# Patient Record
Sex: Male | Born: 1960 | Hispanic: Yes | Marital: Married | Smoking: Current every day smoker
Health system: Southern US, Community
[De-identification: ages and names within clinical notes are randomized; demographics above are authoritative.]

## PROBLEM LIST (undated history)

## (undated) DIAGNOSIS — J309 Allergic rhinitis, unspecified: Secondary | ICD-10-CM

## (undated) DIAGNOSIS — J45909 Unspecified asthma, uncomplicated: Secondary | ICD-10-CM

## (undated) DIAGNOSIS — M1990 Unspecified osteoarthritis, unspecified site: Secondary | ICD-10-CM

## (undated) DIAGNOSIS — C140 Malignant neoplasm of pharynx, unspecified: Secondary | ICD-10-CM

## (undated) DIAGNOSIS — F319 Bipolar disorder, unspecified: Secondary | ICD-10-CM

## (undated) DIAGNOSIS — B019 Varicella without complication: Secondary | ICD-10-CM

## (undated) DIAGNOSIS — F329 Major depressive disorder, single episode, unspecified: Secondary | ICD-10-CM

## (undated) DIAGNOSIS — M545 Low back pain, unspecified: Secondary | ICD-10-CM

## (undated) DIAGNOSIS — M436 Torticollis: Secondary | ICD-10-CM

## (undated) DIAGNOSIS — R569 Unspecified convulsions: Secondary | ICD-10-CM

## (undated) DIAGNOSIS — G4730 Sleep apnea, unspecified: Secondary | ICD-10-CM

## (undated) DIAGNOSIS — J449 Chronic obstructive pulmonary disease, unspecified: Secondary | ICD-10-CM

## (undated) DIAGNOSIS — M109 Gout, unspecified: Secondary | ICD-10-CM

## (undated) DIAGNOSIS — F32A Depression, unspecified: Secondary | ICD-10-CM

## (undated) HISTORY — DX: Allergic rhinitis, unspecified: J30.9

## (undated) HISTORY — DX: Gout, unspecified: M10.9

## (undated) HISTORY — DX: Depression, unspecified: F32.A

## (undated) HISTORY — PX: ROTATOR CUFF REPAIR: SHX139

## (undated) HISTORY — DX: Unspecified osteoarthritis, unspecified site: M19.90

## (undated) HISTORY — DX: Major depressive disorder, single episode, unspecified: F32.9

## (undated) HISTORY — PX: THROAT SURGERY: SHX803

## (undated) HISTORY — DX: Varicella without complication: B01.9

## (undated) HISTORY — DX: Malignant neoplasm of pharynx, unspecified: C14.0

## (undated) HISTORY — DX: Unspecified asthma, uncomplicated: J45.909

---

## 2013-08-08 DIAGNOSIS — M25519 Pain in unspecified shoulder: Secondary | ICD-10-CM | POA: Diagnosis not present

## 2013-08-13 DIAGNOSIS — M25519 Pain in unspecified shoulder: Secondary | ICD-10-CM | POA: Diagnosis not present

## 2013-08-15 DIAGNOSIS — M25519 Pain in unspecified shoulder: Secondary | ICD-10-CM | POA: Diagnosis not present

## 2013-08-19 DIAGNOSIS — M25519 Pain in unspecified shoulder: Secondary | ICD-10-CM | POA: Diagnosis not present

## 2013-08-22 DIAGNOSIS — M25519 Pain in unspecified shoulder: Secondary | ICD-10-CM | POA: Diagnosis not present

## 2013-08-27 DIAGNOSIS — M25519 Pain in unspecified shoulder: Secondary | ICD-10-CM | POA: Diagnosis not present

## 2013-08-29 DIAGNOSIS — M25519 Pain in unspecified shoulder: Secondary | ICD-10-CM | POA: Diagnosis not present

## 2013-09-03 DIAGNOSIS — M25519 Pain in unspecified shoulder: Secondary | ICD-10-CM | POA: Diagnosis not present

## 2013-09-23 DIAGNOSIS — J069 Acute upper respiratory infection, unspecified: Secondary | ICD-10-CM | POA: Diagnosis not present

## 2013-09-23 DIAGNOSIS — Z6832 Body mass index (BMI) 32.0-32.9, adult: Secondary | ICD-10-CM | POA: Diagnosis not present

## 2013-09-23 DIAGNOSIS — R05 Cough: Secondary | ICD-10-CM | POA: Diagnosis not present

## 2013-09-23 DIAGNOSIS — R059 Cough, unspecified: Secondary | ICD-10-CM | POA: Diagnosis not present

## 2014-01-01 DIAGNOSIS — E785 Hyperlipidemia, unspecified: Secondary | ICD-10-CM | POA: Diagnosis not present

## 2014-01-01 DIAGNOSIS — R319 Hematuria, unspecified: Secondary | ICD-10-CM | POA: Diagnosis not present

## 2014-01-01 DIAGNOSIS — D72829 Elevated white blood cell count, unspecified: Secondary | ICD-10-CM | POA: Diagnosis not present

## 2014-01-01 DIAGNOSIS — R569 Unspecified convulsions: Secondary | ICD-10-CM | POA: Diagnosis not present

## 2014-01-01 DIAGNOSIS — R062 Wheezing: Secondary | ICD-10-CM | POA: Diagnosis not present

## 2014-01-01 DIAGNOSIS — R7301 Impaired fasting glucose: Secondary | ICD-10-CM | POA: Diagnosis not present

## 2014-01-01 DIAGNOSIS — Z125 Encounter for screening for malignant neoplasm of prostate: Secondary | ICD-10-CM | POA: Diagnosis not present

## 2014-01-01 DIAGNOSIS — R3911 Hesitancy of micturition: Secondary | ICD-10-CM | POA: Diagnosis not present

## 2014-01-01 DIAGNOSIS — Z6832 Body mass index (BMI) 32.0-32.9, adult: Secondary | ICD-10-CM | POA: Diagnosis not present

## 2014-01-01 DIAGNOSIS — Z8669 Personal history of other diseases of the nervous system and sense organs: Secondary | ICD-10-CM | POA: Diagnosis not present

## 2014-01-09 DIAGNOSIS — Z6832 Body mass index (BMI) 32.0-32.9, adult: Secondary | ICD-10-CM | POA: Diagnosis not present

## 2014-01-09 DIAGNOSIS — E785 Hyperlipidemia, unspecified: Secondary | ICD-10-CM | POA: Diagnosis not present

## 2014-01-09 DIAGNOSIS — R7301 Impaired fasting glucose: Secondary | ICD-10-CM | POA: Diagnosis not present

## 2014-01-22 DIAGNOSIS — R7309 Other abnormal glucose: Secondary | ICD-10-CM | POA: Diagnosis not present

## 2014-01-22 DIAGNOSIS — H6120 Impacted cerumen, unspecified ear: Secondary | ICD-10-CM | POA: Diagnosis not present

## 2014-02-17 DIAGNOSIS — D3705 Neoplasm of uncertain behavior of pharynx: Secondary | ICD-10-CM | POA: Diagnosis not present

## 2014-02-17 DIAGNOSIS — D3701 Neoplasm of uncertain behavior of lip: Secondary | ICD-10-CM | POA: Diagnosis not present

## 2014-02-21 DIAGNOSIS — K148 Other diseases of tongue: Secondary | ICD-10-CM | POA: Diagnosis not present

## 2014-02-21 DIAGNOSIS — J387 Other diseases of larynx: Secondary | ICD-10-CM | POA: Diagnosis not present

## 2014-03-10 DIAGNOSIS — H9319 Tinnitus, unspecified ear: Secondary | ICD-10-CM | POA: Diagnosis not present

## 2014-03-10 DIAGNOSIS — K148 Other diseases of tongue: Secondary | ICD-10-CM | POA: Diagnosis not present

## 2014-03-10 DIAGNOSIS — H905 Unspecified sensorineural hearing loss: Secondary | ICD-10-CM | POA: Diagnosis not present

## 2014-03-10 DIAGNOSIS — J387 Other diseases of larynx: Secondary | ICD-10-CM | POA: Diagnosis not present

## 2014-03-10 DIAGNOSIS — H903 Sensorineural hearing loss, bilateral: Secondary | ICD-10-CM | POA: Diagnosis not present

## 2014-03-10 DIAGNOSIS — H9190 Unspecified hearing loss, unspecified ear: Secondary | ICD-10-CM | POA: Diagnosis not present

## 2014-04-04 DIAGNOSIS — R059 Cough, unspecified: Secondary | ICD-10-CM | POA: Diagnosis not present

## 2014-04-04 DIAGNOSIS — R05 Cough: Secondary | ICD-10-CM | POA: Diagnosis not present

## 2014-04-04 DIAGNOSIS — R490 Dysphonia: Secondary | ICD-10-CM | POA: Diagnosis not present

## 2014-04-04 DIAGNOSIS — J449 Chronic obstructive pulmonary disease, unspecified: Secondary | ICD-10-CM | POA: Diagnosis not present

## 2014-04-04 DIAGNOSIS — J209 Acute bronchitis, unspecified: Secondary | ICD-10-CM | POA: Diagnosis not present

## 2014-04-11 DIAGNOSIS — J449 Chronic obstructive pulmonary disease, unspecified: Secondary | ICD-10-CM | POA: Diagnosis not present

## 2014-04-11 DIAGNOSIS — G2581 Restless legs syndrome: Secondary | ICD-10-CM | POA: Diagnosis not present

## 2014-04-11 DIAGNOSIS — F319 Bipolar disorder, unspecified: Secondary | ICD-10-CM | POA: Diagnosis not present

## 2014-04-11 DIAGNOSIS — G4733 Obstructive sleep apnea (adult) (pediatric): Secondary | ICD-10-CM | POA: Diagnosis not present

## 2014-04-11 DIAGNOSIS — E669 Obesity, unspecified: Secondary | ICD-10-CM | POA: Diagnosis not present

## 2014-04-18 DIAGNOSIS — R498 Other voice and resonance disorders: Secondary | ICD-10-CM | POA: Diagnosis not present

## 2014-04-18 DIAGNOSIS — J019 Acute sinusitis, unspecified: Secondary | ICD-10-CM | POA: Diagnosis not present

## 2014-04-18 DIAGNOSIS — R070 Pain in throat: Secondary | ICD-10-CM | POA: Diagnosis not present

## 2014-04-18 DIAGNOSIS — D101 Benign neoplasm of tongue: Secondary | ICD-10-CM | POA: Diagnosis not present

## 2014-04-30 DIAGNOSIS — M47817 Spondylosis without myelopathy or radiculopathy, lumbosacral region: Secondary | ICD-10-CM | POA: Diagnosis not present

## 2014-04-30 DIAGNOSIS — J439 Emphysema, unspecified: Secondary | ICD-10-CM | POA: Diagnosis not present

## 2014-04-30 DIAGNOSIS — M542 Cervicalgia: Secondary | ICD-10-CM | POA: Diagnosis not present

## 2014-05-01 DIAGNOSIS — J449 Chronic obstructive pulmonary disease, unspecified: Secondary | ICD-10-CM | POA: Diagnosis not present

## 2014-05-05 DIAGNOSIS — C029 Malignant neoplasm of tongue, unspecified: Secondary | ICD-10-CM | POA: Diagnosis not present

## 2014-05-05 DIAGNOSIS — K219 Gastro-esophageal reflux disease without esophagitis: Secondary | ICD-10-CM | POA: Diagnosis not present

## 2014-05-28 DIAGNOSIS — E669 Obesity, unspecified: Secondary | ICD-10-CM | POA: Diagnosis not present

## 2014-05-28 DIAGNOSIS — Z01811 Encounter for preprocedural respiratory examination: Secondary | ICD-10-CM | POA: Diagnosis not present

## 2014-05-28 DIAGNOSIS — J449 Chronic obstructive pulmonary disease, unspecified: Secondary | ICD-10-CM | POA: Diagnosis not present

## 2014-05-28 DIAGNOSIS — G4733 Obstructive sleep apnea (adult) (pediatric): Secondary | ICD-10-CM | POA: Diagnosis not present

## 2014-09-02 DIAGNOSIS — G4733 Obstructive sleep apnea (adult) (pediatric): Secondary | ICD-10-CM | POA: Diagnosis not present

## 2014-09-02 DIAGNOSIS — Z87891 Personal history of nicotine dependence: Secondary | ICD-10-CM | POA: Diagnosis not present

## 2014-09-02 DIAGNOSIS — Z01811 Encounter for preprocedural respiratory examination: Secondary | ICD-10-CM | POA: Diagnosis not present

## 2014-09-02 DIAGNOSIS — J449 Chronic obstructive pulmonary disease, unspecified: Secondary | ICD-10-CM | POA: Diagnosis not present

## 2014-12-24 DIAGNOSIS — M791 Myalgia: Secondary | ICD-10-CM | POA: Diagnosis not present

## 2015-10-07 ENCOUNTER — Encounter: Payer: Self-pay | Admitting: Family Medicine

## 2015-10-07 ENCOUNTER — Ambulatory Visit (INDEPENDENT_AMBULATORY_CARE_PROVIDER_SITE_OTHER): Payer: Medicare Other | Admitting: Family Medicine

## 2015-10-07 VITALS — BP 112/68 | HR 84 | Temp 97.9°F | Ht 68.5 in | Wt 210.0 lb

## 2015-10-07 DIAGNOSIS — F419 Anxiety disorder, unspecified: Secondary | ICD-10-CM

## 2015-10-07 DIAGNOSIS — R252 Cramp and spasm: Secondary | ICD-10-CM | POA: Diagnosis not present

## 2015-10-07 DIAGNOSIS — R1310 Dysphagia, unspecified: Secondary | ICD-10-CM | POA: Diagnosis not present

## 2015-10-07 DIAGNOSIS — K219 Gastro-esophageal reflux disease without esophagitis: Secondary | ICD-10-CM

## 2015-10-07 DIAGNOSIS — M542 Cervicalgia: Secondary | ICD-10-CM

## 2015-10-07 DIAGNOSIS — R490 Dysphonia: Secondary | ICD-10-CM

## 2015-10-07 LAB — CBC
HCT: 43.2 % (ref 39.0–52.0)
HEMOGLOBIN: 14.8 g/dL (ref 13.0–17.0)
MCHC: 34.3 g/dL (ref 30.0–36.0)
MCV: 88.8 fl (ref 78.0–100.0)
Platelets: 290 10*3/uL (ref 150.0–400.0)
RBC: 4.86 Mil/uL (ref 4.22–5.81)
RDW: 12.3 % (ref 11.5–15.5)
WBC: 8.4 10*3/uL (ref 4.0–10.5)

## 2015-10-07 LAB — COMPREHENSIVE METABOLIC PANEL
ALK PHOS: 77 U/L (ref 39–117)
ALT: 24 U/L (ref 0–53)
AST: 39 U/L — ABNORMAL HIGH (ref 0–37)
Albumin: 4.2 g/dL (ref 3.5–5.2)
BILIRUBIN TOTAL: 0.4 mg/dL (ref 0.2–1.2)
BUN: 10 mg/dL (ref 6–23)
CALCIUM: 8.8 mg/dL (ref 8.4–10.5)
CO2: 29 meq/L (ref 19–32)
Chloride: 103 mEq/L (ref 96–112)
Creatinine, Ser: 0.96 mg/dL (ref 0.40–1.50)
GFR: 85.79 mL/min (ref >60.00)
Glucose, Bld: 97 mg/dL (ref 70–99)
Potassium: 4.1 mEq/L (ref 3.5–5.1)
Sodium: 138 mEq/L (ref 135–145)
Total Protein: 6.7 g/dL (ref 6.0–8.3)

## 2015-10-07 LAB — TSH: TSH: 0.53 u[IU]/mL (ref 0.35–4.50)

## 2015-10-07 LAB — MAGNESIUM: Magnesium: 2.4 mg/dL (ref 1.5–2.5)

## 2015-10-07 MED ORDER — PANTOPRAZOLE SODIUM 40 MG PO TBEC: 40.0000 mg | DELAYED_RELEASE_TABLET | Freq: Two times a day (BID) | ORAL | Status: DC

## 2015-10-07 NOTE — Patient Instructions (Addendum)
Nice to meet you. We are going to request records from the [facility] to see if he can get copies of your x-ray of your neck. Excellent we are going to refer you to your nose and throat for evaluation of your hoarseness. We're going to change her reflux medicine to Protonix twice daily. Please avoid foods that cause her reflux. You can take Tylenol for her neck discomfort. Patient was initially advised to take ibuprofen, though he reported prior GI issues with this and he has reflux so he is advised not to take this. If you develop inability to swallow, chest pain, shortness of breath, numbness, weakness, worsening pain, or any new or changing symptoms please seek medical attention.

## 2015-10-07 NOTE — Progress Notes (Signed)
Pre visit review using our clinic review tool, if applicable. No additional management support is needed unless otherwise documented below in the visit note. 

## 2015-10-08 ENCOUNTER — Encounter: Payer: Self-pay | Admitting: Family Medicine

## 2015-10-08 DIAGNOSIS — M542 Cervicalgia: Secondary | ICD-10-CM

## 2015-10-08 DIAGNOSIS — F411 Generalized anxiety disorder: Secondary | ICD-10-CM | POA: Insufficient documentation

## 2015-10-08 DIAGNOSIS — R490 Dysphonia: Secondary | ICD-10-CM | POA: Insufficient documentation

## 2015-10-08 DIAGNOSIS — F419 Anxiety disorder, unspecified: Secondary | ICD-10-CM | POA: Insufficient documentation

## 2015-10-08 DIAGNOSIS — K219 Gastro-esophageal reflux disease without esophagitis: Secondary | ICD-10-CM | POA: Insufficient documentation

## 2015-10-08 DIAGNOSIS — R252 Cramp and spasm: Secondary | ICD-10-CM | POA: Insufficient documentation

## 2015-10-08 DIAGNOSIS — G8929 Other chronic pain: Secondary | ICD-10-CM | POA: Insufficient documentation

## 2015-10-08 HISTORY — DX: Generalized anxiety disorder: F41.1

## 2015-10-08 HISTORY — DX: Gastro-esophageal reflux disease without esophagitis: K21.9

## 2015-10-08 HISTORY — DX: Cervicalgia: M54.2

## 2015-10-08 HISTORY — DX: Other chronic pain: G89.29

## 2015-10-08 NOTE — Assessment & Plan Note (Signed)
Advise stay well hydrated. We'll check lab work to evaluate this further.

## 2015-10-08 NOTE — Assessment & Plan Note (Addendum)
Patient had persistent and worsening hoarseness of his voice over the last 6-7 months. He notes some intermittent sensation of food sticking as well. He does have what appears to be uncontrolled reflux that could be contributing to this. More concerning is his report of prior cancer in his throat and smoking history. There are no obvious abnormalities today on exam, other than that he is hoarse. I discussed obtaining imaging to evaluate further, though we decided to refer to ENT for further evaluation of this issue. He is given return precautions.

## 2015-10-08 NOTE — Assessment & Plan Note (Signed)
This appears to be uncontrolled on his current regimen. Sounds as though he is taking Prilosec for this. Benign abdominal exam. We will refer to GI given his sensation of food sticking and uncontrolled reflux. We will change him to Protonix 40 mg twice daily. He will continue to monitor. He is given return precautions.

## 2015-10-08 NOTE — Assessment & Plan Note (Signed)
Well-controlled. Managed by the [facility]. Patient will continue to follow with them. He is given return precautions.

## 2015-10-08 NOTE — Assessment & Plan Note (Addendum)
Patient with a number of months of neck pain. Benign exam today. Neurologically intact. We will request the records from the [facility] regarding this. His neck discomfort could be coming from musculoskeletal cause or given his hoarseness could be coming from potential throat issue. Tylenol for discomfort. He'll continue to monitor. He is given return precautions.

## 2015-10-08 NOTE — Progress Notes (Signed)
Patient ID: Patrick Gregory, male   DOB: 04/04/1961, 55 y.o.   MRN: [id number]  [name], MD Phone: [phone]  Patrick Gregory is a 55 y.o. male who presents today for new patient visit.  Patient notes for the last 6-7 months he's had issues with hoarseness. He has also been coughing frequently. He notes some lateral neck pain on the left side with this. Sore throat as well. Cough is nonproductive. Notes occasionally will have pain radiating down his right arm from his neck. No numbness or weakness or tingling. He notes some mild sticking sensation on swallowing. No regurgitation. He is always able to swallow fully. He does have a history of reflux and takes some type of PPI of which she is taking 40 mg twice daily. He does get reflux symptoms at night. He notes no chest pain or shortness of breath with any of this. He does report a history of throat cancer in 2007. He does have a history of smoking. He notes muscle cramps throughout his body at random times and locations. He reports having had an x-ray of his neck done at the [facility] about a month ago of which he has not gotten the records yet. He does note he has been advised by the [facility] that he would need neck surgery though he does not want to go through with this at this time.  Anxiety/bipolar disorder: Patient notes this is stable on his current medication regimen which she does not know the name of medications. [facility] He denies SI and HI.  Active Ambulatory Problems    Diagnosis Date Noted  . Hoarseness 10/08/2015  . GERD (gastroesophageal reflux disease) 10/08/2015  . Neck pain 10/08/2015  . Anxiety 10/08/2015  . Muscle cramps 10/08/2015   Resolved Ambulatory Problems    Diagnosis Date Noted  . No Resolved Ambulatory Problems   Past Medical History  Diagnosis Date  . Asthma   . Arthritis   . Throat cancer ([place])   . Chickenpox   . Depression   . Allergic rhinitis     Family History  Problem Relation Age of Onset  .  Alcoholism    . Arthritis    . Hyperlipidemia    . Heart disease    . Sudden death    . Kidney disease    . Mental illness      Social History   Social History  . Marital Status: Single    Spouse Name: N/A  . Number of Children: N/A  . Years of Education: N/A   Occupational History  . Not on file.   Social History Main Topics  . Smoking status: Former Research scientist (life sciences)  . Smokeless tobacco: Not on file  . Alcohol Use: No  . Drug Use: Yes  . Sexual Activity: Not on file   Other Topics Concern  . Not on file   Social History Narrative    ROS   General:  Negative for nexplained weight loss, fever Skin: Negative for new or changing mole, sore that won't heal HEENT: Negative for trouble hearing, trouble seeing, ringing in ears, mouth sores, hoarseness, change in voice, dysphagia. CV:  Negative for chest pain, dyspnea, edema, palpitations Resp: Positive for cough, negative for dyspnea, hemoptysis GI: Negative for nausea, vomiting, diarrhea, constipation, abdominal pain, melena, hematochezia. GU: Positive for sexual difficulty, Negative for dysuria, incontinence, urinary hesitance, hematuria, vaginal or penile discharge, polyuria, lumps in testicle or breasts MSK: Positive for muscle cramps, Negative for muscle aches, joint  pain or swelling Neuro: Negative for headaches, weakness, numbness, dizziness, passing out/fainting Psych: Positive for anxiety, stress, Negative for depression, memory problems  Objective  Physical Exam Filed Vitals:   10/07/15 1020  BP: 112/68  Pulse: 84  Temp: 97.9 F (36.6 C)    BP Readings from Last 3 Encounters:  10/07/15 112/68   Wt Readings from Last 3 Encounters:  10/07/15 210 lb (95.255 kg)    Physical Exam  Constitutional: He is well-developed, well-nourished, and in no distress.  HENT:  Head: Normocephalic and atraumatic.  Right Ear: External ear normal.  Left Ear: External ear normal.  Mouth/Throat: Oropharynx is clear and moist. No  oropharyngeal exudate.  Eyes: Conjunctivae are normal. Pupils are equal, round, and reactive to light.  Neck: Normal range of motion. Neck supple.  Mild left-sided muscular neck tenderness, no swelling or cervical adenopathy, no masses palpated  Cardiovascular: Normal rate, regular rhythm and normal heart sounds.   Pulmonary/Chest: Effort normal and breath sounds normal.  Abdominal: Soft. Bowel sounds are normal. He exhibits no distension. There is no tenderness. There is no rebound and no guarding.  Musculoskeletal:  No midline spine tenderness, no midline spine step-off, no muscular back tenderness  Lymphadenopathy:    He has no cervical adenopathy.  Neurological: He is alert.  CN 2-12 intact, 5/5 strength in bilateral biceps, triceps, grip, quads, hamstrings, plantar and dorsiflexion, sensation to light touch intact in bilateral UE and LE, normal gait, 2+ patellar reflexes  Skin: Skin is warm and dry. He is not diaphoretic.  Psychiatric: Mood and affect normal.     Assessment/Plan:   Hoarseness Patient had persistent and worsening hoarseness of his voice over the last 6-7 months. He notes some intermittent sensation of food sticking as well. He does have what appears to be uncontrolled reflux that could be contributing to this. More concerning is his report of prior cancer in his throat and smoking history. There are no obvious abnormalities today on exam, other than that he is hoarse. I discussed obtaining imaging to evaluate further, though we decided to refer to ENT for further evaluation of this issue. He is given return precautions.  GERD (gastroesophageal reflux disease) This appears to be uncontrolled on his current regimen. Sounds as though he is taking Prilosec for this. Benign abdominal exam. We will refer to GI given his sensation of food sticking and uncontrolled reflux. We will change him to Protonix 40 mg twice daily. He will continue to monitor. He is given return  precautions.  Neck pain Patient with a number of months of neck pain. Benign exam today. Neurologically intact. We will request the records from the [facility] regarding this. His neck discomfort could be coming from musculoskeletal cause or given his hoarseness could be coming from potential throat issue. Tylenol for discomfort. He'll continue to monitor. He is given return precautions.  Anxiety Well-controlled. Managed by the [facility]. Patient will continue to follow with them. He is given return precautions.  Muscle cramps Advise stay well hydrated. We'll check lab work to evaluate this further.    Orders Placed This Encounter  Procedures  . Comp Met (CMET)  . Magnesium  . TSH  . CBC  . Ambulatory referral to ENT    Referral Priority:  Routine    Referral Type:  Consultation    Referral Reason:  Specialty Services Required    Requested Specialty:  Otolaryngology    Number of Visits Requested:  1  . Ambulatory referral to Gastroenterology  Referral Priority:  Routine    Referral Type:  Consultation    Referral Reason:  Specialty Services Required    Number of Visits Requested:  1    Meds ordered this encounter  Medications  . DISCONTD: pantoprazole (PROTONIX) 40 MG tablet    Sig: Take 1 tablet (40 mg total) by mouth 2 (two) times daily before a meal.    Dispense:  30 tablet    Refill:  3  . pantoprazole (PROTONIX) 40 MG tablet    Sig: Take 1 tablet (40 mg total) by mouth 2 (two) times daily before a meal.    Dispense:  60 tablet    Refill:  [place], MD [place]

## 2015-10-14 ENCOUNTER — Encounter: Payer: Self-pay | Admitting: Surgical

## 2015-10-16 ENCOUNTER — Other Ambulatory Visit: Payer: TRICARE For Life (TFL)

## 2015-10-19 ENCOUNTER — Telehealth: Payer: Self-pay | Admitting: *Deleted

## 2015-10-19 ENCOUNTER — Other Ambulatory Visit (INDEPENDENT_AMBULATORY_CARE_PROVIDER_SITE_OTHER): Payer: Medicare Other

## 2015-10-19 DIAGNOSIS — R945 Abnormal results of liver function studies: Principal | ICD-10-CM

## 2015-10-19 DIAGNOSIS — R7989 Other specified abnormal findings of blood chemistry: Secondary | ICD-10-CM

## 2015-10-19 NOTE — Telephone Encounter (Signed)
Orders placed.

## 2015-10-19 NOTE — Telephone Encounter (Signed)
Labs and dx?  

## 2015-10-20 DIAGNOSIS — R490 Dysphonia: Secondary | ICD-10-CM | POA: Diagnosis not present

## 2015-10-20 DIAGNOSIS — D380 Neoplasm of uncertain behavior of larynx: Secondary | ICD-10-CM | POA: Diagnosis not present

## 2015-10-20 LAB — COMPREHENSIVE METABOLIC PANEL WITH GFR
ALT: 15 U/L (ref 0–53)
AST: 23 U/L (ref 0–37)
Albumin: 4.4 g/dL (ref 3.5–5.2)
Alkaline Phosphatase: 72 U/L (ref 39–117)
BUN: 9 mg/dL (ref 6–23)
CO2: 32 meq/L (ref 19–32)
Calcium: 9.3 mg/dL (ref 8.4–10.5)
Chloride: 102 meq/L (ref 96–112)
Creatinine, Ser: 1.07 mg/dL (ref 0.40–1.50)
GFR: 75.68 mL/min
Glucose, Bld: 71 mg/dL (ref 70–99)
Potassium: 4.4 meq/L (ref 3.5–5.1)
Sodium: 138 meq/L (ref 135–145)
Total Bilirubin: 0.4 mg/dL (ref 0.2–1.2)
Total Protein: 7.2 g/dL (ref 6.0–8.3)

## 2015-10-21 ENCOUNTER — Encounter: Payer: Self-pay | Admitting: *Deleted

## 2015-10-21 ENCOUNTER — Encounter: Payer: Self-pay | Admitting: Family Medicine

## 2015-10-22 NOTE — Discharge Instructions (Signed)

## 2015-10-23 ENCOUNTER — Encounter
Admission: RE | Disposition: A | Discharge status: Home / Self Care | Payer: Self-pay | Source: Ambulatory Visit | Attending: Unknown Physician Specialty

## 2015-10-23 ENCOUNTER — Ambulatory Visit
Admission: RE | Admit: 2015-10-23 | Discharge: 2015-10-23 | Disposition: A | Discharge status: Home / Self Care | Payer: Medicare Other | Source: Ambulatory Visit | Attending: Unknown Physician Specialty | Admitting: Unknown Physician Specialty

## 2015-10-23 ENCOUNTER — Ambulatory Visit: Payer: Medicare Other | Admitting: Anesthesiology

## 2015-10-23 DIAGNOSIS — R490 Dysphonia: Secondary | ICD-10-CM | POA: Diagnosis not present

## 2015-10-23 DIAGNOSIS — K219 Gastro-esophageal reflux disease without esophagitis: Secondary | ICD-10-CM | POA: Diagnosis not present

## 2015-10-23 DIAGNOSIS — Z811 Family history of alcohol abuse and dependence: Secondary | ICD-10-CM | POA: Diagnosis not present

## 2015-10-23 DIAGNOSIS — R569 Unspecified convulsions: Secondary | ICD-10-CM | POA: Diagnosis not present

## 2015-10-23 DIAGNOSIS — J382 Nodules of vocal cords: Secondary | ICD-10-CM | POA: Diagnosis not present

## 2015-10-23 DIAGNOSIS — J381 Polyp of vocal cord and larynx: Secondary | ICD-10-CM | POA: Diagnosis not present

## 2015-10-23 DIAGNOSIS — G4730 Sleep apnea, unspecified: Secondary | ICD-10-CM | POA: Diagnosis not present

## 2015-10-23 DIAGNOSIS — Z818 Family history of other mental and behavioral disorders: Secondary | ICD-10-CM | POA: Diagnosis not present

## 2015-10-23 DIAGNOSIS — Z7722 Contact with and (suspected) exposure to environmental tobacco smoke (acute) (chronic): Secondary | ICD-10-CM | POA: Diagnosis not present

## 2015-10-23 DIAGNOSIS — J383 Other diseases of vocal cords: Secondary | ICD-10-CM | POA: Diagnosis not present

## 2015-10-23 DIAGNOSIS — Z87891 Personal history of nicotine dependence: Secondary | ICD-10-CM | POA: Diagnosis not present

## 2015-10-23 DIAGNOSIS — Z8249 Family history of ischemic heart disease and other diseases of the circulatory system: Secondary | ICD-10-CM | POA: Diagnosis not present

## 2015-10-23 HISTORY — DX: Chronic obstructive pulmonary disease, unspecified: J44.9

## 2015-10-23 HISTORY — PX: DIRECT LARYNGOSCOPY: SHX5326

## 2015-10-23 HISTORY — DX: Bipolar disorder, unspecified: F31.9

## 2015-10-23 HISTORY — DX: Torticollis: M43.6

## 2015-10-23 HISTORY — DX: Unspecified convulsions: R56.9

## 2015-10-23 HISTORY — DX: Low back pain: M54.5

## 2015-10-23 HISTORY — DX: Sleep apnea, unspecified: G47.30

## 2015-10-23 HISTORY — DX: Low back pain, unspecified: M54.50

## 2015-10-23 SURGERY — LARYNGOSCOPY, DIRECT
Anesthesia: General | Site: Throat | Laterality: Right | Wound class: Clean Contaminated

## 2015-10-23 MED ORDER — ALBUTEROL SULFATE HFA 108 (90 BASE) MCG/ACT IN AERS
INHALATION_SPRAY | RESPIRATORY_TRACT | Status: DC | PRN
  Administered 2015-10-23: 4 via RESPIRATORY_TRACT

## 2015-10-23 MED ORDER — LIDOCAINE HCL (CARDIAC) 20 MG/ML IV SOLN
INTRAVENOUS | Status: DC | PRN
  Administered 2015-10-23: 50 mg via INTRAVENOUS

## 2015-10-23 MED ORDER — ACETAMINOPHEN 160 MG/5ML PO SOLN: 325.0000 mg | ORAL | Status: DC | PRN

## 2015-10-23 MED ORDER — ONDANSETRON HCL 4 MG/2ML IJ SOLN
INTRAMUSCULAR | Status: DC | PRN
  Administered 2015-10-23: 4 mg via INTRAVENOUS

## 2015-10-23 MED ORDER — FENTANYL CITRATE (PF) 100 MCG/2ML IJ SOLN: 25.0000 ug | INTRAMUSCULAR | Status: DC | PRN

## 2015-10-23 MED ORDER — LACTATED RINGERS IV SOLN: 500.0000 mL | INTRAVENOUS | Status: DC

## 2015-10-23 MED ORDER — SUCCINYLCHOLINE CHLORIDE 20 MG/ML IJ SOLN
INTRAMUSCULAR | Status: DC | PRN
  Administered 2015-10-23: 100 mg via INTRAVENOUS

## 2015-10-23 MED ORDER — LACTATED RINGERS IV SOLN
INTRAVENOUS | Status: DC
  Administered 2015-10-23 (×2): via INTRAVENOUS

## 2015-10-23 MED ORDER — ROCURONIUM BROMIDE 100 MG/10ML IV SOLN
INTRAVENOUS | Status: DC | PRN
  Administered 2015-10-23: 15 mg via INTRAVENOUS

## 2015-10-23 MED ORDER — ACETAMINOPHEN 325 MG PO TABS: 325.0000 mg | ORAL_TABLET | ORAL | Status: DC | PRN

## 2015-10-23 MED ORDER — OXYCODONE HCL 5 MG PO TABS: 5.0000 mg | ORAL_TABLET | Freq: Once | ORAL | Status: AC | PRN

## 2015-10-23 MED ORDER — PROPOFOL 10 MG/ML IV BOLUS
INTRAVENOUS | Status: DC | PRN
  Administered 2015-10-23: 50 mg via INTRAVENOUS
  Administered 2015-10-23: 150 mg via INTRAVENOUS
  Administered 2015-10-23: 50 mg via INTRAVENOUS

## 2015-10-23 MED ORDER — ONDANSETRON HCL 4 MG/2ML IJ SOLN: 4.0000 mg | Freq: Once | INTRAMUSCULAR | Status: DC | PRN

## 2015-10-23 MED ORDER — OXYCODONE HCL 5 MG/5ML PO SOLN
5.0000 mg | Freq: Once | ORAL | Status: AC | PRN
  Administered 2015-10-23: 5 mg via ORAL

## 2015-10-23 MED ORDER — NEOSTIGMINE METHYLSULFATE 10 MG/10ML IV SOLN
INTRAVENOUS | Status: DC | PRN
  Administered 2015-10-23: 3 mg via INTRAVENOUS

## 2015-10-23 MED ORDER — MIDAZOLAM HCL 5 MG/5ML IJ SOLN
INTRAMUSCULAR | Status: DC | PRN
  Administered 2015-10-23: 2 mg via INTRAVENOUS

## 2015-10-23 MED ORDER — GLYCOPYRROLATE 0.2 MG/ML IJ SOLN
INTRAMUSCULAR | Status: DC | PRN
  Administered 2015-10-23: 0.2 mg via INTRAVENOUS
  Administered 2015-10-23: .4 mg via INTRAVENOUS

## 2015-10-23 MED ORDER — PHENYLEPHRINE HCL 0.5 % NA SOLN
NASAL | Status: DC | PRN
  Administered 2015-10-23: 30 mL via TOPICAL

## 2015-10-23 MED ORDER — LIDOCAINE HCL 4 % MT SOLN
OROMUCOSAL | Status: DC | PRN
  Administered 2015-10-23: 4 mL via TOPICAL

## 2015-10-23 MED ORDER — DEXAMETHASONE SODIUM PHOSPHATE 4 MG/ML IJ SOLN
INTRAMUSCULAR | Status: DC | PRN
  Administered 2015-10-23: 4 mg via INTRAVENOUS

## 2015-10-23 MED ORDER — OXYCODONE-ACETAMINOPHEN 5-325 MG PO TABS: 1.0000 | ORAL_TABLET | ORAL | Status: DC | PRN

## 2015-10-23 SURGICAL SUPPLY — 19 items
BLOCK BITE GUARD (MISCELLANEOUS) ×3 IMPLANT
COVER MAYO STAND STRL (DRAPES) ×3 IMPLANT
COVER TABLE BACK 60X90 (DRAPES) ×3 IMPLANT
CUP MEDICINE 2OZ PLAST GRAD ST (MISCELLANEOUS) ×3 IMPLANT
DRESSING TELFA 4X3 1S ST N-ADH (GAUZE/BANDAGES/DRESSINGS) ×3 IMPLANT
GLOVE BIO SURGEON STRL SZ7.5 (GLOVE) ×6 IMPLANT
KIT ROOM TURNOVER OR (KITS) ×3 IMPLANT
MARKER SKIN DUAL TIP RULER LAB (MISCELLANEOUS) ×3 IMPLANT
NEEDLE FILTER BLUNT 18X 1/2SAF (NEEDLE) ×2
NEEDLE FILTER BLUNT 18X1 1/2 (NEEDLE) ×1 IMPLANT
NS IRRIG 500ML POUR BTL (IV SOLUTION) ×3 IMPLANT
PATTIES SURGICAL .5 X.5 (GAUZE/BANDAGES/DRESSINGS) ×3 IMPLANT
SOL ANTI-FOG 6CC FOG-OUT (MISCELLANEOUS) ×1 IMPLANT
SOL FOG-OUT ANTI-FOG 6CC (MISCELLANEOUS) ×2
SPONGE XRAY 4X4 16PLY STRL (MISCELLANEOUS) ×3 IMPLANT
STRAP BODY AND KNEE 60X3 (MISCELLANEOUS) ×6 IMPLANT
TOWEL OR 17X26 4PK STRL BLUE (TOWEL DISPOSABLE) ×3 IMPLANT
TUBING CONN 6MMX3.1M (TUBING) ×2
TUBING SUCTION CONN 0.25 STRL (TUBING) ×1 IMPLANT

## 2015-10-23 NOTE — Transfer of Care (Signed)
Immediate Anesthesia Transfer of Care Note  Patient: Patrick Gregory  Procedure(s) Performed: Procedure(s): MICO DIRECT LARYNGOSCOPY WITH EXCISION RIGHT VOCAL CORD LESION (Right)  Patient Location: PACU  Anesthesia Type: General  Level of Consciousness: awake, alert  and patient cooperative  Airway and Oxygen Therapy: Patient Spontanous Breathing and Patient connected to supplemental oxygen  Post-op Assessment: Post-op Vital signs reviewed, Patient's Cardiovascular Status Stable, Respiratory Function Stable, Patent Airway and No signs of Nausea or vomiting  Post-op Vital Signs: Reviewed and stable  Complications: No apparent anesthesia complications

## 2015-10-23 NOTE — Anesthesia Postprocedure Evaluation (Signed)
Anesthesia Post Note  Patient: Patrick Gregory  Procedure(s) Performed: Procedure(s) (LRB): MICO DIRECT LARYNGOSCOPY WITH EXCISION RIGHT VOCAL CORD LESION (Right)  Patient location during evaluation: PACU Anesthesia Type: General Level of consciousness: awake and alert Pain management: pain level controlled Vital Signs Assessment: post-procedure vital signs reviewed and stable Respiratory status: spontaneous breathing, nonlabored ventilation and respiratory function stable Cardiovascular status: blood pressure returned to baseline and stable Postop Assessment: no signs of nausea or vomiting Anesthetic complications: no    DANIEL D KOVACS

## 2015-10-23 NOTE — Op Note (Signed)
10/23/2015  1:45 PM    Brenham, Rayner  US:[id number]   Pre-Op Dx: Right vocal cord mass  Post-op Dx: SAME  Proc: Microlaryngoscopy with excision of right vocal cord mass   Surg:  [facility]ly Gust T  Anes:  GOT  EBL:  Less than 5 cc  Comp:  None  Findings:  Exophytic mass right vocal fold  Procedure: Aelred was identified in the holding area taken the operating room and placed in supine position. After general endotracheal anesthesia the table was turned 90. A tooth guard was then placed. A Dedo laryngoscope was introduced into the airway and suspended. Examination with a 0 Hopkins rod showed a large exophytic mass of the right vocal fold. This was photo documented. A cottonoid pledget with phenylephrine lidocaine solution was placed over the mass approximate 3 minutes. This was then removed. The operating microscope was brought into the field as were the [place]. A cup forcep was then used to gently grasp the mass and the [place] scissors were then used to excise it from the underlying mucosa. Care was taken to avoid the vocal ligament. With the mass removed cottonoid pledgets were then used to bathe the larynx again with a phenylephrine lidocaine solution approximately 3 minutes this was removed. With minimal bleeding photodocumentation was performed following excision of the mass. The patient was then awakened in the operating room and taken recovery room in stable condition  Cultures: None  Past: Right vocal cord mass  Dispo:   Good  Plan:  Discharge to home follow-up 1 week  Omare Bilotta T  10/23/2015 1:45 PM

## 2015-10-23 NOTE — H&P (Signed)
  H+P  Reviewed and will be scanned in later. No changes noted. 

## 2015-10-23 NOTE — Anesthesia Preprocedure Evaluation (Addendum)
Anesthesia Evaluation  Patient identified by MRN, date of birth, ID band Patient awake    Reviewed: Allergy & Precautions, H&P , NPO status , Patient's Chart, lab work & pertinent test results, reviewed documented beta blocker date and time   Airway Mallampati: III  TM Distance: >3 FB Neck ROM: limited    Dental no notable dental hx.    Pulmonary asthma , sleep apnea and Continuous Positive Airway Pressure Ventilation , COPD,  COPD inhaler, former smoker,    Pulmonary exam normal breath sounds clear to auscultation       Cardiovascular Exercise Tolerance: Good negative cardio ROS   Rhythm:regular Rate:Normal     Neuro/Psych Seizures -, Well Controlled,  PSYCHIATRIC DISORDERS    GI/Hepatic Neg liver ROS, GERD  Medicated,  Endo/Other  negative endocrine ROS  Renal/GU negative Renal ROS  negative genitourinary   Musculoskeletal   Abdominal   Peds  Hematology negative hematology ROS (+)   Anesthesia Other Findings   Reproductive/Obstetrics negative OB ROS                            Anesthesia Physical Anesthesia Plan  ASA: II  Anesthesia Plan: General   Post-op Pain Management:    Induction:   Airway Management Planned:   Additional Equipment:   Intra-op Plan:   Post-operative Plan:   Informed Consent: I have reviewed the patients History and Physical, chart, labs and discussed the procedure including the risks, benefits and alternatives for the proposed anesthesia with the patient or authorized representative who has indicated his/her understanding and acceptance.     Plan Discussed with: CRNA  Anesthesia Plan Comments:         Anesthesia Quick Evaluation

## 2015-10-23 NOTE — Anesthesia Procedure Notes (Signed)
Procedure Name: Intubation Date/Time: 10/23/2015 1:23 PM Performed by: Mayme Genta Pre-anesthesia Checklist: Patient identified, Emergency Drugs available, Suction available, Patient being monitored and Timeout performed Patient Re-evaluated:Patient Re-evaluated prior to inductionOxygen Delivery Method: Circle system utilized Preoxygenation: Pre-oxygenation with 100% oxygen Intubation Type: IV induction Ventilation: Mask ventilation without difficulty Laryngoscope Size: Miller and 3 Grade View: Grade I Tube type: MLT Tube size: 6.0 mm Number of attempts: 1 Placement Confirmation: ETT inserted through vocal cords under direct vision,  positive ETCO2 and breath sounds checked- equal and bilateral Tube secured with: Tape Dental Injury: Teeth and Oropharynx as per pre-operative assessment

## 2015-10-26 ENCOUNTER — Encounter: Payer: Self-pay | Admitting: Unknown Physician Specialty

## 2015-10-27 LAB — SURGICAL PATHOLOGY

## 2015-10-30 DIAGNOSIS — J382 Nodules of vocal cords: Secondary | ICD-10-CM | POA: Diagnosis not present

## 2016-01-25 ENCOUNTER — Encounter: Payer: Self-pay | Admitting: Family Medicine

## 2016-01-25 ENCOUNTER — Ambulatory Visit (INDEPENDENT_AMBULATORY_CARE_PROVIDER_SITE_OTHER): Payer: Medicare Other | Admitting: Family Medicine

## 2016-01-25 VITALS — BP 120/74 | HR 93 | Temp 98.3°F | Ht 68.5 in | Wt 199.4 lb

## 2016-01-25 DIAGNOSIS — R202 Paresthesia of skin: Secondary | ICD-10-CM | POA: Diagnosis not present

## 2016-01-25 DIAGNOSIS — R1013 Epigastric pain: Secondary | ICD-10-CM

## 2016-01-25 DIAGNOSIS — M79603 Pain in arm, unspecified: Secondary | ICD-10-CM | POA: Diagnosis not present

## 2016-01-25 DIAGNOSIS — M79602 Pain in left arm: Principal | ICD-10-CM

## 2016-01-25 DIAGNOSIS — M542 Cervicalgia: Secondary | ICD-10-CM

## 2016-01-25 DIAGNOSIS — M79601 Pain in right arm: Secondary | ICD-10-CM

## 2016-01-25 LAB — COMPREHENSIVE METABOLIC PANEL
ALBUMIN: 4.4 g/dL (ref 3.5–5.2)
ALT: 11 U/L (ref 0–53)
AST: 18 U/L (ref 0–37)
Alkaline Phosphatase: 77 U/L (ref 39–117)
BUN: 8 mg/dL (ref 6–23)
CHLORIDE: 102 meq/L (ref 96–112)
CO2: 32 mEq/L (ref 19–32)
Calcium: 9.2 mg/dL (ref 8.4–10.5)
Creatinine, Ser: 1.09 mg/dL (ref 0.40–1.50)
GFR: 74.01 mL/min (ref >60.00)
Glucose, Bld: 100 mg/dL — ABNORMAL HIGH (ref 70–99)
POTASSIUM: 4.2 meq/L (ref 3.5–5.1)
SODIUM: 138 meq/L (ref 135–145)
Total Bilirubin: 0.4 mg/dL (ref 0.2–1.2)
Total Protein: 6.9 g/dL (ref 6.0–8.3)

## 2016-01-25 LAB — CBC
HCT: 47.8 % (ref 39.0–52.0)
HEMOGLOBIN: 16.1 g/dL (ref 13.0–17.0)
MCHC: 33.8 g/dL (ref 30.0–36.0)
MCV: 89.6 fl (ref 78.0–100.0)
PLATELETS: 358 10*3/uL (ref 150.0–400.0)
RBC: 5.34 Mil/uL (ref 4.22–5.81)
RDW: 12.8 % (ref 11.5–15.5)
WBC: 12.1 10*3/uL — AB (ref 4.0–10.5)

## 2016-01-25 LAB — LIPASE: LIPASE: 55 U/L (ref 11.0–59.0)

## 2016-01-25 MED ORDER — HYDROCODONE-ACETAMINOPHEN 5-325 MG PO TABS: 1.0000 | ORAL_TABLET | Freq: Four times a day (QID) | ORAL | Status: DC | PRN

## 2016-01-25 NOTE — Progress Notes (Signed)
Pre visit review using our clinic review tool, if applicable. No additional management support is needed unless otherwise documented below in the visit note. 

## 2016-01-25 NOTE — Patient Instructions (Signed)
Nice to see you. We are going to refer you to a neurosurgeon. We will obtain some lab work to evaluate your abdominal discomfort. You can use the Vicodin as needed for discomfort. If you develop numbness, weakness, loss of bowel or bladder function, numbness between her legs, abdominal pain, bloody stool, or any new or change in symptoms please seek medical attention.

## 2016-01-26 DIAGNOSIS — R1013 Epigastric pain: Secondary | ICD-10-CM | POA: Insufficient documentation

## 2016-01-26 NOTE — Progress Notes (Signed)
Patient ID: Jayd Cadieux, male   DOB: Jan 08, 1961, 55 y.o.   MRN: [id number]  [name], MD Phone: [phone]  Marion Rosenberry is a 55 y.o. male who presents today for same-day visit.  History of chronic neck and back pain. Notes it has gotten worse over the last several weeks. Discomfort in neck and upper back. Notes numbness and pain radiating down into his arms. No weakness. Notes when he lays on his right side with his head tilted he will occasionally get numbness in his face on the left side. This has happened 3-4 times over the last several weeks. Last occurred 2 days ago. If he changes positions the numbness resolves. He has no numbness in the face at any other time. No weakness or numbness in his legs. No saddle anesthesia, loss of bowel or bladder function, or fevers. 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 He had an MRI in March that revealed mild degenerative disc disease and severe right foraminal impingement. Unremarkable cranial cervical junction. No pain relief with Tylenol.  Patient notes over the last several weeks he's had mild epigastric discomfort associated with some nausea. No vomiting or diarrhea. No other abdominal pain. No fevers. Blood in his stool. He takes Protonix.  PMH: Former smoker   ROS see history of present illness  Objective  Physical Exam Filed Vitals:   01/25/16 1313  BP: 120/74  Pulse: 93  Temp: 98.3 F (36.8 C)    Physical Exam  Constitutional: He is well-developed, well-nourished, and in no distress.  HENT:  Head: Normocephalic and atraumatic.  Right Ear: External ear normal.  Left Ear: External ear normal.  Cardiovascular: Normal rate, regular rhythm and normal heart sounds.   Pulmonary/Chest: Effort normal and breath sounds normal.  Abdominal: Soft. Bowel sounds are normal. He exhibits no distension. There is no tenderness. There is no rebound and no guarding.  Musculoskeletal: He exhibits no edema.  Notes mild midline tenderness with more  muscular tenderness in his neck and upper back, no thoracic or low back midline tenderness, no warmth over this area, positive Spurling's bilaterally  Neurological: He is alert.  CN 2-12 intact, 5/5 strength in bilateral biceps, triceps, grip, quads, hamstrings, plantar and dorsiflexion, sensation to light touch intact in bilateral UE and LE, normal gait, 2+ patellar, triceps, and brachioradialis reflexes  Skin: He is not diaphoretic.     Assessment/Plan: Please see individual problem list.  Neck pain With persistent neck pain worsened recently. Recent MRI reviewed revealing a DDD and some nerve impingement. Suspect the numbness that he gets in the left side of his face is likely related to a neck issue given that it only occurs when laying on his right side with his head in a particular position. Doubt central process with this given history. He is neurologically intact at this time. Given that he has previously had an MRI we will refer to neurosurgery given his symptomatology. Will give short course of Vicodin for pain given his history of reflux. He'll continue to monitor. He is given return precautions.  Epigastric discomfort Patient with several week history of epigastric discomfort. He does have GERD so could be related to this or gastritis. He has a benign abdominal exam today. We'll check some basic lab work as outlined below to evaluate for other causes. He'll continue his reflux medications and continue to monitor. He is given return precautions.    Orders Placed This Encounter  Procedures  . Comp Met (CMET)  . Lipase  .  CBC  . Ambulatory referral to Neurosurgery    Referral Priority:  Routine    Referral Type:  Surgical    Referral Reason:  Specialty Services Required    Requested Specialty:  Neurosurgery    Number of Visits Requested:  1    Meds ordered this encounter  Medications  . omeprazole (PRILOSEC) 20 MG capsule    Sig: Take 20 mg by mouth 2 (two) times daily  before a meal.  . ranitidine (ZANTAC) 300 MG tablet    Sig: Take 300 mg by mouth at bedtime.  Marland Kitchen HYDROcodone-acetaminophen (NORCO/VICODIN) 5-325 MG tablet    Sig: Take 1 tablet by mouth every 6 (six) hours as needed for moderate pain.    Dispense:  30 tablet    Refill:  0    [name], MD [place]

## 2016-01-26 NOTE — Assessment & Plan Note (Signed)
Patient with several week history of epigastric discomfort. He does have GERD so could be related to this or gastritis. He has a benign abdominal exam today. We'll check some basic lab work as outlined below to evaluate for other causes. He'll continue his reflux medications and continue to monitor. He is given return precautions.

## 2016-01-26 NOTE — Assessment & Plan Note (Signed)
With persistent neck pain worsened recently. Recent MRI reviewed revealing a DDD and some nerve impingement. Suspect the numbness that he gets in the left side of his face is likely related to a neck issue given that it only occurs when laying on his right side with his head in a particular position. Doubt central process with this given history. He is neurologically intact at this time. Given that he has previously had an MRI we will refer to neurosurgery given his symptomatology. Will give short course of Vicodin for pain given his history of reflux. He'll continue to monitor. He is given return precautions.

## 2016-01-27 ENCOUNTER — Telehealth: Payer: Self-pay

## 2016-01-27 NOTE — Telephone Encounter (Signed)
Pt coming for repeat blood work 01/29/16. Please place orders. Thank you.

## 2016-01-28 ENCOUNTER — Other Ambulatory Visit: Payer: Self-pay | Admitting: Family Medicine

## 2016-01-28 DIAGNOSIS — D72829 Elevated white blood cell count, unspecified: Secondary | ICD-10-CM

## 2016-01-28 NOTE — Telephone Encounter (Signed)
Order placed

## 2016-01-29 ENCOUNTER — Other Ambulatory Visit (INDEPENDENT_AMBULATORY_CARE_PROVIDER_SITE_OTHER): Payer: Medicare Other

## 2016-01-29 DIAGNOSIS — D72829 Elevated white blood cell count, unspecified: Secondary | ICD-10-CM

## 2016-02-05 ENCOUNTER — Telehealth: Payer: Self-pay | Admitting: Family Medicine

## 2016-02-05 NOTE — Telephone Encounter (Addendum)
Pt wife stated that [facility] did not have pt in the system, pt stated that he had a MRI one year ago through the [facility] in [place].  Pt's wife wife stated that the [facility] faxed a copy to this office.  Pt contact [phone]

## 2016-02-05 NOTE — Telephone Encounter (Signed)
Spoke with patients wife and told her they could go to the hospital to pick up a copy of the CD to take to the provider

## 2016-02-05 NOTE — Telephone Encounter (Signed)
Pt needs a copy of his last MRI he has an appt on Monday they told him that he had to bring a copy of it.. Please advise pt

## 2016-02-05 NOTE — Telephone Encounter (Signed)
Please advise 

## 2016-02-05 NOTE — Telephone Encounter (Signed)
Notified patient that they will have to request copy of MRI from the [facility]. We only have the report not the CD

## 2016-02-08 LAB — CBC WITH DIFFERENTIAL/PLATELET
BASOS PCT: 0.2 % (ref 0.0–3.0)
Basophils Absolute: 0 10*3/uL (ref 0.0–0.1)
EOS ABS: 0.2 10*3/uL (ref 0.0–0.7)
EOS PCT: 1.6 % (ref 0.0–5.0)
HEMATOCRIT: 48.6 % (ref 39.0–52.0)
HEMOGLOBIN: 16.6 g/dL (ref 13.0–17.0)
LYMPHS PCT: 28.4 % (ref 12.0–46.0)
Lymphs Abs: 3.4 10*3/uL (ref 0.7–4.0)
MCHC: 34.1 g/dL (ref 30.0–36.0)
MCV: 89.3 fl (ref 78.0–100.0)
Monocytes Absolute: 0.8 10*3/uL (ref 0.1–1.0)
Monocytes Relative: 6.3 % (ref 3.0–12.0)
NEUTROS ABS: 7.6 10*3/uL (ref 1.4–7.7)
Neutrophils Relative %: 63.5 % (ref 43.0–77.0)
PLATELETS: 384 10*3/uL (ref 150.0–400.0)
RBC: 5.44 Mil/uL (ref 4.22–5.81)
RDW: 12.1 % (ref 11.5–15.5)
WBC: 12 10*3/uL — AB (ref 4.0–10.5)

## 2016-02-09 ENCOUNTER — Other Ambulatory Visit: Payer: Self-pay | Admitting: *Deleted

## 2016-02-09 DIAGNOSIS — R490 Dysphonia: Secondary | ICD-10-CM | POA: Diagnosis not present

## 2016-02-09 DIAGNOSIS — J449 Chronic obstructive pulmonary disease, unspecified: Secondary | ICD-10-CM | POA: Diagnosis not present

## 2016-02-09 NOTE — Telephone Encounter (Signed)
Please advise refill, last filled on 01/25/16 #30 with no refills, take 1 tablet every 6 hours as needed. thanks

## 2016-02-09 NOTE — Telephone Encounter (Signed)
Patient has requested a medication refill for Hydrocodone Pt contact [phone]

## 2016-02-10 MED ORDER — HYDROCODONE-ACETAMINOPHEN 5-325 MG PO TABS: 1.0000 | ORAL_TABLET | Freq: Four times a day (QID) | ORAL | Status: DC | PRN

## 2016-02-10 NOTE — Telephone Encounter (Signed)
One refill will be given. If he continues to need this in the future he will need to be referred to pain management.

## 2016-02-10 NOTE — Telephone Encounter (Signed)
Notified patient it was up front to pick up.  He wanted it to hold him over until he gets his shots in his back, thanks

## 2016-02-11 ENCOUNTER — Encounter: Payer: Self-pay | Admitting: Internal Medicine

## 2016-02-11 ENCOUNTER — Ambulatory Visit (INDEPENDENT_AMBULATORY_CARE_PROVIDER_SITE_OTHER): Payer: Medicare Other | Admitting: Internal Medicine

## 2016-02-11 VITALS — BP 122/64 | HR 82 | Ht 69.0 in | Wt 199.0 lb

## 2016-02-11 DIAGNOSIS — R0609 Other forms of dyspnea: Secondary | ICD-10-CM

## 2016-02-11 DIAGNOSIS — R059 Cough, unspecified: Secondary | ICD-10-CM

## 2016-02-11 DIAGNOSIS — J449 Chronic obstructive pulmonary disease, unspecified: Secondary | ICD-10-CM | POA: Diagnosis not present

## 2016-02-11 DIAGNOSIS — R05 Cough: Secondary | ICD-10-CM

## 2016-02-11 DIAGNOSIS — R0600 Dyspnea, unspecified: Secondary | ICD-10-CM

## 2016-02-11 HISTORY — DX: Dyspnea, unspecified: R06.00

## 2016-02-11 HISTORY — DX: Other forms of dyspnea: R06.09

## 2016-02-11 MED ORDER — ALBUTEROL SULFATE HFA 108 (90 BASE) MCG/ACT IN AERS: 2.0000 | INHALATION_SPRAY | Freq: Four times a day (QID) | RESPIRATORY_TRACT | Status: DC | PRN

## 2016-02-11 MED ORDER — BUDESONIDE-FORMOTEROL FUMARATE 80-4.5 MCG/ACT IN AERO: 2.0000 | INHALATION_SPRAY | Freq: Two times a day (BID) | RESPIRATORY_TRACT | Status: DC

## 2016-02-11 MED ORDER — BUDESONIDE-FORMOTEROL FUMARATE 80-4.5 MCG/ACT IN AERO: 1.0000 | INHALATION_SPRAY | Freq: Two times a day (BID) | RESPIRATORY_TRACT | Status: DC

## 2016-02-11 NOTE — Patient Instructions (Signed)
Follow up with Dr. Stevenson Clinch in 2 months - pulmonary function testing and 6 minute walk test prior to follow up - New Maintenance med - symbicort 80/4.5 - 1 puff BID, -gargle and rinse after each use.  - cont with spiriva - Maintenance med - cont with Duonebs as needed - this is a rescue med.  - New Rescue Med- albuterol inhaler - 2puff every 3-4 hours as needed for shortness of breath\wheezing\recurrent cough - CXR for dyspnea, prior to follow up visit.  - cont with speech therapy follow up - cont to avoid noxious substances

## 2016-02-11 NOTE — Progress Notes (Signed)
Patient ID: Patrick Gregory, male   DOB: 02/26/1961, 55 y.o.   MRN: [id number] Patient seen in the office today and instructed on use of symbicort .  Patient expressed understanding and demonstrated technique.

## 2016-02-15 NOTE — Progress Notes (Signed)
[facility] Consultation    Date: 02/15/2016  MRN# [id number] Patrick Gregory Nov 06, 1960  Referring Physician: Dr.McQueen PMD - Dr. Abram Sander Patrick Gregory is a 55 y.o. old male seen in consultation for COPD management and optimization  CC:  Chief Complaint  Patient presents with  . pulmonary consult    pt ref by Dr. Tami Ribas for hx of COPD/asthma. dx w/COPD around 2012. c/o dry cough occ prod w/green mucus, occ wheezing & occ chest tightness X10y    Brief HPI: 55 year old male with COPD, recently moved from [place], following up with pulmonary for COPD optimization. History of right vocal cord polyp or ulceration, nonmalignant, status post resection with chronic hoarse voice and sputum production.  HPI:  Patient is a pleasant 55 year old male past medical history of COPD, chronic back pain, cervical spinal stenosis, acid reflux seen in consultation for chronic cough, COPD optimization. Patient moved to [facility] in December 2016, previous a followed at the [facility]. Today's accompanied by his fiance. He is a former smoker, 35 years 1.5 packs per day, quit about 3 years ago. Diagnosis at the [place] with COPD about 10 years ago, stage I. He is currently awaiting [facility] in [place]. His current medications include Spiriva once a day and DuoNeb's as needed, maybe using at least once a week. States his COPD is worse in the summer, and with noxious substances, symptoms include increased shortness of breath and cough. He was recently diagnosed with a right focal cord ulcerating nonmalignant lesion. He had it resected, and since then has had productive cough. His last PFTs were about 2 years ago. 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 Again positive hoarse voice since removal of vocal cord lesion. Also endorses dyspnea on exertion, can walk about 70 units before starting feeling short of breath. Again productive cough, greenish sputum more  over the last 3 months after surgical removal right focal cord ulcerating lesion.  PMHX:   Past Medical History  Diagnosis Date  . Asthma   . Chickenpox   . Depression   . Allergic rhinitis   . Sleep apnea     supposed to use CPAP. Does not have.  Marland Kitchen COPD (chronic obstructive pulmonary disease) ([place])   . Arthritis     shoulders, back  . Throat cancer ([place])   . Seizures ([place])     last one approx 18 mos ago.  No meds approx 1 yr.  . Low back pain   . Stiff neck     limited side to side turn.  Left side pain when looking up.  . Bipolar disorder [facility])    Surgical Hx:  Past Surgical History  Procedure Laterality Date  . Throat surgery    . Direct laryngoscopy Right 10/23/2015    Procedure: MICO DIRECT LARYNGOSCOPY WITH EXCISION RIGHT VOCAL CORD LESION;  Surgeon: Beverly Gust, MD;  Location: [place] [facility];  Service: ENT;  Laterality: Right;   Family Hx:  Family History  Problem Relation Age of Onset  . Alcoholism    . Arthritis    . Hyperlipidemia    . Heart disease    . Sudden death    . Kidney disease    . Mental illness     Social Hx:   Social History  Substance Use Topics  . Smoking status: Former Smoker -- 1.00 packs/day for 35 years    Types: Cigarettes    Quit date: 05/01/2014  . Smokeless tobacco: None  . Alcohol Use:  0.0 oz/week    0 Standard drinks or equivalent per week     Comment: occ   Medication:   Current Outpatient Rx  Name  Route  Sig  Dispense  Refill  . cyclobenzaprine (FLEXERIL) 10 MG tablet   Oral   Take 10 mg by mouth at bedtime.         Marland Kitchen HYDROcodone-acetaminophen (NORCO/VICODIN) 5-325 MG tablet   Oral   Take 1 tablet by mouth every 6 (six) hours as needed for moderate pain.   30 tablet   0   . ipratropium-albuterol (DUONEB) 0.5-2.5 (3) MG/3ML SOLN   Nebulization   Take 3 mLs by nebulization 2 (two) times daily.         . Lurasidone HCl 60 MG TABS   Oral   Take 60 mg by mouth at bedtime.         Marland Kitchen omeprazole  (PRILOSEC) 20 MG capsule   Oral   Take 20 mg by mouth 2 (two) times daily before a meal.         . pantoprazole (PROTONIX) 40 MG tablet   Oral   Take 1 tablet (40 mg total) by mouth 2 (two) times daily before a meal.   60 tablet   3   . ranitidine (ZANTAC) 300 MG tablet   Oral   Take 300 mg by mouth at bedtime.         Marland Kitchen tiotropium (SPIRIVA) 18 MCG inhalation capsule   Inhalation   Place 18 mcg into inhaler and inhale daily.         Marland Kitchen venlafaxine XR (EFFEXOR-XR) 150 MG 24 hr capsule   Oral   Take 300 mg by mouth daily with breakfast.         . albuterol (PROVENTIL HFA;VENTOLIN HFA) 108 (90 Base) MCG/ACT inhaler   Inhalation   Inhale 2 puffs into the lungs every 6 (six) hours as needed for wheezing or shortness of breath.   1 Inhaler   2   . EXPIRED: budesonide-formoterol (SYMBICORT) 80-4.5 MCG/ACT inhaler   Inhalation   Inhale 2 puffs into the lungs 2 (two) times daily.   1 Inhaler   0   . budesonide-formoterol (SYMBICORT) 80-4.5 MCG/ACT inhaler   Inhalation   Inhale 1 puff into the lungs 2 (two) times daily.   1 Inhaler   2       Allergies:  Review of patient's allergies indicates no known allergies.  Review of Systems  Constitutional: Negative for fever and chills.  HENT: Positive for sore throat.   Eyes: Negative for blurred vision.  Respiratory: Positive for cough, sputum production and shortness of breath.   Cardiovascular: Negative for chest pain.  Gastrointestinal: Negative for heartburn and nausea.  Genitourinary: Negative for dysuria.  Musculoskeletal: Positive for back pain.  Skin: Negative for rash.  Neurological: Negative for dizziness.  Endo/Heme/Allergies: Does not bruise/bleed easily.  Psychiatric/Behavioral: Negative for depression.     Physical Examination:   VS: BP 122/64 mmHg  Pulse 82  Ht 5\' 9"  (1.753 m)  Wt 199 lb (90.266 kg)  BMI 29.37 kg/m2  SpO2 98%  General Appearance: No distress  Neuro:without focal findings,  mental status, speech normal, alert and oriented, cranial nerves 2-12 intact, reflexes normal and symmetric, sensation grossly normal  HEENT: PERRLA, EOM intact, no ptosis, no other lesions noticed; Mallampati 2; hoarse voice Pulmonary: Coarse upper airway sounds with transmission to the bilateral lung lobes, good airway entry, diaphragmatic excursion normal.No wheezing, No rales;  Sputum Production:  None in office CardiovascularNormal S1,S2.  No m/r/g.  Abdominal aorta pulsation normal.    Abdomen: Benign, Soft, non-tender, No masses, hepatosplenomegaly, No lymphadenopathy Renal:  No costovertebral tenderness  GU:  No performed at this time. Endoc: No evident thyromegaly, no signs of acromegaly or Cushing features Skin:   warm, no rashes, no ecchymosis  Extremities: normal, no cyanosis, clubbing, no edema, warm with normal capillary refill. Other findings:none    Assessment and Plan: 55 year old male past medical history of stage I COPD, recent removal of right focal cord no malignant lesion, now with chronic cough, seen in consultation for chronic cough and COPD optimization along with transition of care. COPD (chronic obstructive pulmonary disease) ([place]) Patient with known COPD, stage I. Last PFTs were done 2 years ago. He has quit smoking 3 years ago. At this time will continue with COPD management optimize with addition of a inhale corticosteroid and long-acting beta agonist such as Symbicort. I suspect that he may be having bronchial airway irritation given his recent resection of a vocal cord malignant lesion. I have reviewed the patient's current medications, explained the difference between rescue and maintenance med. Today we discussed again the initiation of optimizing his COPD treatment with a combination drug such as Symbicort. Patient educated on the use, administration, and side effects of his current and new inhalers.   Plan: - pulmonary function testing and 6 minute walk  test prior to follow up - New Maintenance med - symbicort 80/4.5 - 1 puff BID, -gargle and rinse after each use.  - cont with spiriva - Maintenance med - cont with Duonebs as needed - this is a rescue med.  - New Rescue Med- albuterol inhaler - 2puff every 3-4 hours as needed for shortness of breath\wheezing\recurrent cough - CXR for dyspnea, prior to follow up visit.  - cont with speech therapy follow up - cont to avoid noxious substances  DOE (dyspnea on exertion) Dyspnea on exertion is mild. Most likely due to COPD along with deconditioning, and chronic back pains.  Plan: -Continue with exercise -Continue tobacco avoidance -Optimization of COPD -Chest x-ray prior to follow-up visit -Primary function testing prior to follow-up visit  Cough Multifactorial: Vocal cord lesion, status post removal, COPD, prior tobacco abuse.  I suspect the majority of his chronic cough at this time over the last 3 months is due to vocal cord irritation causing bronchospasms and bronchial airway irritation. This could be triggering his COPD which is very mild symptomatically speaking.  COPD optimization with addition of Symbicort.  Plan: -Continue with speech therapy for vocal cord issues -Maintenance and rescue meds given to patient. New addition of Symbicort.    Updated Medication List Outpatient Encounter Prescriptions as of 02/11/2016  Medication Sig  . cyclobenzaprine (FLEXERIL) 10 MG tablet Take 10 mg by mouth at bedtime.  Marland Kitchen HYDROcodone-acetaminophen (NORCO/VICODIN) 5-325 MG tablet Take 1 tablet by mouth every 6 (six) hours as needed for moderate pain.  Marland Kitchen ipratropium-albuterol (DUONEB) 0.5-2.5 (3) MG/3ML SOLN Take 3 mLs by nebulization 2 (two) times daily.  . Lurasidone HCl 60 MG TABS Take 60 mg by mouth at bedtime.  Marland Kitchen omeprazole (PRILOSEC) 20 MG capsule Take 20 mg by mouth 2 (two) times daily before a meal.  . pantoprazole (PROTONIX) 40 MG tablet Take 1 tablet (40 mg total) by mouth 2  (two) times daily before a meal.  . ranitidine (ZANTAC) 300 MG tablet Take 300 mg by mouth at bedtime.  Marland Kitchen tiotropium (SPIRIVA) 18 MCG  inhalation capsule Place 18 mcg into inhaler and inhale daily.  Marland Kitchen venlafaxine XR (EFFEXOR-XR) 150 MG 24 hr capsule Take 300 mg by mouth daily with breakfast.  . albuterol (PROVENTIL HFA;VENTOLIN HFA) 108 (90 Base) MCG/ACT inhaler Inhale 2 puffs into the lungs every 6 (six) hours as needed for wheezing or shortness of breath.  . budesonide-formoterol (SYMBICORT) 80-4.5 MCG/ACT inhaler Inhale 2 puffs into the lungs 2 (two) times daily.  . budesonide-formoterol (SYMBICORT) 80-4.5 MCG/ACT inhaler Inhale 1 puff into the lungs 2 (two) times daily.  . [DISCONTINUED] atorvastatin (LIPITOR) 40 MG tablet Take 20 mg by mouth daily. Reported on 02/11/2016  . [DISCONTINUED] folic acid (FOLVITE) 1 MG tablet Take 1 mg by mouth daily. Reported on 02/11/2016  . [DISCONTINUED] pregabalin (LYRICA) 75 MG capsule Take 75 mg by mouth 2 (two) times daily. Reported on 02/11/2016   No facility-administered encounter medications on file as of 02/11/2016.    Orders for this visit: Orders Placed This Encounter  Procedures  . DG Chest 2 View    Standing Status: Future     Number of Occurrences:      Standing Expiration Date: 04/12/2017    Order Specific Question:  Reason for Exam (SYMPTOM  OR DIAGNOSIS REQUIRED)    Answer:  sob    Order Specific Question:  Preferred imaging location?    Answer:  [facility]  . Pulmonary function test    Standing Status: Future     Number of Occurrences:      Standing Expiration Date: 02/10/2017    Order Specific Question:  Where should this test be performed?    Answer:  [facility]    Order Specific Question:  Full PFT: includes the following: basic spirometry, spirometry pre & post bronchodilator, diffusion capacity (DLCO), lung volumes    Answer:  Full PFT  . 6 minute walk    Standing Status: Future     Number of Occurrences:       Standing Expiration Date: 02/10/2017    Order Specific Question:  Where should this test be performed?    Answer:  Other     Thank  you for the consultation and for allowing [facility] to assist in the care of your patient. Our recommendations are noted above.  Please contact us if we can be of further service.   Vilinda Boehringer, MD [facility] Office Number: [phone]  Note: This note was prepared with Dragon dictation along with smaller phrase technology. Any transcriptional errors that result from this process are unintentional.

## 2016-02-15 NOTE — Assessment & Plan Note (Addendum)
Patient with known COPD, stage I. Last PFTs were done 2 years ago. He has quit smoking 3 years ago. At this time will continue with COPD management optimize with addition of a inhale corticosteroid and long-acting beta agonist such as Symbicort. I suspect that he may be having bronchial airway irritation given his recent resection of a vocal cord malignant lesion. I have reviewed the patient's current medications, explained the difference between rescue and maintenance med. Today we discussed again the initiation of optimizing his COPD treatment with a combination drug such as Symbicort. Patient educated on the use, administration, and side effects of his current and new inhalers. Patient will also be following pulmonary at the [facility].   Plan: - pulmonary function testing and 6 minute walk test prior to follow up - New Maintenance med - symbicort 80/4.5 - 1 puff BID, -gargle and rinse after each use.  - cont with spiriva - Maintenance med - cont with Duonebs as needed - this is a rescue med.  - New Rescue Med- albuterol inhaler - 2puff every 3-4 hours as needed for shortness of breath\wheezing\recurrent cough - CXR for dyspnea, prior to follow up visit.  - cont with speech therapy follow up - cont to avoid noxious substances

## 2016-02-15 NOTE — Assessment & Plan Note (Signed)
Dyspnea on exertion is mild. Most likely due to COPD along with deconditioning, and chronic back pains.  Plan: -Continue with exercise -Continue tobacco avoidance -Optimization of COPD -Chest x-ray prior to follow-up visit -Primary function testing prior to follow-up visit

## 2016-02-15 NOTE — Assessment & Plan Note (Signed)
Multifactorial: Vocal cord lesion, status post removal, COPD, prior tobacco abuse.  I suspect the majority of his chronic cough at this time over the last 3 months is due to vocal cord irritation causing bronchospasms and bronchial airway irritation. This could be triggering his COPD which is very mild symptomatically speaking.  COPD optimization with addition of Symbicort.  Plan: -Continue with speech therapy for vocal cord issues -Maintenance and rescue meds given to patient. New addition of Symbicort.

## 2016-02-18 ENCOUNTER — Ambulatory Visit: Payer: Medicare Other | Attending: Unknown Physician Specialty | Admitting: Speech Pathology

## 2016-02-18 DIAGNOSIS — R490 Dysphonia: Secondary | ICD-10-CM | POA: Diagnosis not present

## 2016-02-19 ENCOUNTER — Encounter: Payer: Self-pay | Admitting: Speech Pathology

## 2016-02-19 NOTE — Therapy (Signed)
[place] MAIN [facility] SERVICES [address] [place], [place], [address] Phone: [phone]   Fax:  [phone]  Speech Language Pathology Evaluation  Patient Details  Name: Patrick Gregory MRN: [id number] Date of Birth: [birth year] Referring Provider: Dr. Beverly Gust  Encounter Date: 02/18/2016      End of Session - 02/19/16 0931    Visit Number 1   Number of Visits 17   Date for SLP Re-Evaluation 04/22/16   SLP Start Time 1600   SLP Stop Time  1650   SLP Time Calculation (min) 50 min   Activity Tolerance Patient tolerated treatment well      Past Medical History  Diagnosis Date   Asthma    Chickenpox    Depression    Allergic rhinitis    Sleep apnea     supposed to use CPAP. Does not have.   COPD (chronic obstructive pulmonary disease) ([facility])    Arthritis     shoulders, back   Throat cancer ([facility])    Seizures ([place])     last one approx 18 mos ago.  No meds approx 1 yr.   Low back pain    Stiff neck     limited side to side turn.  Left side pain when looking up.   Bipolar disorder [facility])     Past Surgical History  Procedure Laterality Date   Throat surgery     Direct laryngoscopy Right 10/23/2015    Procedure: MICO DIRECT LARYNGOSCOPY WITH EXCISION RIGHT VOCAL CORD LESION;  Surgeon: Beverly Gust, MD;  Location: [place];  Service: ENT;  Laterality: Right;    There were no vitals filed for this visit.      Subjective Assessment - 02/19/16 0930    Subjective [name], under the care of Dr. Tami Ribas, was referred to speech therapy for dysphonia and hoarseness without pain. Pt reports 6 months of hoarseness prior to having right vocal cord mass removed [id number] without complication. Flexible laryngoscopy performed on 02/09/16 revealed scarring on the vocal folds and that pt is using false vocal folds for phonation.   Currently in Pain? No/denies            SLP Evaluation [facility] - 02/19/16 0001     SLP Visit Information   SLP Received On 02/18/16   Referring Provider Dr. Beverly Gust   Onset Date 04/02/15   Subjective   Subjective [name], under the care of Dr. Tami Ribas, was referred to speech therapy for dysphonia and hoarseness without pain. Pt reports 6 months of hoarseness prior to having right vocal cord mass removed [id number] without complication. Flexible laryngoscopy performed on 02/09/16 revealed scarring on the vocal folds and that pt is using false vocal folds for phonation.   Patient/Family Stated Goal 'Try to find my voice again"   Oral Motor/Sensory Function   Overall Oral Motor/Sensory Function Appears within functional limits for tasks assessed   Motor Speech   Overall Motor Speech Appears within functional limits for tasks assessed        Perceptual Voice Evaluation Voice checklist:  Health risks: Former smoker; daily caffeine consumption (4-5 c.); constant cough and throat clearing  Characteristic voice use: Conversation  Misuse: inadequate breath support  Abuse: Frequent shouting  Vocal characteristics: Hoarseness; limited pitch range; strain; vocal fatigue Patient Quality of Life Survey: Voice Handicap Index-10 Score of 28  A score of 10 or higher indicates perceived handicap Maximum phonation time for sustained ah: 4  sec. (consistently interrupted by cough) Mean intensity during sustained ah: 80 dB Mean intensity sustained during conversational speech: 65 dB Average fundamental frequency during sustained ah: 227 Hz (2 STD above average for age and gender) Average time patient was able to sustain /s/: 13 sec. Average time patient was able to sustain /z/: 12 sec. s/z ratio : 1.1/1 Pitch: pitch range very limited Visi-Pitch: Multi-Dimensional Voice Program (MDVP) MDVP extracts objective quantitative values (Relative Average Perturbation, Shimmer, Voice Turbulence Index, and Noise to Harmonic Ratio) on sustained phonation, which are  displayed graphically and numerically in comparison to a built-in normative database.  The patient exhibited values outside the norm for Relative Average Perturbation, Shimmer, and Voice Turbulence Index.  Average fundamental frequency was 2 STD above the average for age and gender. Stimulability: Patient was not stimulable for improved voice due to muscle tension. He was receptive to breathing exercises and successfully modeled deep breathing and sustained airflow.                  SLP Education - 02/19/16 0931    Education provided Yes   Education Details Role of SLP in voice Tx   Person(s) Educated Patient   Methods Explanation   Comprehension Verbalized understanding            SLP Long Term Goals - 02/19/16 0933    SLP LONG TERM GOAL #1   Title The patient will demonstrate independent understanding of vocal hygiene concepts and neck, shoulder, lingual stretching exercises.   Time 8   Period Weeks   Status New   SLP LONG TERM GOAL #2   Title The patient will be independent for abdominal breathing and breath support exercises.   Time 8   Period Weeks   Status New   SLP LONG TERM GOAL #3   Title The patient will minimize vocal tension via Yawn-Sigh approach (or comparable technique) with min SLP cues with 80% accuracy.   Time 8   Period Weeks   Status New   SLP LONG TERM GOAL #4   Title The patient will maximize voice quality and loudness using breath support for paragraph length recitation with 80% accuracy.   Time 8   Period Weeks   Status New          Plan - 02/19/16 0932    Clinical Impression Statement This 55 year old man under the care of Dr. Tami Ribas is presenting with moderate-severe dysphonia without pain.  The patient demonstrates hoarse vocal quality, reduced breath control for speech, strained/tense phonation, limited pitch range, vocal fatigue, severe laryngeal tension, and frequent coughing with phonation. He will benefit from voice therapy for  education, to improve breath support, improve tone focus, promote easy flow phonation, and learn techniques to increase quality and pitch range without strain or pain.   Speech Therapy Frequency 2x / week   Duration Other (comment)  8 weeks   Treatment/Interventions Other (comment)  Voice   Potential to Achieve Goals Good   Potential Considerations Ability to learn/carryover information;Family/community support;Co-morbidities;Cooperation/participation level;Medical prognosis;Pain level;Previous level of function;Severity of impairments;Financial resources   SLP Home Exercise Plan Breath support exercises   Consulted and Agree with Plan of Care Patient      Patient will benefit from skilled therapeutic intervention in order to improve the following deficits and impairments:   Dysphonia    Problem List Patient Active Problem List   Diagnosis Date Noted   COPD (chronic obstructive pulmonary disease) ([place]) 02/11/2016   DOE (dyspnea  on exertion) 02/11/2016   Cough 02/11/2016   Epigastric discomfort 01/26/2016   Hoarseness 10/08/2015   GERD (gastroesophageal reflux disease) 10/08/2015   Neck pain 10/08/2015   Anxiety 10/08/2015   Muscle cramps 10/08/2015    Leroy Kennedy 02/19/2016, 9:36 AM  [place] MAIN [facility] SERVICES [place], [place], [address] Phone: [phone]   Fax:  [phone]  Name: Patrick Gregory MRN: [id number] Date of Birth: July 24, 1961

## 2016-02-22 ENCOUNTER — Ambulatory Visit: Payer: Medicare Other | Admitting: Speech Pathology

## 2016-02-22 DIAGNOSIS — R490 Dysphonia: Secondary | ICD-10-CM

## 2016-02-22 NOTE — Therapy (Signed)
[place] MAIN [facility] SERVICES [address] [place], [place], [address] Phone: [phone]   Fax:  [phone]  Speech Language Pathology Treatment  Patient Details  Name: Patrick Gregory MRN: [id number] Date of Birth: Sep 04, 1960 Referring Provider: Dr. Beverly Gust  Encounter Date: 02/22/2016      End of Session - 02/22/16 1501    Visit Number 2   Number of Visits 17   Date for SLP Re-Evaluation 04/22/16   SLP Start Time 1404   SLP Stop Time  1450   SLP Time Calculation (min) 46 min   Activity Tolerance Patient tolerated treatment well      Past Medical History:  Diagnosis Date  . Allergic rhinitis   . Arthritis    shoulders, back  . Asthma   . Bipolar disorder ([place])   . Chickenpox   . COPD (chronic obstructive pulmonary disease) ([place])   . Depression   . Low back pain   . Seizures ([place])    last one approx 18 mos ago.  No meds approx 1 yr.  . Sleep apnea    supposed to use CPAP. Does not have.  . Stiff neck    limited side to side turn.  Left side pain when looking up.  . Throat cancer [facility])     Past Surgical History:  Procedure Laterality Date  . DIRECT LARYNGOSCOPY Right 10/23/2015   Procedure: MICO DIRECT LARYNGOSCOPY WITH EXCISION RIGHT VOCAL CORD LESION;  Surgeon: Beverly Gust, MD;  Location: [place];  Service: ENT;  Laterality: Right;  . THROAT SURGERY      There were no vitals filed for this visit.      Subjective Assessment - 02/22/16 1458    Subjective Pt reports he has been working on exercises provided during evaluation. Reports that he was able to practice abdominal breathing without difficulty.    Currently in Pain? No/denies               ADULT SLP TREATMENT - 02/22/16 0001      General Information   Behavior/Cognition Alert;Cooperative     Treatment Provided   Treatment provided Cognitive-Linquistic     Pain Assessment   Pain Assessment No/denies pain     Cognitive-Linquistic Treatment   Treatment focused on Voice   Skilled Treatment Patrick Gregory completed breath support exercises including abdominal breathing and sustained /s/ w/ 80% acc w/min cues. Pt able to demonstrate good abdominal breath. Pt completed exercises for muscle tension including tongue stretch and yawn w/70% acc w/mod verbal cues. Pt requires cues to decrease tension in shoulders and neck.  Patrick Gregory completed vocal tract relaxation exercises including chewing, lip trills, straw phonation, and flow phonation w/50% acc w/mod cues. Pt was unable to produce good vocal quality, however noted decrease in muscle tension during breathing exercises.      Assessment / Recommendations / Plan   Plan Continue with current plan of care     Progression Toward Goals   Progression toward goals Progressing toward goals          SLP Education - 02/22/16 1459    Education provided Yes   Education Details relaxation exercises; abdominal breathing; relaxed phonation   Person(s) Educated Patient   Methods Explanation   Comprehension Verbalized understanding            SLP Long Term Goals - 02/19/16 [id number]      SLP LONG TERM GOAL #1   Title The patient will demonstrate independent  understanding of vocal hygiene concepts and neck, shoulder, lingual stretching exercises.   Time 8   Period Weeks   Status New     SLP LONG TERM GOAL #2   Title The patient will be independent for abdominal breathing and breath support exercises.   Time 8   Period Weeks   Status New     SLP LONG TERM GOAL #3   Title The patient will minimize vocal tension via Yawn-Sigh approach (or comparable technique) with min SLP cues with 80% accuracy.   Time 8   Period Weeks   Status New     SLP LONG TERM GOAL #4   Title The patient will maximize voice quality and loudness using breath support for paragraph length recitation with 80% accuracy.   Time 8   Period Weeks   Status New          Plan - 02/22/16  1603    Clinical Impression Statement Pt continues to demonstrate moderate-severe dysphonia c/b hoarse vocal quality, reduced breath control and strained phonation, and severe laryngeal tension.  Pt was demonstrating improving breath control today but with continued strained vocal quality. Pt will benefit from voice therapy to improve breath support, improve tone focus, promote easy flow phonation and education.    Speech Therapy Frequency 2x / week   Duration --  8 weeks   Treatment/Interventions --  voice   Potential to Achieve Goals Good   Potential Considerations Ability to learn/carryover information;Family/community support;Co-morbidities;Cooperation/participation level;Medical prognosis;Pain level;Previous level of function;Severity of impairments;Financial resources   SLP Home Exercise Plan Breath support exercises, relaxation exercises   Consulted and Agree with Plan of Care Patient      Patient will benefit from skilled therapeutic intervention in order to improve the following deficits and impairments:   Dysphonia    Problem List Patient Active Problem List   Diagnosis Date Noted  . COPD (chronic obstructive pulmonary disease) ([place]) 02/11/2016  . DOE (dyspnea on exertion) 02/11/2016  . Cough 02/11/2016  . Epigastric discomfort 01/26/2016  . Hoarseness 10/08/2015  . GERD (gastroesophageal reflux disease) 10/08/2015  . Neck pain 10/08/2015  . Anxiety 10/08/2015  . Muscle cramps 10/08/2015    Patrick Gregory,Patrick Gregory 02/22/2016, 4:07 PM  [place] MAIN [facility] SERVICES [address] [place], [place], [address] Phone: [phone]   Fax:  [phone]   Name: Patrick Gregory MRN: [id number] Date of Birth: 1961-04-23

## 2016-02-23 ENCOUNTER — Other Ambulatory Visit: Payer: Self-pay

## 2016-02-23 ENCOUNTER — Other Ambulatory Visit: Payer: Self-pay | Admitting: *Deleted

## 2016-02-23 NOTE — Telephone Encounter (Signed)
Last refill was 7/12 #30, take Every 6 hours as needed. Please advise refill, appt scheduled for tomorrow

## 2016-02-23 NOTE — Telephone Encounter (Signed)
Patient has requested to have his hydrocodone refilled  Pt has an appt on tomorrow.

## 2016-02-23 NOTE — Telephone Encounter (Signed)
Pt is requesting refill on Norco. States he needs medication filled today. Takes 3 tabs daily, and ran out of medication early. Please advise. Renaldo Fiddler, CMA

## 2016-02-24 ENCOUNTER — Ambulatory Visit (INDEPENDENT_AMBULATORY_CARE_PROVIDER_SITE_OTHER): Payer: Medicare Other | Admitting: Family Medicine

## 2016-02-24 ENCOUNTER — Ambulatory Visit (HOSPITAL_COMMUNITY)
Admission: RE | Admit: 2016-02-24 | Discharge: 2016-02-24 | Disposition: A | Discharge status: Home / Self Care | Payer: Medicare Other | Source: Ambulatory Visit | Attending: Family Medicine | Admitting: Family Medicine

## 2016-02-24 VITALS — BP 128/74 | HR 94 | Temp 98.4°F | Wt 193.6 lb

## 2016-02-24 DIAGNOSIS — R208 Other disturbances of skin sensation: Secondary | ICD-10-CM

## 2016-02-24 DIAGNOSIS — R200 Anesthesia of skin: Secondary | ICD-10-CM

## 2016-02-24 DIAGNOSIS — M545 Low back pain, unspecified: Secondary | ICD-10-CM

## 2016-02-24 DIAGNOSIS — S39012A Strain of muscle, fascia and tendon of lower back, initial encounter: Secondary | ICD-10-CM | POA: Diagnosis not present

## 2016-02-24 DIAGNOSIS — R569 Unspecified convulsions: Secondary | ICD-10-CM | POA: Insufficient documentation

## 2016-02-24 DIAGNOSIS — G8929 Other chronic pain: Secondary | ICD-10-CM

## 2016-02-24 DIAGNOSIS — M4722 Other spondylosis with radiculopathy, cervical region: Secondary | ICD-10-CM | POA: Diagnosis not present

## 2016-02-24 DIAGNOSIS — M5126 Other intervertebral disc displacement, lumbar region: Secondary | ICD-10-CM | POA: Diagnosis not present

## 2016-02-24 HISTORY — DX: Other chronic pain: G89.29

## 2016-02-24 HISTORY — DX: Low back pain, unspecified: M54.50

## 2016-02-24 MED ORDER — HYDROCODONE-ACETAMINOPHEN 5-325 MG PO TABS: 1.0000 | ORAL_TABLET | Freq: Four times a day (QID) | ORAL | 0 refills | Status: DC | PRN

## 2016-02-24 MED ORDER — GADOBENATE DIMEGLUMINE 529 MG/ML IV SOLN
20.0000 mL | Freq: Once | INTRAVENOUS | Status: AC | PRN
  Administered 2016-02-24: 18 mL via INTRAVENOUS

## 2016-02-24 MED ORDER — LEVETIRACETAM 500 MG PO TABS: 500.0000 mg | ORAL_TABLET | Freq: Two times a day (BID) | ORAL | 1 refills | Status: DC

## 2016-02-24 NOTE — Assessment & Plan Note (Signed)
Patient with chronic low back pain recently worsened and recently possibly associated with urinary incontinence. Patient is neurologically intact today though the worsening of his back pain and the possible urinary incontinence is concerning for pathology in his low back. We will obtain an MRI of his lumbar spine today to rule out compressive pathology. He will continue Vicodin for his chronic pain. Advised that once we sort out his issues from today we will likely need him to see pain management for management of his narcotics. He was in agreement with this.

## 2016-02-24 NOTE — Progress Notes (Signed)
[name], MD Phone: [phone]  Patrick Gregory is a 55 y.o. male who presents today for f/u.  Seizures: patient notes he has a history of seizures. His first seizure was 3-1/2 years ago. He was placed on medication of which he does not remember the name. He took this medication for a year and stopped it on his own because he had not had any seizures in a year. He had gone about 2 years without a seizure until about 6 months ago when he had 3 in a relatively short time frame. And then he went until about 2 weeks ago with no seizures. At that time he had 3 seizures in 4 days. The first seizure he was just sitting there and he went blank and his eyes went to the back of his head and lasted for about 30 seconds. When he came to he was confused for about 30 minutes. The next one happened the day after where he was standing and putting something on the wall and he froze up and was unresponsive and then came to about 30 seconds later and was confused for another 30 minutes. Each time he can't remember anything that happened while he was out of it. 2 days after that though he went to the ground and his arms jerked. He was confused for about 30 minutes. He had some nausea and headache following these events. He had no preceding symptoms. No chest pain, shortness breath, or palpitations. No incontinence during the episodes. Notes he has not been driving since these events occurred. Notes he is referred to a neurologist today by his neurosurgeon.  Patient additionally notes seeing the neurosurgeon for his neck discomfort. He notes he is going to get some injections to help with this. He also notes low back discomfort. He does note some urinary incontinence over the last several days. He will feel the urge to urinate and then quickly will have an incontinence episode. Has occurred 3-4 times in the last several days. Does not occur with every episode of urination. No difficulty with stream with his urination. No  stool incontinence, saddle anesthesia, or fevers. Patient additionally notes that his right face continues to intermittently get numb when he is lying on his left side and resolves when he sits up and changes neck position.  PMH: History of seizures previously treated with medication though not on medication at this time.   ROS see history of present illness  Objective  Physical Exam Vitals:   02/24/16 1337  BP: 128/74  Pulse: 94  Temp: 98.4 F (36.9 C)    BP Readings from Last 3 Encounters:  02/24/16 128/74  02/11/16 122/64  01/25/16 120/74   Wt Readings from Last 3 Encounters:  02/24/16 193 lb 9.6 oz (87.8 kg)  02/11/16 199 lb (90.3 kg)  01/25/16 199 lb 6.4 oz (90.4 kg)    Physical Exam  Constitutional: No distress.  HENT:  Head: Normocephalic and atraumatic.  Mouth/Throat: Oropharynx is clear and moist. No oropharyngeal exudate.  Eyes: Conjunctivae are normal. Pupils are equal, round, and reactive to light.  Neck: Neck supple.  Cardiovascular: Normal rate, regular rhythm and normal heart sounds.   Pulmonary/Chest: Effort normal and breath sounds normal.  Genitourinary: Rectum normal and prostate normal.  Genitourinary Comments: Perineal sensation intact, normal rectal tone  Musculoskeletal:  No midline spine tenderness, no midline spine step-off, no muscular back tenderness  Lymphadenopathy:    He has no cervical adenopathy.  Neurological: He is alert.  CN 2-12  intact, 5/5 strength in bilateral biceps, triceps, grip, quads, hamstrings, plantar and dorsiflexion, sensation to light touch intact in bilateral UE and LE, normal gait, 2+ patellar reflexes  Skin: Skin is warm and dry. He is not diaphoretic.     Assessment/Plan: Please see individual problem list.  Seizures ([place]) Symptoms patient describes seem consistent with seizures. Currently not taking any medication. I spoke with Dr. Delice Lesch at [facility] neurology regarding the patient's history of seizures and  recent symptoms and she recommended starting on Keppra 500 mg twice daily and following up with them. I advised her that I was planning on obtaining an MRI of his brain and she stated it should be with and without contrast. This is ordered and will be obtained tonight given his constellation of symptoms. We will additionally check a CBC and CMP to ensure no gross abnormalities. Referral was placed to neurology. He was advised that he should not be driving at least until he sees neurology and likely not for the next 6 months. He will monitor and is given return precautions.  Low back pain Patient with chronic low back pain recently worsened and recently possibly associated with urinary incontinence. Patient is neurologically intact today though the worsening of his back pain and the possible urinary incontinence is concerning for pathology in his low back. We will obtain an MRI of his lumbar spine today to rule out compressive pathology. He will continue Vicodin for his chronic pain. Advised that once we sort out his issues from today we will likely need him to see pain management for management of his narcotics. He was in agreement with this.   Orders Placed This Encounter  Procedures  . MR Brain Wo Contrast    Standing Status:   Future    Standing Expiration Date:   04/26/2017    Order Specific Question:   Reason for Exam (SYMPTOM  OR DIAGNOSIS REQUIRED)    Answer:   seizures, right sided facial numbness    Order Specific Question:   Preferred imaging location?    Answer:   [facility] (table limit-300lbs)    Order Specific Question:   What is the patient's sedation requirement?    Answer:   No Sedation    Order Specific Question:   Does the patient have a pacemaker or implanted devices?    Answer:   Yes    Order Specific Question:   Manufacturer of pacemake or implanted device?    Answer:   does not have a pacemaker, has screws in his shoulder from rotator cuff surgery    Order Specific  Question:   Call Results- Best Contact Number?    Answer:   before 5 [id number] after 5 [phone]  . MR Lumbar Spine Wo Contrast    Standing Status:   Future    Standing Expiration Date:   04/26/2017    Order Specific Question:   Reason for Exam (SYMPTOM  OR DIAGNOSIS REQUIRED)    Answer:   low back pain with urine incontinence    Order Specific Question:   Preferred imaging location?    Answer:   [facility] (table limit-300lbs)    Order Specific Question:   What is the patient's sedation requirement?    Answer:   No Sedation    Order Specific Question:   Does the patient have a pacemaker or implanted devices?    Answer:   Yes    Order Specific Question:   Manufacturer of pacemake or implanted device?  Answer:   does not have a pacemaker, has screws in his shoulder from rotator cuff surgery    Order Specific Question:   Call Results- Best Contact Number?    Answer:   before 5 [id number] after 5 [phone]  . MR Brain W Wo Contrast    Standing Status:   Future    Standing Expiration Date:   04/26/2017    Order Specific Question:   If indicated for the ordered procedure, I authorize the administration of contrast media per Radiology protocol    Answer:   Yes    Order Specific Question:   Reason for Exam (SYMPTOM  OR DIAGNOSIS REQUIRED)    Answer:   seizures, right sided facial numbness intermittently    Order Specific Question:   Preferred imaging location?    Answer:   [facility] (table limit-350 lbs)    Order Specific Question:   What is the patient's sedation requirement?    Answer:   No Sedation    Order Specific Question:   Does the patient have a pacemaker or implanted devices?    Answer:   Yes    Order Specific Question:   Manufacturer of pacemake or implanted device?    Answer:   does not have a pacemaker, has screws in his shoulder from rotator cuff surgery  . Comprehensive metabolic panel    Standing Status:   Future    Standing Expiration Date:    02/23/2017  . CBC    Standing Status:   Future    Standing Expiration Date:   02/23/2017  . Ambulatory referral to Neurology    Referral Priority:   Urgent    Referral Type:   Consultation    Referral Reason:   Specialty Services Required    Requested Specialty:   Neurology    Number of Visits Requested:   1    Meds ordered this encounter  Medications  . levETIRAcetam (KEPPRA) 500 MG tablet    Sig: Take 1 tablet (500 mg total) by mouth 2 (two) times daily.    Dispense:  60 tablet    Refill:  [place], MD [place]

## 2016-02-24 NOTE — Assessment & Plan Note (Signed)
Symptoms patient describes seem consistent with seizures. Currently not taking any medication. I spoke with Dr. Delice Lesch at [facility] neurology regarding the patient's history of seizures and recent symptoms and she recommended starting on Keppra 500 mg twice daily and following up with them. I advised her that I was planning on obtaining an MRI of his brain and she stated it should be with and without contrast. This is ordered and will be obtained tonight given his constellation of symptoms. We will additionally check a CBC and CMP to ensure no gross abnormalities. Referral was placed to neurology. He was advised that he should not be driving at least until he sees neurology and likely not for the next 6 months. He will monitor and is given return precautions.

## 2016-02-24 NOTE — Progress Notes (Signed)
Pre visit review using our clinic review tool, if applicable. No additional management support is needed unless otherwise documented below in the visit note. 

## 2016-02-24 NOTE — Telephone Encounter (Signed)
Patient has an appointment this afternoon. Will give his refill at that time.

## 2016-02-24 NOTE — Patient Instructions (Signed)
Nice to see you. We are going to get an MRI of your brain and back to evaluate your symptoms further. These will be done tonight. We'll get urine to see neurology. We will start you on Keppra 500 mg twice daily for this. You should not drive for the next 6 months at least until neurology clears you for this. We'll recheck some lab work. If you develop recurrent seizures, numbness, weakness, worsening back pain, fevers, or any new or changing symptoms please seek medical attention.

## 2016-02-25 ENCOUNTER — Encounter (HOSPITAL_COMMUNITY): Payer: Self-pay | Admitting: Emergency Medicine

## 2016-02-25 ENCOUNTER — Encounter: Payer: Self-pay | Admitting: Surgical

## 2016-02-25 ENCOUNTER — Telehealth: Payer: Self-pay | Admitting: Neurology

## 2016-02-25 ENCOUNTER — Other Ambulatory Visit: Payer: Self-pay | Admitting: Neurosurgery

## 2016-02-25 ENCOUNTER — Emergency Department (HOSPITAL_COMMUNITY)
Admission: EM | Admit: 2016-02-25 | Discharge: 2016-02-25 | Disposition: A | Discharge status: Home / Self Care | Payer: Medicare Other | Attending: Emergency Medicine | Admitting: Emergency Medicine

## 2016-02-25 ENCOUNTER — Emergency Department (HOSPITAL_COMMUNITY): Payer: Medicare Other

## 2016-02-25 ENCOUNTER — Ambulatory Visit: Payer: TRICARE For Life (TFL) | Admitting: Speech Pathology

## 2016-02-25 DIAGNOSIS — Z85819 Personal history of malignant neoplasm of unspecified site of lip, oral cavity, and pharynx: Secondary | ICD-10-CM | POA: Insufficient documentation

## 2016-02-25 DIAGNOSIS — R260 Ataxic gait: Secondary | ICD-10-CM | POA: Insufficient documentation

## 2016-02-25 DIAGNOSIS — R270 Ataxia, unspecified: Secondary | ICD-10-CM

## 2016-02-25 DIAGNOSIS — F3112 Bipolar disorder, current episode manic without psychotic features, moderate: Secondary | ICD-10-CM | POA: Diagnosis not present

## 2016-02-25 DIAGNOSIS — R531 Weakness: Secondary | ICD-10-CM | POA: Diagnosis not present

## 2016-02-25 DIAGNOSIS — M4722 Other spondylosis with radiculopathy, cervical region: Secondary | ICD-10-CM

## 2016-02-25 DIAGNOSIS — Z79899 Other long term (current) drug therapy: Secondary | ICD-10-CM | POA: Diagnosis not present

## 2016-02-25 DIAGNOSIS — M545 Low back pain: Secondary | ICD-10-CM | POA: Diagnosis present

## 2016-02-25 DIAGNOSIS — M5489 Other dorsalgia: Secondary | ICD-10-CM | POA: Diagnosis not present

## 2016-02-25 DIAGNOSIS — R404 Transient alteration of awareness: Secondary | ICD-10-CM | POA: Diagnosis not present

## 2016-02-25 DIAGNOSIS — J449 Chronic obstructive pulmonary disease, unspecified: Secondary | ICD-10-CM | POA: Insufficient documentation

## 2016-02-25 DIAGNOSIS — R569 Unspecified convulsions: Secondary | ICD-10-CM | POA: Diagnosis not present

## 2016-02-25 DIAGNOSIS — Z87891 Personal history of nicotine dependence: Secondary | ICD-10-CM | POA: Diagnosis not present

## 2016-02-25 LAB — COMPREHENSIVE METABOLIC PANEL
ALBUMIN: 4 g/dL (ref 3.5–5.0)
ALT: 16 U/L — AB (ref 17–63)
AST: 23 U/L (ref 15–41)
Alkaline Phosphatase: 66 U/L (ref 38–126)
Anion gap: 8 (ref 5–15)
BILIRUBIN TOTAL: 0.6 mg/dL (ref 0.3–1.2)
BUN: 10 mg/dL (ref 6–20)
CHLORIDE: 103 mmol/L (ref 101–111)
CO2: 24 mmol/L (ref 22–32)
Calcium: 8.7 mg/dL — ABNORMAL LOW (ref 8.9–10.3)
Creatinine, Ser: 0.94 mg/dL (ref 0.61–1.24)
GFR calc Af Amer: >60 mL/min (ref >60)
GFR calc non Af Amer: >60 mL/min (ref >60)
GLUCOSE: 91 mg/dL (ref 65–99)
POTASSIUM: 3.8 mmol/L (ref 3.5–5.1)
Sodium: 135 mmol/L (ref 135–145)
TOTAL PROTEIN: 6.7 g/dL (ref 6.5–8.1)

## 2016-02-25 LAB — CBC
HEMATOCRIT: 47.8 % (ref 39.0–52.0)
Hemoglobin: 16.8 g/dL (ref 13.0–17.0)
MCH: 30.8 pg (ref 26.0–34.0)
MCHC: 35.1 g/dL (ref 30.0–36.0)
MCV: 87.7 fL (ref 78.0–100.0)
PLATELETS: 293 10*3/uL (ref 150–400)
RBC: 5.45 MIL/uL (ref 4.22–5.81)
RDW: 12.2 % (ref 11.5–15.5)
WBC: 11.4 10*3/uL — AB (ref 4.0–10.5)

## 2016-02-25 LAB — ETHANOL

## 2016-02-25 NOTE — Consult Note (Signed)
Initial Neurological Consultation                      NEURO HOSPITALIST CONSULT NOTE   Requestig physician: Dr. Venora Maples    Reason for Consult:  Difficulty with gait.   HPI:                                                                                                                                          Patrick Gregory is an 55 y.o. male who presents with a history of difficulty with gait that was noted sometime this morning. He does not have any focal weakness or numbness in the lower extremities he reports no incontinence of bowel or bladder function. A CT of the brain was normal. Yesterday an MRI of the brain was performed which was also unremarkable. An MRI of the lumbar spine revealed some subligamentous disc protrusions at L4-5 bilaterally and small annular tears.  There are no symptoms in the upper extremities. On finger to nose testing exam appears functional Jowel appears to deliberately move his hand off of the area of his nose and then begins to laugh. He does this with both the eyes opened and closed.  [name] is able to ambulate but intermittently bins his knees and indicates that he is experiencing back pain. He is able to stand on his toes and on his heels.   Past Medical History:  Diagnosis Date  . Allergic rhinitis   . Arthritis    shoulders, back  . Asthma   . Bipolar disorder ([place])   . Chickenpox   . COPD (chronic obstructive pulmonary disease) ([place])   . Depression   . Low back pain   . Seizures ([place])    last one approx 18 mos ago.  No meds approx 1 yr.  . Sleep apnea    supposed to use CPAP. Does not have.  . Stiff neck    limited side to side turn.  Left side pain when looking up.  . Throat cancer [facility])     Past Surgical History:  Procedure Laterality Date  . DIRECT LARYNGOSCOPY Right 10/23/2015   Procedure: MICO DIRECT LARYNGOSCOPY WITH EXCISION RIGHT VOCAL CORD LESION;  Surgeon: Beverly Gust, MD;  Location: [facility];  Service: ENT;   Laterality: Right;  . THROAT SURGERY      MEDICATIONS:  I have reviewed the patient's current medications.  No Known Allergies   Social History:  reports that he quit smoking about 21 months ago. His smoking use included Cigarettes. He has a 35.00 pack-year smoking history. He does not have any smokeless tobacco history on file. He reports that he drinks alcohol. He reports that he uses drugs.  Family History  Problem Relation Age of Onset  . Alcoholism    . Arthritis    . Hyperlipidemia    . Heart disease    . Sudden death    . Kidney disease    . Mental illness       ROS:                                                                                                                                       History obtained from chart review  General ROS: negative for - chills, fatigue, fever, night sweats, weight gain or weight loss Psychological ROS: negative for - behavioral disorder, hallucinations, memory difficulties, mood swings or suicidal ideation Ophthalmic ROS: negative for - blurry vision, double vision, eye pain or loss of vision ENT ROS: negative for - epistaxis, nasal discharge, oral lesions, sore throat, tinnitus or vertigo Allergy and Immunology ROS: negative for - hives or itchy/watery eyes Hematological and Lymphatic ROS: negative for - bleeding problems, bruising or swollen lymph nodes Endocrine ROS: negative for - galactorrhea, hair pattern changes, polydipsia/polyuria or temperature intolerance Respiratory ROS: negative for - cough, hemoptysis, shortness of breath or wheezing Cardiovascular ROS: negative for - chest pain, dyspnea on exertion, edema or irregular heartbeat Gastrointestinal ROS: negative for - abdominal pain, diarrhea, hematemesis, nausea/vomiting or stool incontinence Genito-Urinary ROS: negative for - dysuria, hematuria,  incontinence or urinary frequency/urgency Musculoskeletal ROS: negative for - joint swelling or muscular weakness Neurological ROS: as noted in HPI Dermatological ROS: negative for rash and skin lesion changes   General Exam                                                                                                      Blood pressure 137/89, pulse 80, temperature 98.2 F (36.8 C), temperature source Oral, resp. rate 17, SpO2 99 %. HEENT-  Normocephalic, no lesions, without obvious abnormality.  Normal external eye and conjunctiva.  Normal TM's bilaterally.  Normal auditory canals and external ears. Normal external nose, mucus membranes and septum.  Normal pharynx. Cardiovascular- regular rate and rhythm, S1, S2 normal, no murmur, click, rub or gallop, pulses  palpable throughout   Lungs- chest clear, no wheezing, rales, normal symmetric air entry, Heart exam - S1, S2 normal, no murmur, no gallop, rate regular Abdomen- soft, non-tender; bowel sounds normal; no masses,  no organomegaly Extremities- less then 2 second capillary refill Lymph-no adenopathy palpable Musculoskeletal-no joint tenderness, deformity or swelling Skin-warm and dry, no hyperpigmentation, vitiligo, or suspicious lesions  Neurological Examination Mental Status: Alert, oriented, thought content appropriate.  Speech fluent without evidence of aphasia.  Able to follow 3 step commands without difficulty. Cranial Nerves: II: Discs flat bilaterally; Visual fields grossly normal, pupils equal, round, reactive to light and accommodation III,IV, VI: ptosis not present, extra-ocular motions intact bilaterally V,VII: smile symmetric, facial light touch sensation normal bilaterally VIII: hearing normal bilaterally IX,X: uvula rises symmetrically XI: bilateral shoulder shrug XII: midline tongue extension Motor: Right : Upper extremity   5/5    Left:     Upper extremity   5/5  Lower extremity   5/5     Lower extremity    5/5 Tone and bulk:normal tone throughout; no atrophy noted Sensory: Pinprick and light touch intact throughout, bilaterally Deep Tendon Reflexes: 2+ and symmetric throughout Plantars: Right: downgoing   Left: downgoing Cerebellar: Finger to nose testing appears functional in nature heel-to-shin testing is normal. Gait: Gait appears somewhat functional and antalgic.      Lab Results: Basic Metabolic Panel:  Recent Labs Lab 02/25/16 1320  NA 135  K 3.8  CL 103  CO2 24  GLUCOSE 91  BUN 10  CREATININE 0.94  CALCIUM 8.7*    Liver Function Tests:  Recent Labs Lab 02/25/16 1320  AST 23  ALT 16*  ALKPHOS 66  BILITOT 0.6  PROT 6.7  ALBUMIN 4.0   No results for input(s): LIPASE, AMYLASE in the last 168 hours. No results for input(s): AMMONIA in the last 168 hours.  CBC:  Recent Labs Lab 02/25/16 1320  WBC 11.4*  HGB 16.8  HCT 47.8  MCV 87.7  PLT 293    Cardiac Enzymes: No results for input(s): CKTOTAL, CKMB, CKMBINDEX, TROPONINI in the last 168 hours.  Lipid Panel: No results for input(s): CHOL, TRIG, HDL, CHOLHDL, VLDL, LDLCALC in the last 168 hours.  CBG: No results for input(s): GLUCAP in the last 168 hours.  Microbiology: No results found for this or any previous visit.  Coagulation Studies: No results for input(s): LABPROT, INR in the last 72 hours.  Imaging: Ct Head Wo Contrast  Result Date: 02/25/2016 CLINICAL DATA:  Patient has a Hx of seizures last one a week ago. Last night woke up unable to walk, his legs would not move. EXAM: CT HEAD WITHOUT CONTRAST TECHNIQUE: Contiguous axial images were obtained from the base of the skull through the vertex without intravenous contrast. COMPARISON:  Brain MRI, 02/24/2016 FINDINGS: The ventricles are normal in size and configuration. There are no parenchymal masses or mass effect, no evidence of an infarct, no extra-axial masses or abnormal fluid collections and no intracranial hemorrhage. The visualized  sinuses and mastoid air cells are essentially clear. No skull lesion. IMPRESSION: Normal unenhanced CT scan of the brain. Electronically Signed   By: Lajean Manes M.D.   On: 02/25/2016 13:47  Mr Jeri Cos [id number] Contrast  Result Date: 02/24/2016 CLINICAL DATA:  Recent history of seizures. Previous head injury. Intermittent RIGHT facial numbness. EXAM: MRI HEAD WITHOUT AND WITH CONTRAST TECHNIQUE: Multiplanar, multiecho pulse sequences of the brain and surrounding structures were obtained without and with intravenous contrast. CONTRAST:  MultiHance 18 mL. COMPARISON:  MRI lumbar spine reported separately. FINDINGS: The patient was unable to remain motionless for the exam. Small or subtle lesions could be overlooked. No evidence for acute infarction, hemorrhage, mass lesion, hydrocephalus, or extra-axial fluid. Mild subcortical and periventricular T2 and FLAIR hyperintensities, likely chronic microvascular ischemic change. Flow voids are maintained throughout the carotid, basilar, and vertebral arteries. There are no areas of chronic hemorrhage. Partial empty sella.  Mild tonsillar ectopia.  No pineal lesion. Post infusion, no abnormal enhancement of the brain or meninges. Major dural venous sinuses are patent. Thin-section imaging through the temporal lobes demonstrates no hippocampal atrophy or inflammatory process. Visualized calvarium, skull base, and upper cervical osseous structures unremarkable. Scalp and extracranial soft tissues, orbits, sinuses, and mastoids show no acute process. IMPRESSION: Negative exam.  No acute intracranial findings. Electronically Signed   By: Staci Righter M.D.   On: 02/24/2016 20:46  Mr Lumbar Spine Wo Contrast  Result Date: 02/24/2016 CLINICAL DATA:  Low back pain. RIGHT leg pain, numbness and tingling. EXAM: MRI LUMBAR SPINE WITHOUT CONTRAST TECHNIQUE: Multiplanar, multisequence MR imaging of the lumbar spine was performed. No intravenous contrast was administered. COMPARISON:   None. FINDINGS: Segmentation:  Standard. Alignment:  Physiologic. Vertebrae:  No fracture, evidence of discitis, or bone lesion. Conus medullaris: Extends to the L1 level and appears normal. 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 No mass or hydronephrosis. Disc levels: L1-L2:  Normal L2-L3:  Normal L3-L4:  Normal L4-L5: Central annular bulge. Mild facet arthropathy. Annular tears are seen in both foramina, with subligamentous protrusions on the RIGHT and LEFT. In conjunction with facet hypertrophy, there is foraminal narrowing bilaterally which could affect either the RIGHT or LEFT L4 nerve root. L5-S1: Annular bulge. Mild facet arthropathy. No significant subarticular zone or foraminal zone narrowing. IMPRESSION: Potentially symptomatic annular tears with subligamentous protrusions at L4-5 bilaterally. Either L4 nerve root could be irritated. Correlate clinically as a cause of RIGHT leg pain. Electronically Signed   By: Staci Righter M.D.   On: 02/24/2016 19:55   Assessment/Plan:  Jacion is a pleasant 55 year old gentleman who presents with difficulty with gait. His neurologic exam is otherwise normal. His cranial nerve examination is normal his motor and sensory function is also normal. He has no evidence of cerebellar dysfunction in the lower extremities. His finger to nose testing in the upper extremities appears functional in nature. He appears somewhat manic during today's visit.  Upon entering the room Decory was sleeping but when he was awakened he immediately exhibited an extreme startle response. At this time he was able to move all 4 extremities without any evidence of incoordination. When walking he occasionally bends his knees and indicates that he has some back and neck pain. He has already undergone imaging with MRI of the brain as well as the lumbar spine that did not reveal clinically significant changes.  The presentation appears very likely to be  functional in nature at this point. If Telvis does improve he should be able to be discharged to home. If he does not overnight 24-hour observation with physical therapy might be indicated. Consideration to psychiatric evaluation might also be of benefit given his known history of bipolar disorder.  Plan:  1. If Shaunak's gait improves he should be able to be discharged home  2. If Jacobie continues to exhibit difficulty with gait physical therapy and psychiatric consultation may be of benefit  Thank you for consulting the neurology service to assist in  the care of your patient!    Hrithik Boschee A. Tasia Catchings, M.D. Neurohospitalist Phone: [phone]  02/25/2016, 3:04 PM

## 2016-02-25 NOTE — Telephone Encounter (Signed)
Not sure if this is urgent.   There is a "follow up" spot available at the end of your morning on 03/03/16 at 11:30 am (Thursday).   Please advise if he needs moved sooner.   Thanks.

## 2016-02-25 NOTE — ED Triage Notes (Signed)
Per [facility], pt from home, recent dx with DDD, restless leg, also hx of seizures and had one two weeks ago; Pt c/o pain in back, neck, and legs, tingling and weakness in bilateral legs, No fall, no etoh, no drugs, pt AAOx4, stroke screen negative.

## 2016-02-25 NOTE — ED Notes (Signed)
Pt given glass of water per Dr. Venora Maples

## 2016-02-25 NOTE — ED Notes (Signed)
Dr. campos at bedside

## 2016-02-25 NOTE — ED Notes (Signed)
Pt dropped urinal with sample on the ground. Will attempt recollect later

## 2016-02-25 NOTE — Telephone Encounter (Signed)
Yes, ok to put him in that slot, thanks!

## 2016-02-25 NOTE — Telephone Encounter (Signed)
Spoke with patient's wife. She states patient at [facility] right now due to problem with legs this morning. He started walking "drunk" and she states he may be admitted. She will call back if he wants to be moved to appt next week. She knows to ask for me.

## 2016-02-25 NOTE — ED Provider Notes (Signed)
[facility] DEPT Provider Note   CSN: [id number] Arrival date & time: 02/25/16  1152  First Provider Contact:  None       History   Chief Complaint Chief Complaint  Patient presents with  . Back Pain    HPI Patrick Gregory is a 55 y.o. male.  Patient presents to the emergency department with difficulty walking today.  He was seen and evaluated by his primary care physician underwent MRI of his brain and spine yesterday it was found to have no acute abnormality.  Patient states that he awoke and felt "drunk".  He states he had difficulty walking.  His family states that he seems somewhat confused from his baseline.   The history is provided by the patient, the spouse, a relative and medical records.   Patient is brought to the emergency department by family secondary to inability to walk today.  He states he feels like he is walking like he was drunk.  He has seen his primary care physician and saw him yesterday for some increasing low back pain as well as some right face paresthesias.  He underwent MRI of his brain and low back yesterday at [facility].  MRI of his brain demonstrated no acute abnormalities.  He did have some small annular tears on his lumbar spine.   Past Medical History:  Diagnosis Date  . Allergic rhinitis   . Arthritis    shoulders, back  . Asthma   . Bipolar disorder ([place])   . Chickenpox   . COPD (chronic obstructive pulmonary disease) ([place])   . Depression   . Low back pain   . Seizures ([place])    last one approx 18 mos ago.  No meds approx 1 yr.  . Sleep apnea    supposed to use CPAP. Does not have.  . Stiff neck    limited side to side turn.  Left side pain when looking up.  . Throat cancer [facility])     Patient Active Problem List   Diagnosis Date Noted  . Seizures ([place]) 02/24/2016  . Low back pain 02/24/2016  . COPD (chronic obstructive pulmonary disease) ([place]) 02/11/2016  . DOE (dyspnea on exertion) 02/11/2016  . Cough 02/11/2016  . Epigastric  discomfort 01/26/2016  . Hoarseness 10/08/2015  . GERD (gastroesophageal reflux disease) 10/08/2015  . Neck pain 10/08/2015  . Anxiety 10/08/2015  . Muscle cramps 10/08/2015    Past Surgical History:  Procedure Laterality Date  . DIRECT LARYNGOSCOPY Right 10/23/2015   Procedure: MICO DIRECT LARYNGOSCOPY WITH EXCISION RIGHT VOCAL CORD LESION;  Surgeon: Beverly Gust, MD;  Location: [facility];  Service: ENT;  Laterality: Right;  . THROAT SURGERY         Home Medications    Prior to Admission medications   Medication Sig Start Date End Date Taking? Authorizing Provider  albuterol (PROVENTIL HFA;VENTOLIN HFA) 108 (90 Base) MCG/ACT inhaler Inhale 2 puffs into the lungs every 6 (six) hours as needed for wheezing or shortness of breath. 02/11/16   Vishal Mungal, MD  budesonide-formoterol (SYMBICORT) 80-4.5 MCG/ACT inhaler Inhale 2 puffs into the lungs 2 (two) times daily. 02/11/16 02/12/16  Vishal Mungal, MD  budesonide-formoterol (SYMBICORT) 80-4.5 MCG/ACT inhaler Inhale 1 puff into the lungs 2 (two) times daily. 02/11/16   Vishal Mungal, MD  cyclobenzaprine (FLEXERIL) 10 MG tablet Take 10 mg by mouth at bedtime.    Historical Provider, MD  HYDROcodone-acetaminophen (NORCO/VICODIN) 5-325 MG tablet Take 1 tablet by mouth every 6 (six) hours as  needed for moderate pain. 02/24/16   Leone Haven, MD  ipratropium-albuterol (DUONEB) 0.5-2.5 (3) MG/3ML SOLN Take 3 mLs by nebulization 2 (two) times daily.    Historical Provider, MD  levETIRAcetam (KEPPRA) 500 MG tablet Take 1 tablet (500 mg total) by mouth 2 (two) times daily. 02/24/16   Leone Haven, MD  Lurasidone HCl 60 MG TABS Take 60 mg by mouth at bedtime.    Historical Provider, MD  omeprazole (PRILOSEC) 20 MG capsule Take 20 mg by mouth 2 (two) times daily before a meal.    Historical Provider, MD  pantoprazole (PROTONIX) 40 MG tablet Take 1 tablet (40 mg total) by mouth 2 (two) times daily before a meal. 10/07/15   Leone Haven, MD  ranitidine (ZANTAC) 300 MG tablet Take 300 mg by mouth at bedtime.    Historical Provider, MD  tiotropium (SPIRIVA) 18 MCG inhalation capsule Place 18 mcg into inhaler and inhale daily.    Historical Provider, MD  venlafaxine XR (EFFEXOR-XR) 150 MG 24 hr capsule Take 300 mg by mouth daily with breakfast.    Historical Provider, MD    Family History Family History  Problem Relation Age of Onset  . Alcoholism    . Arthritis    . Hyperlipidemia    . Heart disease    . Sudden death    . Kidney disease    . Mental illness      Social History Social History  Substance Use Topics  . Smoking status: Former Smoker    Packs/day: 1.00    Years: 35.00    Types: Cigarettes    Quit date: 05/01/2014  . Smokeless tobacco: Not on file  . Alcohol use 0.0 oz/week     Comment: occ     Allergies   Review of patient's allergies indicates no known allergies.   Review of Systems Review of Systems  All other systems reviewed and are negative.    Physical Exam Updated Vital Signs BP 114/90   Pulse 78   Temp 98.2 F (36.8 C) (Oral)   Resp 20   SpO2 99%   Physical Exam  Constitutional: He is oriented to person, place, and time. He appears well-developed and well-nourished.  HENT:  Head: Normocephalic and atraumatic.  Eyes: EOM are normal.  Neck: Normal range of motion.  Cardiovascular: Normal rate, regular rhythm, normal heart sounds and intact distal pulses.   Pulmonary/Chest: Effort normal and breath sounds normal. No respiratory distress.  Abdominal: Soft. He exhibits no distension. There is no tenderness.  Musculoskeletal: Normal range of motion.  Neurological: He is alert and oriented to person, place, and time. He displays no tremor. He exhibits normal muscle tone. Coordination and gait abnormal. GCS eye subscore is 4. GCS verbal subscore is 5. GCS motor subscore is 6.  Skin: Skin is warm and dry.  Psychiatric: Judgment normal. His mood appears anxious. His  speech is rapid and/or pressured. He is hyperactive. Thought content is not paranoid. Cognition and memory are normal. He expresses no homicidal and no suicidal ideation.  Nursing note and vitals reviewed.    ED Treatments / Results  Labs (all labs ordered are listed, but only abnormal results are displayed) Labs Reviewed  CBC - Abnormal; Notable for the following:       Result Value   WBC 11.4 (*)    All other components within normal limits  COMPREHENSIVE METABOLIC PANEL - Abnormal; Notable for the following:    Calcium 8.7 (*)  ALT 16 (*)    All other components within normal limits  ETHANOL    EKG  EKG Interpretation None       Radiology Mr Jeri Cos Wo Contrast  Result Date: 02/24/2016 CLINICAL DATA:  Recent history of seizures. Previous head injury. Intermittent RIGHT facial numbness. EXAM: MRI HEAD WITHOUT AND WITH CONTRAST TECHNIQUE: Multiplanar, multiecho pulse sequences of the brain and surrounding structures were obtained without and with intravenous contrast. CONTRAST:  MultiHance 18 mL. COMPARISON:  MRI lumbar spine reported separately. FINDINGS: The patient was unable to remain motionless for the exam. Small or subtle lesions could be overlooked. No evidence for acute infarction, hemorrhage, mass lesion, hydrocephalus, or extra-axial fluid. Mild subcortical and periventricular T2 and FLAIR hyperintensities, likely chronic microvascular ischemic change. Flow voids are maintained throughout the carotid, basilar, and vertebral arteries. There are no areas of chronic hemorrhage. Partial empty sella.  Mild tonsillar ectopia.  No pineal lesion. Post infusion, no abnormal enhancement of the brain or meninges. Major dural venous sinuses are patent. Thin-section imaging through the temporal lobes demonstrates no hippocampal atrophy or inflammatory process. Visualized calvarium, skull base, and upper cervical osseous structures unremarkable. Scalp and extracranial soft tissues,  orbits, sinuses, and mastoids show no acute process. IMPRESSION: Negative exam.  No acute intracranial findings. Electronically Signed   By: Staci Righter M.D.   On: 02/24/2016 20:46  Mr Lumbar Spine Wo Contrast  Result Date: 02/24/2016 CLINICAL DATA:  Low back pain. RIGHT leg pain, numbness and tingling. EXAM: MRI LUMBAR SPINE WITHOUT CONTRAST TECHNIQUE: Multiplanar, multisequence MR imaging of the lumbar spine was performed. No intravenous contrast was administered. COMPARISON:  None. FINDINGS: Segmentation:  Standard. Alignment:  Physiologic. Vertebrae:  No fracture, evidence of discitis, or bone lesion. Conus medullaris: Extends to the L1 level and appears normal. 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 No mass or hydronephrosis. Disc levels: L1-L2:  Normal L2-L3:  Normal L3-L4:  Normal L4-L5: Central annular bulge. Mild facet arthropathy. Annular tears are seen in both foramina, with subligamentous protrusions on the RIGHT and LEFT. In conjunction with facet hypertrophy, there is foraminal narrowing bilaterally which could affect either the RIGHT or LEFT L4 nerve root. L5-S1: Annular bulge. Mild facet arthropathy. No significant subarticular zone or foraminal zone narrowing. IMPRESSION: Potentially symptomatic annular tears with subligamentous protrusions at L4-5 bilaterally. Either L4 nerve root could be irritated. Correlate clinically as a cause of RIGHT leg pain. Electronically Signed   By: Staci Righter M.D.   On: 02/24/2016 19:55   Procedures Procedures (including critical care time)  Medications Ordered in ED Medications - No data to display   Initial Impression / Assessment and Plan / ED Course  I have reviewed the triage vital signs and the nursing notes.  Pertinent labs & imaging results that were available during my care of the patient were reviewed by me and considered in my medical decision making (see chart for details).  Clinical Course     Patient observed in the emergency department.  Patient was seen and evaluated by neurology.  Please see consultation note.  After sometime the emergency department the patient was able to ambulate without difficulty.  Unclear etiology.  Feeling better and walking normally and would like to go home.  May represent vertigo.  MRI obtained last night demonstrated no acute intracranial pathology.  Neurology does not think we need additional imaging at this time.  Final Clinical Impressions(s) / ED Diagnoses   Final diagnoses:  Ataxia  Bipolar affective disorder, currently manic, moderate (HCC)    New Prescriptions New Prescriptions   No medications on file     Jola Schmidt, MD 02/29/16 1621

## 2016-02-26 ENCOUNTER — Telehealth: Payer: Self-pay | Admitting: Family Medicine

## 2016-02-26 ENCOUNTER — Encounter: Payer: Self-pay | Admitting: Family Medicine

## 2016-02-26 ENCOUNTER — Ambulatory Visit: Payer: Medicare Other | Admitting: Speech Pathology

## 2016-02-26 NOTE — Telephone Encounter (Signed)
[facility]     Patient Name: Cebert Wist Client [facility]    Physician [name] - MD    Contact Type Call    Who Is Calling Patient / Member / Family / Caregiver    Call Type Triage / Clinical    Caller Name Marguarite Arbour     Relationship To Patient Partner    Return Phone Number [phone] (Primary)    Chief Complaint Dizziness  Gender: Male Reason for Call Symptomatic / Request for Health Information  DOB: 22-Sep-1960  Initial Comment Caller says her fiance seems to have a reaction to the meds he has been on for a long time. He is acting like he is drunk. Was at the ER yesterday; they said it is from him being bi-polat but it does not make sense  Age: 55 Y 68 M 19 D Appointment Disposition EMR Appointment Not Necessary  Return Phone Number: [phone] (Primary) Info pasted into Epic Yes  Address:  [address]  City/State/Zip: [place] [address] Translation No    Nurse Assessment  Nurse: Amalia Hailey, RN, Melissa Date/Time (Eastern Time): 02/26/2016 3:06:36 PM  Confirm and document reason for call. If symptomatic, describe symptoms. You must click the next button to save text entered. ---Caller says her fianc seems to have a reaction to the meds he has been on for a long time. He is acting like he is drunk. Was at the ER yesterday did several test; they said it is from him being bi-polar but it does not make sense.  Has the patient traveled out of the country within the last 30 days? ---Not Applicable  Does the patient have any new or worsening symptoms? ---Yes  Will a triage be completed? ---Yes  Related visit to physician within the last 2 weeks? ---Yes  Does the PT have any chronic conditions? (i.e. diabetes, asthma, etc.) ---Yes  List chronic conditions. ---Bipolar, PTSD,  Fibromyalgia, COPD, sleep apnea, lower back and cervical problems, seizure,  Is this a behavioral health or substance abuse call? ---No    Guidelines      Guideline Title Affirmed Question Affirmed Notes Nurse Date/Time (Eastern Time)  Dizziness - Vertigo Severe headache (e.g., excruciating) (Exception: similar to previous migraines)  Amalia Hailey, RN, Melissa 02/26/2016 3:17:59 PM  Disp. Time Eilene Ghazi Time) Disposition Final User         02/26/2016 3:25:10 PM Go to ED Now (or PCP triage) Lanelle Bal, RN, Lenna Sciara         Caller Understands: Yes   Disagree/Comply: Comply      Care Advice Given Per Guideline         GO TO ED NOW (OR PCP TRIAGE): * IF NO PCP TRIAGE: You need to be seen. Go to the [facility] at _[facility] Hospital within the next hour. Leave as soon as you can. NOTE TO TRIAGER - DRIVING: BRING MEDICINES: * Please bring a list of your current medicines when you go to see the doctor.             Referrals   [facility] - ED            Comments  User: Colin Ina, RN Date/Time Eilene Ghazi Time): 02/26/2016 3:31:31 PM  Caller identified herself as Marguarite Arbour and reports she is the  pt's fiance. Obtained a verbal consent from the pt. Alvern Ille to speak to her about his medical condition and information.   Referrals

## 2016-02-26 NOTE — Telephone Encounter (Signed)
LM for patient to return call.

## 2016-02-26 NOTE — Telephone Encounter (Signed)
Noted. Please attempt to contact the patient to ensure that he is evaluated.

## 2016-02-26 NOTE — Telephone Encounter (Signed)
[facility]    --------------------------------------------------------------------------------   Patient Name: Patrick Gregory  Gender: Male  DOB: 1960-10-01   Age: 55 Y 31 M 19 D  Return Phone Number: [phone] (Primary)  Address:     City/State/Zip: [place]  [address]   Client [facility]  Client [facility] [place]  Physician [name] - MD  Contact Type Call  Who Is Calling Patient / Member / Family / Caregiver  Call Type Triage / Clinical  Caller Name Marguarite Arbour   Relationship To Patient Partner  Return Phone Number [phone] (Primary)  Chief Complaint Dizziness  Reason for Call Symptomatic / Request for [place] says her fiance seems to have a reaction to the meds he has been on for a long time. He is acting like he is drunk. Was at the ER yesterday; they said it is from him being bi-polat but it does not make sense  Appointment Disposition EMR Appointment Not Necessary  Info pasted into Epic Yes  PreDisposition 911  Translation No       Nurse Assessment  Nurse: Amalia Hailey, RN, Melissa Date/Time (Eastern Time): 02/26/2016 3:06:36 PM  Confirm and document reason for call. If symptomatic, describe symptoms. You must click the next button to save text entered. ---Caller says her fianc seems to have a reaction to the meds he has been on for a long time. He is acting like he is drunk. Was at the ER yesterday did several test; they said it is from him being bi-polar but it does not make sense.    Has the patient traveled out of the country within the last 30 days? ---Not Applicable    Does the patient have any new or worsening symptoms? ---Yes    Will a triage be completed? ---Yes    Related visit to physician within the last 2 weeks? ---Yes    Does the PT have  any chronic conditions? (i.e. diabetes, asthma, etc.) ---Yes    List chronic conditions. ---Bipolar, PTSD, Fibromyalgia, COPD, sleep apnea, lower back and cervical problems, seizure,    Is this a behavioral health or substance abuse call? ---No           Guidelines          Guideline Title Affirmed Question Affirmed Notes Nurse Date/Time (Eastern Time)  Dizziness - Vertigo Severe headache (e.g., excruciating) (Exception: similar to previous migraines)    Amalia Hailey, RN, Melissa 02/26/2016 3:17:59 PM    Disp. Time Eilene Ghazi Time) Disposition Final User         02/26/2016 3:25:10 PM Go to ED Now (or PCP triage) Lanelle Bal, RN, Lenna Sciara            Caller Understands: Yes  Disagree/Comply: Comply       Care Advice Given Per Guideline        GO TO ED NOW (OR PCP TRIAGE): * IF NO PCP TRIAGE: You need to be seen. Go to the [facility] at [facility]____________ Hospital within the next hour. Leave as soon as you can. NOTE TO TRIAGER - DRIVING: BRING MEDICINES: * Please bring a list of your current medicines when you go to see the doctor.        --------------------------------------------------------------------------------         Comments  User: Colin Ina, RN Date/Time Eilene Ghazi Time): 02/26/2016  3:31:31 PM  Caller identified herself as Marguarite Arbour and reports she is the pt's fiance. Obtained a verbal consent from the pt. [name] to speak to her about his medical condition and information.    Referrals  [facility] - ED

## 2016-02-26 NOTE — Telephone Encounter (Signed)
FYI

## 2016-02-29 ENCOUNTER — Ambulatory Visit: Payer: TRICARE For Life (TFL) | Admitting: Speech Pathology

## 2016-03-01 ENCOUNTER — Ambulatory Visit: Payer: Medicare Other | Admitting: Speech Pathology

## 2016-03-02 ENCOUNTER — Ambulatory Visit
Admission: RE | Admit: 2016-03-02 | Discharge: 2016-03-02 | Disposition: A | Discharge status: Home / Self Care | Payer: Medicare Other | Source: Ambulatory Visit | Attending: Neurosurgery | Admitting: Neurosurgery

## 2016-03-02 DIAGNOSIS — M542 Cervicalgia: Secondary | ICD-10-CM | POA: Diagnosis not present

## 2016-03-02 DIAGNOSIS — M4722 Other spondylosis with radiculopathy, cervical region: Secondary | ICD-10-CM

## 2016-03-02 MED ORDER — DIAZEPAM 5 MG PO TABS
10.0000 mg | ORAL_TABLET | Freq: Once | ORAL | Status: AC
  Administered 2016-03-02: 10 mg via ORAL

## 2016-03-02 MED ORDER — TRIAMCINOLONE ACETONIDE 40 MG/ML IJ SUSP (RADIOLOGY)
60.0000 mg | Freq: Once | INTRAMUSCULAR | Status: AC
  Administered 2016-03-02: 60 mg via EPIDURAL

## 2016-03-02 MED ORDER — IOPAMIDOL (ISOVUE-M 300) INJECTION 61%
1.0000 mL | Freq: Once | INTRAMUSCULAR | Status: AC | PRN
  Administered 2016-03-02: 1 mL via EPIDURAL

## 2016-03-03 ENCOUNTER — Encounter: Payer: Self-pay | Admitting: Neurology

## 2016-03-03 ENCOUNTER — Ambulatory Visit (INDEPENDENT_AMBULATORY_CARE_PROVIDER_SITE_OTHER): Payer: Medicare Other | Admitting: Neurology

## 2016-03-03 VITALS — BP 122/80 | HR 84 | Ht 68.5 in | Wt 199.3 lb

## 2016-03-03 DIAGNOSIS — R404 Transient alteration of awareness: Secondary | ICD-10-CM

## 2016-03-03 DIAGNOSIS — F4310 Post-traumatic stress disorder, unspecified: Secondary | ICD-10-CM

## 2016-03-03 NOTE — Patient Instructions (Signed)
1. Schedule 1-hour sleep-deprived EEG 2. Continue Keppra 500mg  twice a day 3. Call our office for any change in symptoms, follow-up in 2 months  Seizure Precautions: 1. If medication has been prescribed for you to prevent seizures, take it exactly as directed.  Do not stop taking the medicine without talking to your doctor first, even if you have not had a seizure in a long time.   2. Avoid activities in which a seizure would cause danger to yourself or to others.  Don't operate dangerous machinery, swim alone, or climb in high or dangerous places, such as on ladders, roofs, or girders.  Do not drive unless your doctor says you may.  3. If you have any warning that you may have a seizure, lay down in a safe place where you can't hurt yourself.    4.  No driving for 6 months from last seizure, as per [facility].   Please refer to the following link on the [place] website for more information: http://www.epilepsyfoundation.org/answerplace/Social/driving/drivingu.cfm   5.  Maintain good sleep hygiene. Avoid alcohol  6.  Contact your doctor if you have any problems that may be related to the medicine you are taking.  7.  Call 911 and bring the patient back to the ED if:        A.  The seizure lasts longer than 5 minutes.       B.  The patient doesn't awaken shortly after the seizure  C.  The patient has new problems such as difficulty seeing, speaking or moving  D.  The patient was injured during the seizure  E.  The patient has a temperature over 102 F (39C)  F.  The patient vomited and now is having trouble breathing

## 2016-03-03 NOTE — Progress Notes (Signed)
NEUROLOGY CONSULTATION NOTE  Patrick Gregory MRN: [id number] DOB: Mar 16, 1961  Referring provider: Dr. [name] Primary care provider: Dr. [name]  Reason for consult:  seizures  Dear Dr Caryl Bis:  Thank you for your kind referral of Patrick Gregory for consultation of the above symptoms. Although his history is well known to you, please allow me to reiterate it for the purpose of our medical record. The patient was accompanied to the clinic by his fiancee who also provides collateral information. Records and images were personally reviewed where available.  HISTORY OF PRESENT ILLNESS: This is a pleasant 55 year old right-handed man with a history of COPD, chronic neck and back pain, PTSD, presenting for evaluation of seizures. He reports that the first seizure occurred 3-4 years ago, he woke up on the floor then 2-3 hours later passed out again. He denied any prior warning symptoms. He was started on an unrecalled seizure medication which he took for a year then self-discontinued because he had been seizure-free.  He was event-free until January 2017 when he had 3 episodes in a week. The first episode he was sitting on the couch when his head went back and he became unresponsive for a few seconds, eyes were closed. The next day, he was working on something on the wall when his uncle noticed he looked lost. He called to him and he snapped back. The third episode occurred 2 days later, again while sitting on the couch, his head went back with eyes closed, then he had mild jerking of both arms for a few seconds. No tongue bite or incontinence. When he came to, he told his fiancee he had a headache and felt nauseated. He had another mild episode 3 weeks ago. His is amnestic of these episodes, his whole body feels weak after, no focal weakness. He and his fiancee deny any staring/unresponsive episodes, no gaps in time. He denies any  olfactory/gustatory hallucinations, deja vu, rising  epigastric sensation, myoclonic jerks. When he turns his head to the right, he has numbness over the left arm. He reports having degenerative disc disease and sees neurosurgery. He was started on Keppra 500mg  and denies any side effects since taking it the past 3 days. He was in the ER last 02/25/16 for gait difficulty, he was staggering, walking like was drunk. He was evaluated by Neurology at [facility], no focal weakness seen, finger to nose testing appeared functional. Symptoms lasted 2-3 days. He had not started Keppra when these symptoms began. He had an MRI brain with and without contrast which I personally reviewed, there were no acute changes, hippocampi symmetric with no abnormal signal or enhancement seen. He had a head CT the next day with no acute changes. The gait instability has since resolved.  He has chronic neck and back pain, with shooting pain and numbness down his left side when he turns his head to the right. He denies any headaches, dizziness, diplopia, dysarthria/dysphagia, bowel/bladder dysfunction. 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 He has had several head injuries with loss of consciousness while he was in [employer], otherwise he had a normal birth and early development.  There is no history of febrile convulsions, CNS infections such as meningitis/encephalitis, neurosurgical procedures, or family history of seizures.  PAST MEDICAL HISTORY: Past Medical History:  Diagnosis Date  . Allergic rhinitis   . Arthritis  shoulders, back  . Asthma   . Bipolar disorder ([place])   . Chickenpox   . COPD (chronic obstructive pulmonary disease) ([place])   . Depression   . Low back pain   . Seizures ([place])    last one approx 18 mos ago.  No meds approx 1 yr.  . Sleep apnea    supposed to use CPAP. Does not have.  . Stiff neck    limited side to side turn.  Left side pain  when looking up.  . Throat cancer ([place])     PAST SURGICAL HISTORY: Past Surgical History:  Procedure Laterality Date  . DIRECT LARYNGOSCOPY Right 10/23/2015   Procedure: MICO DIRECT LARYNGOSCOPY WITH EXCISION RIGHT VOCAL CORD LESION;  Surgeon: Beverly Gust, MD;  Location: [facility];  Service: ENT;  Laterality: Right;  . THROAT SURGERY      MEDICATIONS: Current Outpatient Prescriptions on File Prior to Visit  Medication Sig Dispense Refill  . albuterol (PROVENTIL HFA;VENTOLIN HFA) 108 (90 Base) MCG/ACT inhaler Inhale 2 puffs into the lungs every 6 (six) hours as needed for wheezing or shortness of breath. 1 Inhaler 2  . budesonide-formoterol (SYMBICORT) 80-4.5 MCG/ACT inhaler Inhale 2 puffs into the lungs 2 (two) times daily. 1 Inhaler 0  . budesonide-formoterol (SYMBICORT) 80-4.5 MCG/ACT inhaler Inhale 1 puff into the lungs 2 (two) times daily. 1 Inhaler 2  . cyclobenzaprine (FLEXERIL) 10 MG tablet Take 10 mg by mouth at bedtime.    [place] Kitchen HYDROcodone-acetaminophen (NORCO/VICODIN) 5-325 MG tablet Take 1 tablet by mouth every 6 (six) hours as needed for moderate pain. 30 tablet 0  . ipratropium-albuterol (DUONEB) 0.5-2.5 (3) MG/3ML SOLN Take 3 mLs by nebulization 2 (two) times daily.    Marland Kitchen levETIRAcetam (KEPPRA) 500 MG tablet Take 1 tablet (500 mg total) by mouth 2 (two) times daily. 60 tablet 1  . Lurasidone HCl 60 MG TABS Take 60 mg by mouth at bedtime.    Marland Kitchen omeprazole (PRILOSEC) 20 MG capsule Take 20 mg by mouth 2 (two) times daily before a meal.    . pantoprazole (PROTONIX) 40 MG tablet Take 1 tablet (40 mg total) by mouth 2 (two) times daily before a meal. 60 tablet 3  . ranitidine (ZANTAC) 300 MG tablet Take 300 mg by mouth at bedtime.    Marland Kitchen venlafaxine XR (EFFEXOR-XR) 150 MG 24 hr capsule Take 300 mg by mouth daily with breakfast.     No current facility-administered medications on file prior to visit.     ALLERGIES: Allergies  Allergen Reactions  . Latex Itching     FAMILY HISTORY: Family History  Problem Relation Age of Onset  . Alcoholism    . Arthritis    . Hyperlipidemia    . Heart disease    . Sudden death    . Kidney disease    . Mental illness      SOCIAL HISTORY: Social History   Social History  . Marital status: Single    Spouse name: N/A  . Number of children: N/A  . Years of education: N/A   Occupational History  . Not on file.   Social History Main Topics  . Smoking status: Former Smoker    Packs/day: 1.00    Years: 35.00    Types: Cigarettes    Quit date: 05/01/2014  . Smokeless tobacco: Not on file  . Alcohol use 0.0 oz/week     Comment: occ  . Drug use:   . Sexual activity: Not on file  Other Topics Concern  . Not on file   Social History Narrative  . No narrative on file    REVIEW OF SYSTEMS: Constitutional: No fevers, chills, or sweats, no generalized fatigue, change in appetite Eyes: No visual changes, double vision, eye pain Ear, nose and throat: No hearing loss, ear pain, nasal congestion, sore throat Cardiovascular: No chest pain, palpitations Respiratory:  No shortness of breath at rest or with exertion, wheezes GastrointestinaI: No nausea, vomiting, diarrhea, abdominal pain, fecal incontinence Genitourinary:  No dysuria, urinary retention or frequency Musculoskeletal:  + neck pain, back pain Integumentary: No rash, pruritus, skin lesions Neurological: as above Psychiatric: No depression, insomnia, anxiety Endocrine: No palpitations, fatigue, diaphoresis, mood swings, change in appetite, change in weight, increased thirst Hematologic/Lymphatic:  No anemia, purpura, petechiae. Allergic/Immunologic: no itchy/runny eyes, nasal congestion, recent allergic reactions, rashes  PHYSICAL EXAM: Vitals:   03/03/16 1126  BP: 122/80  Pulse: 84   General: No acute distress Head:  Normocephalic/atraumatic Eyes: Fundoscopic exam shows bilateral sharp discs, no vessel changes, exudates, or  hemorrhages Neck: supple, no paraspinal tenderness, full range of motion Back: No paraspinal tenderness Heart: regular rate and rhythm Lungs: Clear to auscultation bilaterally. Vascular: No carotid bruits. Skin/Extremities: No rash, no edema Neurological Exam: Mental status: alert and oriented to person, place, and time, no dysarthria or aphasia, Fund of knowledge is appropriate.  Remote memory intact.1/3 delayed recall.  Attention and concentration are normal.    Able to name objects and repeat phrases. Cranial nerves: CN I: not tested CN II: pupils equal, round and reactive to light, visual fields intact, fundi unremarkable. CN III, IV, VI:  full range of motion, no nystagmus, no ptosis CN V: facial sensation intact CN VII: upper and lower face symmetric CN VIII: hearing intact to finger rub CN IX, X: gag intact, uvula midline CN XI: sternocleidomastoid and trapezius muscles intact CN XII: tongue midline Bulk & Tone: normal, no fasciculations. Motor: 5/5 throughout with no pronator drift. Sensation: intact to light touch, cold, pin, vibration and joint position sense.  No extinction to double simultaneous stimulation.  Romberg test negative Deep Tendon Reflexes: +2 throughout, no ankle clonus Plantar responses: downgoing bilaterally Cerebellar: no incoordination on finger to nose, heel to shin. No dysdiadochokinesia Gait: narrow-based and steady, mild difficulty with tandem walk but able Tremor: none  IMPRESSION: This is a pleasant 55 year old right-handed man with a history of COPD, chronic neck and back pain, PTSD, presenting for evaluation of seizures. He reports a having a seizure 3-4 years ago, then again last January 2017 when he had 3 in the span of a few days. Last episode was 3 weeks ago. The semiology of some seizures with his eyes closed does raise the possibility of psychogenic non-epileptic events as suggested by his therapist stemming from PTSD, however focal seizures  with impaired awareness is also considered. His MRI brain and neurological exam are normal. A 1-hour sleep-deprived EEG will be ordered. He was advised to continue Keppra 500mg  BID for now. If seizures recur on medication, a 48-hour EEG will be ordered to further classify his seizures. He was advised to continue working with Behavioral Medicine. [place] driving laws were discussed with the patient, and he knows to stop driving after a seizure, until 6 months seizure-free. He will follow-up in 2 months and knows to call for any changes.   Thank you for allowing me to participate in the care of this patient. Please do not hesitate to call for any questions  or concerns.   Ellouise Newer, M.D.  CC: Dr. Caryl Bis

## 2016-03-04 ENCOUNTER — Ambulatory Visit: Payer: Medicare Other | Admitting: Speech Pathology

## 2016-03-04 ENCOUNTER — Encounter: Payer: Self-pay | Admitting: Neurology

## 2016-03-04 DIAGNOSIS — R404 Transient alteration of awareness: Secondary | ICD-10-CM | POA: Insufficient documentation

## 2016-03-04 DIAGNOSIS — F4310 Post-traumatic stress disorder, unspecified: Secondary | ICD-10-CM | POA: Insufficient documentation

## 2016-03-04 HISTORY — DX: Post-traumatic stress disorder, unspecified: F43.10

## 2016-03-07 ENCOUNTER — Ambulatory Visit: Payer: Medicare Other | Admitting: Speech Pathology

## 2016-03-09 ENCOUNTER — Telehealth: Payer: Self-pay | Admitting: Family Medicine

## 2016-03-09 MED ORDER — HYDROCODONE-ACETAMINOPHEN 5-325 MG PO TABS: 1.0000 | ORAL_TABLET | Freq: Four times a day (QID) | ORAL | 0 refills | Status: DC | PRN

## 2016-03-09 NOTE — Telephone Encounter (Signed)
Last OV and last filled on 02/24/16, #30.... Okay to refill?

## 2016-03-09 NOTE — Telephone Encounter (Signed)
Refill given. Please let him know it is at the front for pick up.

## 2016-03-09 NOTE — Telephone Encounter (Signed)
Notified pt. 

## 2016-03-09 NOTE — Telephone Encounter (Signed)
Pt fiance called about needing a refill for HYDROcodone-acetaminophen (NORCO/VICODIN) 5-325 MG tablet.   Call pt when it's ready @ [phone]. Thank you!

## 2016-03-11 ENCOUNTER — Ambulatory Visit: Payer: TRICARE For Life (TFL) | Admitting: Speech Pathology

## 2016-03-14 ENCOUNTER — Ambulatory Visit: Payer: TRICARE For Life (TFL) | Admitting: Speech Pathology

## 2016-03-14 ENCOUNTER — Other Ambulatory Visit: Payer: Self-pay | Admitting: Neurosurgery

## 2016-03-14 DIAGNOSIS — M4722 Other spondylosis with radiculopathy, cervical region: Secondary | ICD-10-CM

## 2016-03-16 ENCOUNTER — Ambulatory Visit (INDEPENDENT_AMBULATORY_CARE_PROVIDER_SITE_OTHER): Payer: Medicare Other | Admitting: Neurology

## 2016-03-16 DIAGNOSIS — R404 Transient alteration of awareness: Secondary | ICD-10-CM | POA: Diagnosis not present

## 2016-03-17 ENCOUNTER — Ambulatory Visit: Payer: TRICARE For Life (TFL) | Admitting: Speech Pathology

## 2016-03-18 NOTE — Procedures (Signed)
ELECTROENCEPHALOGRAM REPORT  Date of Study: 03/16/2016  Patient's Name: Patrick Gregory MRN: [id number] Date of Birth: [birth year]  Referring Provider: Dr. Ellouise Newer  Clinical History: This is a 55 year old man seizure-free for 3-4 years with recent recurrence of seizures.  Medications: Keppra, Albuterol, Symbicort, Flexeril,  Norco/Vicodin, Duoneb, Lurasidone HCl, Prilosec, Protonix, Zantac, Effexor XR  Technical Summary: A multichannel digital 1-hour sleep-deprived EEG recording measured by the international 10-20 system with electrodes applied with paste and impedances below 5000 ohms performed in our laboratory with EKG monitoring in an awake and asleep patient.  Hyperventilation and photic stimulation were performed.  The digital EEG was referentially recorded, reformatted, and digitally filtered in a variety of bipolar and referential montages for optimal display.    Description: The patient is awake and asleep during the recording.  During maximal wakefulness, there is a symmetric, medium voltage 9 Hz posterior dominant rhythm that attenuates with eye opening.  There is occasional focal 4-5 Hz theta slowing seen over the left temporal region,at times sharply contoured without clear epileptogenic potential.  During drowsiness and sleep, there is an increase in theta slowing of the background.  Vertex waves and symmetric sleep spindles were seen.  Hyperventilation and photic stimulation did not elicit any abnormalities.  There were no clear epileptiform discharges or electrographic seizures seen.    EKG lead was unremarkable.  Impression: This 1-hour awake and asleep EEG is abnormal due to occasional focal slowing over the left temporal region.  Clinical Correlation of the above findings indicates focal cerebral dysfunction over the left temporal region suggestive of underlying structural or physiologic abnormality. The absence of epileptiform discharges does not exclude a clinical  diagnosis of epilepsy.  If further clinical questions remain, prolonged EEG may be helpful.  Clinical correlation is advised.   Ellouise Newer, M.D.

## 2016-03-23 ENCOUNTER — Telehealth: Payer: Self-pay | Admitting: *Deleted

## 2016-03-23 ENCOUNTER — Ambulatory Visit: Payer: TRICARE For Life (TFL)

## 2016-03-23 ENCOUNTER — Other Ambulatory Visit: Payer: Self-pay | Admitting: Family Medicine

## 2016-03-23 MED ORDER — HYDROCODONE-ACETAMINOPHEN 5-325 MG PO TABS: 1.0000 | ORAL_TABLET | Freq: Four times a day (QID) | ORAL | 0 refills | Status: DC | PRN

## 2016-03-23 NOTE — Telephone Encounter (Signed)
LM for patient that RX was up front for patient to pick up

## 2016-03-23 NOTE — Telephone Encounter (Signed)
Rx refilled. Defer future refills to PCP.

## 2016-03-23 NOTE — Telephone Encounter (Signed)
Pt will need a medication refill for hydrocodone

## 2016-03-23 NOTE — Telephone Encounter (Signed)
Refill request for Hydrocodone, last seen 9AUG2017, last filled 9AUG2017.  Please advise.

## 2016-03-24 ENCOUNTER — Ambulatory Visit
Admission: RE | Admit: 2016-03-24 | Discharge: 2016-03-24 | Disposition: A | Discharge status: Home / Self Care | Payer: Medicare Other | Source: Ambulatory Visit | Attending: Neurosurgery | Admitting: Neurosurgery

## 2016-03-24 VITALS — BP 150/90 | HR 90

## 2016-03-24 DIAGNOSIS — M47812 Spondylosis without myelopathy or radiculopathy, cervical region: Secondary | ICD-10-CM | POA: Diagnosis not present

## 2016-03-24 DIAGNOSIS — M4722 Other spondylosis with radiculopathy, cervical region: Secondary | ICD-10-CM

## 2016-03-24 MED ORDER — IOPAMIDOL (ISOVUE-M 300) INJECTION 61%
1.0000 mL | Freq: Once | INTRAMUSCULAR | Status: AC | PRN
  Administered 2016-03-24: 1 mL via EPIDURAL

## 2016-03-24 MED ORDER — DIAZEPAM 5 MG PO TABS
10.0000 mg | ORAL_TABLET | Freq: Once | ORAL | Status: AC
  Administered 2016-03-24: 10 mg via ORAL

## 2016-03-24 MED ORDER — TRIAMCINOLONE ACETONIDE 40 MG/ML IJ SUSP (RADIOLOGY)
60.0000 mg | Freq: Once | INTRAMUSCULAR | Status: AC
  Administered 2016-03-24: 60 mg via EPIDURAL

## 2016-03-24 NOTE — Discharge Instructions (Signed)

## 2016-03-29 ENCOUNTER — Ambulatory Visit: Payer: TRICARE For Life (TFL)

## 2016-04-07 ENCOUNTER — Telehealth: Payer: Self-pay | Admitting: Family Medicine

## 2016-04-07 DIAGNOSIS — G8929 Other chronic pain: Secondary | ICD-10-CM

## 2016-04-07 DIAGNOSIS — M542 Cervicalgia: Principal | ICD-10-CM

## 2016-04-07 MED ORDER — HYDROCODONE-ACETAMINOPHEN 5-325 MG PO TABS: 1.0000 | ORAL_TABLET | Freq: Four times a day (QID) | ORAL | 0 refills | Status: DC | PRN

## 2016-04-07 NOTE — Telephone Encounter (Signed)
Refill can be given. Please place at front. I will also refer him to pain management.

## 2016-04-07 NOTE — Telephone Encounter (Signed)
Please advise  Refill, thanks

## 2016-04-07 NOTE — Telephone Encounter (Signed)
Pt wife called about needing a refill for HYDROcodone-acetaminophen (NORCO/VICODIN) 5-325 MG tablet. Pt was told to call a couple of days in advance when before he ran out.   Please call pt when it's ready @ [phone]. Thank you!

## 2016-04-11 ENCOUNTER — Ambulatory Visit: Payer: TRICARE For Life (TFL) | Admitting: Internal Medicine

## 2016-04-11 ENCOUNTER — Ambulatory Visit: Payer: TRICARE For Life (TFL)

## 2016-04-12 DIAGNOSIS — F3162 Bipolar disorder, current episode mixed, moderate: Secondary | ICD-10-CM | POA: Diagnosis not present

## 2016-04-22 ENCOUNTER — Telehealth: Payer: Self-pay | Admitting: *Deleted

## 2016-04-22 MED ORDER — HYDROCODONE-ACETAMINOPHEN 5-325 MG PO TABS: 1.0000 | ORAL_TABLET | Freq: Four times a day (QID) | ORAL | 0 refills | Status: DC | PRN

## 2016-04-22 NOTE — Telephone Encounter (Signed)
Refill given. Patient has been referred to pain management. Please check to see when his appointment is. Thanks.

## 2016-04-22 NOTE — Telephone Encounter (Signed)
Please advise, this was just filled on 9/7?

## 2016-04-22 NOTE — Telephone Encounter (Signed)
Pt requested a medication refill for Hydrocodone  Pt contact [phone]

## 2016-04-22 NOTE — Telephone Encounter (Signed)
Notified patient. thanks 

## 2016-04-25 ENCOUNTER — Other Ambulatory Visit: Payer: Self-pay | Admitting: Neurosurgery

## 2016-04-25 DIAGNOSIS — M4722 Other spondylosis with radiculopathy, cervical region: Secondary | ICD-10-CM

## 2016-04-28 ENCOUNTER — Ambulatory Visit: Payer: TRICARE For Life (TFL) | Admitting: Neurology

## 2016-04-29 ENCOUNTER — Ambulatory Visit (INDEPENDENT_AMBULATORY_CARE_PROVIDER_SITE_OTHER): Payer: Medicare Other | Admitting: *Deleted

## 2016-04-29 DIAGNOSIS — J449 Chronic obstructive pulmonary disease, unspecified: Secondary | ICD-10-CM

## 2016-04-29 DIAGNOSIS — R0609 Other forms of dyspnea: Secondary | ICD-10-CM

## 2016-04-29 LAB — PULMONARY FUNCTION TEST
DL/VA % pred: 80 %
DL/VA: 3.66 ml/min/mmHg/L
DLCO UNC % PRED: 89 %
DLCO unc: 27.59 ml/min/mmHg
FEF 25-75 PRE: 2.95 L/s
FEF 25-75 Post: 3.1 L/sec
FEF2575-%CHANGE-POST: 5 %
FEF2575-%Pred-Post: 103 %
FEF2575-%Pred-Pre: 98 %
FEV1-%Change-Post: 4 %
FEV1-%PRED-POST: 86 %
FEV1-%Pred-Pre: 82 %
FEV1-Post: 3.09 L
FEV1-Pre: 2.95 L
FEV1FVC-%CHANGE-POST: 2 %
FEV1FVC-%Pred-Pre: 105 %
FEV6-%CHANGE-POST: 2 %
FEV6-%PRED-PRE: 82 %
FEV6-%Pred-Post: 84 %
FEV6-POST: 3.76 L
FEV6-PRE: 3.67 L
FEV6FVC-%Change-Post: 0 %
FEV6FVC-%PRED-PRE: 104 %
FEV6FVC-%Pred-Post: 104 %
FVC-%Change-Post: 1 %
FVC-%Pred-Post: 80 %
FVC-%Pred-Pre: 79 %
FVC-POST: 3.76 L
FVC-Pre: 3.7 L
POST FEV6/FVC RATIO: 100 %
Post FEV1/FVC ratio: 82 %
Pre FEV1/FVC ratio: 80 %
Pre FEV6/FVC Ratio: 100 %

## 2016-04-29 NOTE — Progress Notes (Signed)
SMW performed today. 

## 2016-04-29 NOTE — Progress Notes (Signed)
PFT performed today with nitrogen washout. 

## 2016-05-02 ENCOUNTER — Ambulatory Visit (INDEPENDENT_AMBULATORY_CARE_PROVIDER_SITE_OTHER): Payer: Medicare Other | Admitting: Internal Medicine

## 2016-05-02 ENCOUNTER — Encounter: Payer: Self-pay | Admitting: Internal Medicine

## 2016-05-02 VITALS — BP 128/72 | HR 98 | Ht 69.0 in | Wt 203.8 lb

## 2016-05-02 DIAGNOSIS — R059 Cough, unspecified: Secondary | ICD-10-CM

## 2016-05-02 DIAGNOSIS — F17210 Nicotine dependence, cigarettes, uncomplicated: Secondary | ICD-10-CM | POA: Diagnosis not present

## 2016-05-02 DIAGNOSIS — R05 Cough: Secondary | ICD-10-CM

## 2016-05-02 DIAGNOSIS — J432 Centrilobular emphysema: Secondary | ICD-10-CM

## 2016-05-02 MED ORDER — BUDESONIDE-FORMOTEROL FUMARATE 80-4.5 MCG/ACT IN AERO
2.0000 | INHALATION_SPRAY | Freq: Two times a day (BID) | RESPIRATORY_TRACT | 3 refills | Status: DC
Start: 2016-05-02 — End: 2017-06-05

## 2016-05-02 MED ORDER — ALBUTEROL SULFATE HFA 108 (90 BASE) MCG/ACT IN AERS: 2.0000 | INHALATION_SPRAY | Freq: Four times a day (QID) | RESPIRATORY_TRACT | 2 refills | Status: DC | PRN

## 2016-05-02 NOTE — Assessment & Plan Note (Signed)
Tobacco Cessation - Counseling regarding benefits of smoking cessation strategies was provided for more than 3 min. - Educated that at this time smoking- cessation represents the single most important step that patient can take to enhance the length and quality of live. - Educated patient regarding alternatives of behavior interventions, pharmacotherapy including NRT and non-nicotine therapy such, and combinations of both. - Patient at this time will try to quit on his own, down to 2 cigs per day.

## 2016-05-02 NOTE — Patient Instructions (Addendum)
Follow up in 3-4 months - cont with symbicort (80/4.5) - 2 puffs BID. -gargle and rinse after each use.  - albuterol inhaler - 2puff every 3-4 hours as needed for shortness of breath\wheezing\recurrent cough - diet and exercise as tolerated - please continue to cut back on smoking and eventually quit.

## 2016-05-02 NOTE — Assessment & Plan Note (Addendum)
COPD, Stage B Meds - Symbicort (80/4.5), 2 puff bid. Resuce inhaler MMRC-0 CAT-19  Still smoking, about 2 cigs per day.   Plan: -Continue with Symbicort 2 puffs twice a day Continue with rescue inhaler as directed Cut back on smoking and eventually quit.

## 2016-05-02 NOTE — Assessment & Plan Note (Signed)
Multifactorial: Vocal cord lesion, status post removal, COPD, prior tobacco abuse.  I suspect the majority of his chronic cough at this time over the last 3 months is due to vocal cord irritation causing bronchospasms and bronchial airway irritation. This could be triggering his COPD which is very mild symptomatically speaking.  COPD optimization with addition of Symbicort.  Plan: -Continue with speech therapy for vocal cord issues -Maintenance and rescue meds given to patient. Continue with Symbicort.

## 2016-05-02 NOTE — Progress Notes (Signed)
[facility] Consultation      MRN# [id number] Patrick Gregory 02/22/1961   CC: Chief Complaint  Patient presents with  . Follow-up    MMRC 0:SMW/PFT results. pt states breathing has improved since last OV. pt c/o dry cough at times prod with green mucus. denies any sob, wheezing or cp/tightness      Brief History: 55 year old male with COPD, recently moved from [place], following up with pulmonary for COPD optimization. History of right vocal cord polyp or ulceration, nonmalignant, status post resection with chronic hoarse voice and sputum production. On symbicort and albuterol    Events since last clinic visit: Presents today for a follow up visit.   Mild cough in the AM with clear to green sputum, this is chronic since he cut back on smoking years ago.  Smoking about 2 cigs per day.  No sob/doe/fever/chills. Requested refill for symbicort today.  Will be starting speech therapy for vocal cord issues.   Current Outpatient Prescriptions:  .  albuterol (PROVENTIL HFA;VENTOLIN HFA) 108 (90 Base) MCG/ACT inhaler, Inhale 2 puffs into the lungs every 6 (six) hours as needed for wheezing or shortness of breath., Disp: 1 Inhaler, Rfl: 2 .  budesonide-formoterol (SYMBICORT) 80-4.5 MCG/ACT inhaler, Inhale 1 puff into the lungs 2 (two) times daily. (Patient taking differently: Inhale 2 puffs into the lungs 2 (two) times daily. ), Disp: 1 Inhaler, Rfl: 2 .  cyclobenzaprine (FLEXERIL) 10 MG tablet, Take 10 mg by mouth at bedtime., Disp: , Rfl:  .  HYDROcodone-acetaminophen (NORCO/VICODIN) 5-325 MG tablet, Take 1 tablet by mouth every 6 (six) hours as needed for moderate pain., Disp: 30 tablet, Rfl: 0 .  ipratropium-albuterol (DUONEB) 0.5-2.5 (3) MG/3ML SOLN, Take 3 mLs by nebulization 2 (two) times daily., Disp: , Rfl:  .  levETIRAcetam (KEPPRA) 500 MG tablet, Take 1 tablet (500 mg total) by mouth 2 (two) times daily., Disp: 60 tablet, Rfl: 1 .  Lurasidone HCl 60 MG TABS, Take 60  mg by mouth at bedtime., Disp: , Rfl:  .  omeprazole (PRILOSEC) 20 MG capsule, Take 20 mg by mouth 2 (two) times daily before a meal., Disp: , Rfl:  .  pantoprazole (PROTONIX) 40 MG tablet, Take 1 tablet (40 mg total) by mouth 2 (two) times daily before a meal., Disp: 60 tablet, Rfl: 3 .  ranitidine (ZANTAC) 300 MG tablet, Take 300 mg by mouth at bedtime., Disp: , Rfl:  .  venlafaxine XR (EFFEXOR-XR) 150 MG 24 hr capsule, Take 300 mg by mouth daily with breakfast., Disp: , Rfl:    Review of Systems  Constitutional: Negative for chills and fever.  Eyes: Negative for blurred vision.  Respiratory: Positive for cough and shortness of breath. Negative for sputum production.   Cardiovascular: Negative for chest pain.  Gastrointestinal: Negative for heartburn.  Genitourinary: Negative for dysuria.  Musculoskeletal: Negative for myalgias.  Skin: Negative for rash.  Neurological: Negative for dizziness and headaches.  Endo/Heme/Allergies: Does not bruise/bleed easily.  Psychiatric/Behavioral: Negative for depression.      Allergies:  Latex  Physical Examination:  VS: BP 128/72 (BP Location: Left Arm, Cuff Size: Normal)   Pulse 98   Ht 5\' 9"  (1.753 m)   Wt 203 lb 12.8 oz (92.4 kg)   SpO2 97%   BMI 30.10 kg/m   General Appearance: No distress  HEENT: PERRLA, no ptosis, no other lesions noticed Pulmonary:normal breath sounds., diaphragmatic excursion normal.No wheezing, No rales   Cardiovascular:  Normal S1,S2.  No m/r/g.  Abdomen:Exam: Benign, Soft, non-tender, No masses  Skin:   warm, no rashes, no ecchymosis  Extremities: normal, no cyanosis, clubbing, warm with normal capillary refill.      Assessment and Plan: COPD (chronic obstructive pulmonary disease) (HCC) COPD, Stage B Meds - Symbicort (80/4.5), 2 puff bid. Resuce inhaler MMRC-0 CAT-19  Still smoking, about 2 cigs per day.   Plan: -Continue with Symbicort 2 puffs twice a day Continue with rescue inhaler as  directed Cut back on smoking and eventually quit.  Cough Multifactorial: Vocal cord lesion, status post removal, COPD, prior tobacco abuse.  I suspect the majority of his chronic cough at this time over the last 3 months is due to vocal cord irritation causing bronchospasms and bronchial airway irritation. This could be triggering his COPD which is very mild symptomatically speaking.  COPD optimization with addition of Symbicort.  Plan: -Continue with speech therapy for vocal cord issues -Maintenance and rescue meds given to patient. Continue with Symbicort.  Cigarette smoker Tobacco Cessation - Counseling regarding benefits of smoking cessation strategies was provided for more than 3 min. - Educated that at this time smoking- cessation represents the single most important step that patient can take to enhance the length and quality of live. - Educated patient regarding alternatives of behavior interventions, pharmacotherapy including NRT and non-nicotine therapy such, and combinations of both. - Patient at this time will try to quit on his own, down to 2 cigs per day.    Updated Medication List Outpatient Encounter Prescriptions as of 05/02/2016  Medication Sig  . albuterol (PROVENTIL HFA;VENTOLIN HFA) 108 (90 Base) MCG/ACT inhaler Inhale 2 puffs into the lungs every 6 (six) hours as needed for wheezing or shortness of breath.  . budesonide-formoterol (SYMBICORT) 80-4.5 MCG/ACT inhaler Inhale 1 puff into the lungs 2 (two) times daily. (Patient taking differently: Inhale 2 puffs into the lungs 2 (two) times daily. )  . cyclobenzaprine (FLEXERIL) 10 MG tablet Take 10 mg by mouth at bedtime.  Marland Kitchen HYDROcodone-acetaminophen (NORCO/VICODIN) 5-325 MG tablet Take 1 tablet by mouth every 6 (six) hours as needed for moderate pain.  Marland Kitchen ipratropium-albuterol (DUONEB) 0.5-2.5 (3) MG/3ML SOLN Take 3 mLs by nebulization 2 (two) times daily.  Marland Kitchen levETIRAcetam (KEPPRA) 500 MG tablet Take 1 tablet (500 mg  total) by mouth 2 (two) times daily.  . Lurasidone HCl 60 MG TABS Take 60 mg by mouth at bedtime.  Marland Kitchen omeprazole (PRILOSEC) 20 MG capsule Take 20 mg by mouth 2 (two) times daily before a meal.  . pantoprazole (PROTONIX) 40 MG tablet Take 1 tablet (40 mg total) by mouth 2 (two) times daily before a meal.  . ranitidine (ZANTAC) 300 MG tablet Take 300 mg by mouth at bedtime.  Marland Kitchen venlafaxine XR (EFFEXOR-XR) 150 MG 24 hr capsule Take 300 mg by mouth daily with breakfast.  . [DISCONTINUED] budesonide-formoterol (SYMBICORT) 80-4.5 MCG/ACT inhaler Inhale 2 puffs into the lungs 2 (two) times daily.   No facility-administered encounter medications on file as of 05/02/2016.     Orders for this visit: No orders of the defined types were placed in this encounter.   Thank  you for the visitation and for allowing  [facility] to assist in the care of your patient. Our recommendations are noted above.  Please contact us if we can be of further service.  Vilinda Boehringer, MD [facility] Office Number: [phone]  Note: This note was prepared with Dragon dictation along with smaller  Company secretary. Any transcriptional errors that result from this process are unintentional.

## 2016-05-04 ENCOUNTER — Ambulatory Visit
Admission: RE | Admit: 2016-05-04 | Discharge: 2016-05-04 | Disposition: A | Discharge status: Home / Self Care | Payer: Medicare Other | Source: Ambulatory Visit | Attending: Neurosurgery | Admitting: Neurosurgery

## 2016-05-04 VITALS — BP 114/72 | HR 80

## 2016-05-04 DIAGNOSIS — M47812 Spondylosis without myelopathy or radiculopathy, cervical region: Secondary | ICD-10-CM | POA: Diagnosis not present

## 2016-05-04 DIAGNOSIS — M542 Cervicalgia: Secondary | ICD-10-CM

## 2016-05-04 DIAGNOSIS — M4722 Other spondylosis with radiculopathy, cervical region: Secondary | ICD-10-CM

## 2016-05-04 MED ORDER — DIAZEPAM 5 MG PO TABS
10.0000 mg | ORAL_TABLET | Freq: Once | ORAL | Status: AC
Start: 2016-05-04 — End: 2016-05-04
  Administered 2016-05-04: 10 mg via ORAL

## 2016-05-04 MED ORDER — IOPAMIDOL (ISOVUE-M 300) INJECTION 61%
1.0000 mL | Freq: Once | INTRAMUSCULAR | Status: AC | PRN
  Administered 2016-05-04: 1 mL via EPIDURAL

## 2016-05-04 MED ORDER — TRIAMCINOLONE ACETONIDE 40 MG/ML IJ SUSP (RADIOLOGY)
60.0000 mg | Freq: Once | INTRAMUSCULAR | Status: AC
  Administered 2016-05-04: 60 mg via EPIDURAL

## 2016-05-17 ENCOUNTER — Telehealth: Payer: Self-pay | Admitting: *Deleted

## 2016-05-17 NOTE — Telephone Encounter (Signed)
Pt requested a medication refill hydrocodone  Pt contact [phone]

## 2016-05-18 MED ORDER — HYDROCODONE-ACETAMINOPHEN 5-325 MG PO TABS: 1.0000 | ORAL_TABLET | Freq: Four times a day (QID) | ORAL | 0 refills | Status: DC | PRN

## 2016-05-18 NOTE — Telephone Encounter (Signed)
It looks like he is scheduled in January. Refill will be given.

## 2016-05-18 NOTE — Telephone Encounter (Signed)
LM for patient that RX is up front for him to pick up

## 2016-05-18 NOTE — Telephone Encounter (Signed)
Can we refill this? Does not look like he is scheduled for pain management yet.

## 2016-06-13 ENCOUNTER — Other Ambulatory Visit (INDEPENDENT_AMBULATORY_CARE_PROVIDER_SITE_OTHER): Payer: Medicare Other

## 2016-06-13 ENCOUNTER — Ambulatory Visit (INDEPENDENT_AMBULATORY_CARE_PROVIDER_SITE_OTHER): Payer: Medicare Other | Admitting: Neurology

## 2016-06-13 ENCOUNTER — Encounter: Payer: Self-pay | Admitting: Neurology

## 2016-06-13 VITALS — BP 118/72 | HR 62 | Ht 69.0 in | Wt 197.1 lb

## 2016-06-13 DIAGNOSIS — R404 Transient alteration of awareness: Secondary | ICD-10-CM

## 2016-06-13 DIAGNOSIS — R413 Other amnesia: Secondary | ICD-10-CM

## 2016-06-13 DIAGNOSIS — F4310 Post-traumatic stress disorder, unspecified: Secondary | ICD-10-CM

## 2016-06-13 LAB — VITAMIN B12: VITAMIN B 12: 383 pg/mL (ref 211–911)

## 2016-06-13 LAB — TSH: TSH: 0.66 u[IU]/mL (ref 0.35–4.50)

## 2016-06-13 NOTE — Progress Notes (Signed)
NEUROLOGY FOLLOW UP OFFICE NOTE  Patrick Gregory [id number]  HISTORY OF PRESENT ILLNESS: I had the pleasure of seeing Patrick Gregory in follow-up in the neurology clinic on 06/13/2016.  The patient was last seen 3 months ago and is again accompanied by his fiancee who helps supplement the history today.  Records and images were personally reviewed where available. His 1-hour awake and asleep EEG was abnormal due to occasional focal slowing over the left temporal region. No epileptiform discharges seen. He continues on Keppra 500mg  BID. Since his last visit, they report 3 seizures, on 9/21, 9/28, and possibly one 1.5 weeks ago. With the seizure on 9/28 his fiancee heard him fall and found that he fit his face. She also reports that his memory has gotten bad, he thinks it is night when the sun is out. They would be having a conversation, then within a second he is talking about something different and needs to be redirected. He gets the days of the week messed up. She feels symptoms worsened 2 months ago, and notes that he had a change in his psychiatric medications 2-3 months ago. He hit his left shoulder with a recent fall. He stumbles when he walks. He has chronic neck and right arm pain. He denies any focal numbness/tingling, headaches, dizziness, myoclonic jerks.   HPI 03/03/2016: This is a pleasant 55 year old right-handed man with a history of COPD, chronic neck and back pain, PTSD, who presented for seizures. He reports that the first seizure occurred 3-4 years ago, he woke up on the floor then 2-3 hours later passed out again. He denied any prior warning symptoms. He was started on an unrecalled seizure medication which he took for a year then self-discontinued because he had been seizure-free.  He was event-free until January 2017 when he had 3 episodes in a week. The first episode he was sitting on the couch when his head went back and he became unresponsive for a few seconds, eyes were closed. The next  day, he was working on something on the wall when his uncle noticed he looked lost. He called to him and he snapped back. The third episode occurred 2 days later, again while sitting on the couch, his head went back with eyes closed, then he had mild jerking of both arms for a few seconds. No tongue bite or incontinence. When he came to, he told his fiancee he had a headache and felt nauseated. He had another mild episode 3 weeks ago. His is amnestic of these episodes, his whole body feels weak after, no focal weakness. He and his fiancee deny any staring/unresponsive episodes, no gaps in time. He denies any  olfactory/gustatory hallucinations, deja vu, rising epigastric sensation, myoclonic jerks. When he turns his head to the right, he has numbness over the left arm. He reports having degenerative disc disease and sees neurosurgery. He was started on Keppra 500mg  and denies any side effects since taking it the past 3 days. He was in the ER last 02/25/16 for gait difficulty, he was staggering, walking like was drunk. He was evaluated by Neurology at [facility], no focal weakness seen, finger to nose testing appeared functional. Symptoms lasted 2-3 days. He had not started Keppra when these symptoms began. He had an MRI brain with and without contrast which I personally reviewed, there were no acute changes, hippocampi symmetric with no abnormal signal or enhancement seen. He had a head CT the next day with no acute changes. The gait  instability has since resolved.  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 He has had several head injuries with loss of consciousness while he was in [employer], otherwise he had a normal birth and early development.  There is no history of febrile convulsions, CNS infections such as meningitis/encephalitis, neurosurgical procedures, or family history of seizures.  PAST  MEDICAL HISTORY: Past Medical History:  Diagnosis Date  . Allergic rhinitis   . Arthritis    shoulders, back  . Asthma   . Bipolar disorder ([place])   . Chickenpox   . COPD (chronic obstructive pulmonary disease) ([place])   . Depression   . Low back pain   . Seizures ([place])    last one approx 18 mos ago.  No meds approx 1 yr.  . Sleep apnea    supposed to use CPAP. Does not have.  . Stiff neck    limited side to side turn.  Left side pain when looking up.  . Throat cancer [facility])     MEDICATIONS: Current Outpatient Prescriptions on File Prior to Visit  Medication Sig Dispense Refill  . albuterol (PROVENTIL HFA;VENTOLIN HFA) 108 (90 Base) MCG/ACT inhaler Inhale 2 puffs into the lungs every 6 (six) hours as needed for wheezing or shortness of breath. 3 Inhaler 2  . budesonide-formoterol (SYMBICORT) 80-4.5 MCG/ACT inhaler Inhale 2 puffs into the lungs 2 (two) times daily. 3 Inhaler 3  . cyclobenzaprine (FLEXERIL) 10 MG tablet Take 10 mg by mouth at bedtime.    Marland Kitchen HYDROcodone-acetaminophen (NORCO/VICODIN) 5-325 MG tablet Take 1 tablet by mouth every 6 (six) hours as needed for moderate pain. 60 tablet 0  . ipratropium-albuterol (DUONEB) 0.5-2.5 (3) MG/3ML SOLN Take 3 mLs by nebulization 2 (two) times daily.    Marland Kitchen levETIRAcetam (KEPPRA) 500 MG tablet Take 1 tablet (500 mg total) by mouth 2 (two) times daily. 60 tablet 1  . Lurasidone HCl 60 MG TABS Take 60 mg by mouth at bedtime.    Marland Kitchen omeprazole (PRILOSEC) 20 MG capsule Take 20 mg by mouth 2 (two) times daily before a meal.    . pantoprazole (PROTONIX) 40 MG tablet Take 1 tablet (40 mg total) by mouth 2 (two) times daily before a meal. 60 tablet 3  . ranitidine (ZANTAC) 300 MG tablet Take 300 mg by mouth at bedtime.    Marland Kitchen venlafaxine XR (EFFEXOR-XR) 150 MG 24 hr capsule Take 300 mg by mouth daily with breakfast.     No current facility-administered medications on file prior to visit.     ALLERGIES: Allergies  Allergen Reactions  . Latex  Itching    FAMILY HISTORY: Family History  Problem Relation Age of Onset  . Alcoholism    . Arthritis    . Hyperlipidemia    . Heart disease    . Sudden death    . Kidney disease    . Mental illness    . Depression Mother   . Bipolar disorder Mother   . Depression Brother   . Bipolar disorder Brother     SOCIAL HISTORY: Social History   Social History  . Marital status: Single    Spouse name: N/A  . Number of children: N/A  . Years of education: N/A   Occupational History  . retired     Nature conservation officer   Social History Main Topics  . Smoking status: Current Some Day  Smoker    Packs/day: 1.00    Years: 35.00    Types: Cigarettes    Last attempt to quit: 05/01/2014  . Smokeless tobacco: Never Used     Comment: smokes 1-2 cigarettes weekly.   . Alcohol use 0.0 oz/week     Comment: occ  . Drug use: No  . Sexual activity: Not on file   Other Topics Concern  . Not on file   Social History Narrative   Lives with fiancee. Lives in apartment on the 3rd floor. Sometimes feels weakness going up the stairs. Formerly lived in [place]. Has been here for 8 months.     REVIEW OF SYSTEMS: Constitutional: No fevers, chills, or sweats, no generalized fatigue, change in appetite Eyes: No visual changes, double vision, eye pain Ear, nose and throat: No hearing loss, ear pain, nasal congestion, sore throat Cardiovascular: No chest pain, palpitations Respiratory:  No shortness of breath at rest or with exertion, wheezes GastrointestinaI: No nausea, vomiting, diarrhea, abdominal pain, fecal incontinence Genitourinary:  No dysuria, urinary retention or frequency Musculoskeletal:  No neck pain, back pain Integumentary: No rash, pruritus, skin lesions Neurological: as above Psychiatric: + depression, insomnia, anxiety Endocrine: No palpitations, fatigue, diaphoresis, mood swings, change in appetite, change in weight, increased thirst Hematologic/Lymphatic:  No anemia, purpura,  petechiae. Allergic/Immunologic: no itchy/runny eyes, nasal congestion, recent allergic reactions, rashes  PHYSICAL EXAM: Vitals:   06/13/16 1435  BP: 118/72  Pulse: 62   General: No acute distress, appears drowsy today Head:  Normocephalic/atraumatic Neck: supple, no paraspinal tenderness, full range of motion Heart:  Regular rate and rhythm Lungs:  Clear to auscultation bilaterally Back: No paraspinal tenderness Skin/Extremities: No rash, no edema Neurological Exam: alert and oriented to person, place, and time. No aphasia or dysarthria. Fund of knowledge is appropriate.  Remote memory intact. 0/3 delayed recall.  Attention and concentration are normal.    Able to name objects and repeat phrases. Cranial nerves: Pupils equal, round, reactive to light. Extraocular movements intact with no nystagmus. Visual fields full. Facial sensation intact. No facial asymmetry. Tongue, uvula, palate midline.  Motor: Bulk and tone normal, muscle strength 5/5 throughout with no pronator drift.  Sensation to light touch intact.  No extinction to double simultaneous stimulation.  Deep tendon reflexes 2+ throughout, toes downgoing.  Finger to nose testing intact.  Gait narrow-based and steady, able to tandem walk adequately.  Romberg negative.  IMPRESSION: This is a pleasant 55 yo RH man with a history of COPD, chronic neck and back pain, PTSD, with a 3-4 year history of seizures. His EEG showed occasional left temporal slowing, no epileptiform discharges. MRI brain unremarkable. He is on Keppra 500mg  BID and reports 3 seizures in the past 3 months, with a fall with one of them. His fiancee again asks about psychogenic seizures, we will schedule him for a 48-hour EEG to further classify his seizures. They report memory issues, however these seem to have started after psychiatric medications were started. Check TSH and B12. EEG will help determine if he is having subclinical seizures potentially causing memory  issues. He was advised to continue working with Behavioral Medicine. [place] driving laws were discussed with the patient, and he knows to stop driving after a seizure, until 6 months seizure-free. He will follow-up in 2 months and knows to call for any changes.   Thank you for allowing me to participate in his care.  Please do not hesitate to call for any questions or concerns.  The  duration of this appointment visit was 25 minutes of face-to-face time with the patient.  Greater than 50% of this time was spent in counseling, explanation of diagnosis, planning of further management, and coordination of care.   Ellouise Newer, M.D.   CC: Dr. Caryl Bis

## 2016-06-13 NOTE — Patient Instructions (Signed)
1. Bloodwork for TSH, B12 2. Schedule 48-hour EEG 3. Continue all your medications 4. Follow-up in 2 months, call for any changes

## 2016-06-15 ENCOUNTER — Encounter: Payer: Self-pay | Admitting: Neurology

## 2016-06-17 ENCOUNTER — Telehealth: Payer: Self-pay

## 2016-06-17 NOTE — Telephone Encounter (Signed)
-----   Message from Cameron Sprang, MD sent at 06/17/2016  2:49 PM EST ----- Pls let him know thyroid and B12 normal, thanks

## 2016-06-17 NOTE — Telephone Encounter (Signed)
Patient notified

## 2016-06-20 ENCOUNTER — Ambulatory Visit (INDEPENDENT_AMBULATORY_CARE_PROVIDER_SITE_OTHER): Payer: Medicare Other | Admitting: Neurology

## 2016-06-20 DIAGNOSIS — R404 Transient alteration of awareness: Secondary | ICD-10-CM

## 2016-06-20 DIAGNOSIS — F4310 Post-traumatic stress disorder, unspecified: Secondary | ICD-10-CM

## 2016-06-20 DIAGNOSIS — R413 Other amnesia: Secondary | ICD-10-CM

## 2016-06-30 ENCOUNTER — Telehealth: Payer: Self-pay | Admitting: Family Medicine

## 2016-06-30 MED ORDER — HYDROCODONE-ACETAMINOPHEN 5-325 MG PO TABS
1.0000 | ORAL_TABLET | Freq: Four times a day (QID) | ORAL | 0 refills | Status: DC | PRN
Start: 2016-06-30 — End: 2016-07-21

## 2016-06-30 NOTE — Telephone Encounter (Signed)
Pt called requesting a refill on HYDROcodone-acetaminophen (NORCO/VICODIN) 5-325 MG tablet. He starts pain management in January and that was the earliest app they could give him. Please advise, thank you!  [place], [place]  Call pt @ [phone]

## 2016-06-30 NOTE — Telephone Encounter (Signed)
Please advise 

## 2016-06-30 NOTE — Telephone Encounter (Signed)
Refill given. Please place a front for pickup.

## 2016-07-01 NOTE — Telephone Encounter (Signed)
Notified patient that RX will be up front for him to pick up

## 2016-07-21 ENCOUNTER — Other Ambulatory Visit: Payer: Self-pay | Admitting: Family Medicine

## 2016-07-21 MED ORDER — HYDROCODONE-ACETAMINOPHEN 5-325 MG PO TABS: 1.0000 | ORAL_TABLET | Freq: Four times a day (QID) | ORAL | 0 refills | Status: DC | PRN

## 2016-07-21 NOTE — Telephone Encounter (Signed)
Patient notified

## 2016-07-21 NOTE — Telephone Encounter (Signed)
Last filled 06/30/16 60 0rf

## 2016-07-21 NOTE — Telephone Encounter (Signed)
Pt called and stated that he needs a refill on his HYDROcodone-acetaminophen (NORCO/VICODIN) 5-325 MG tablet, he is taking 3 a day. He has an appt with Pain Management in January. Please advise, thank you!  Call pt @ [phone]

## 2016-07-21 NOTE — Telephone Encounter (Signed)
Refill given. Patient will need to be seen in the office for further refills moving forward. Please inform him of this.

## 2016-07-26 NOTE — Procedures (Signed)
ELECTROENCEPHALOGRAM REPORT  Dates of Recording: 06/20/2016 1000 hours to 06/22/2016 0938 hours  Patient's Name: Patrick Gregory MRN: [id number] Date of Birth: 06-08-1961  Referring Provider: Dr. Ellouise Newer  Procedure: 48-hour ambulatory EEG  History: This is a 55 year old man with recurrent seizures on Keppra. EEG for classification.  Medications: Keppra, Albuterol, Symbicort, Flexeril,  Norco/Vicodin, Duoneb, Lurasidone HCl, Prilosec, Protonix, Zantac, Effexor XR  Technical Summary: This is a 48-hour multichannel digital EEG recording measured by the international 10-20 system with electrodes applied with paste and impedances below 5000 ohms performed as portable with EKG monitoring.  The digital EEG was referentially recorded, reformatted, and digitally filtered in a variety of bipolar and referential montages for optimal display.    DESCRIPTION OF RECORDING: During maximal wakefulness, the background activity consisted of a symmetric 10 Hz posterior dominant rhythm which was reactive to eye opening.  There were no epileptiform discharges or focal slowing seen in wakefulness.  During the recording, the patient progresses through wakefulness, drowsiness, and Stage 2 sleep. During drowsiness and sleep, there is occasional focal 4-5 Hz theta slowing over the left temporal region, at times sharply contoured without clear epileptogenic potential.  Again, there were no epileptiform discharges seen.  Events: There were no push button events. Patient did not complete diary.  There were no electrographic seizures seen.  EKG lead was unremarkable.  IMPRESSION: This 48-hour ambulatory EEG study is abnormal due to occasional focal slowing over the left temporal region.   CLINICAL CORRELATION of the above findings indicates focal cerebral dysfunction over the left temporal region suggestive of underlying structural or physiologic abnormality. Typical events were not captured. The absence of  epileptiform discharges does not exclude a clinical diagnosis of epilepsy.  If further clinical questions remain, inpatient video EEG monitoring may be helpful.   Ellouise Newer, M.D.

## 2016-08-08 ENCOUNTER — Telehealth: Payer: Self-pay | Admitting: Neurology

## 2016-08-08 ENCOUNTER — Other Ambulatory Visit: Payer: Self-pay | Admitting: *Deleted

## 2016-08-08 ENCOUNTER — Other Ambulatory Visit: Payer: Self-pay

## 2016-08-08 MED ORDER — LEVETIRACETAM 500 MG PO TABS: 500.0000 mg | ORAL_TABLET | Freq: Two times a day (BID) | ORAL | 5 refills | Status: DC

## 2016-08-08 MED ORDER — HYDROCODONE-ACETAMINOPHEN 5-325 MG PO TABS
1.0000 | ORAL_TABLET | Freq: Four times a day (QID) | ORAL | 0 refills | Status: DC | PRN
Start: 2016-08-08 — End: 2016-08-09

## 2016-08-08 NOTE — Telephone Encounter (Signed)
Patient needs a refill on his anit seizures medication please call patient at [phone] he could not tell me the name of the medication

## 2016-08-08 NOTE — Telephone Encounter (Signed)
Last filled 07/21/16 60 0rf

## 2016-08-08 NOTE — Telephone Encounter (Signed)
Contacted patient. He needs Keppra 500mg . Asks if this can be picked up so he can get filled at [facility]

## 2016-08-08 NOTE — Telephone Encounter (Signed)
Patient has requested a medication refill for hydrocodone  Pt has a pain management appt this month  Pt contact [phone]

## 2016-08-08 NOTE — Telephone Encounter (Signed)
Pt would like to be called once completed. Thank you!

## 2016-08-08 NOTE — Telephone Encounter (Signed)
Patients fiance answered, she is not on hippa form, she states she will have him call back

## 2016-08-08 NOTE — Telephone Encounter (Signed)
It appears the patient was advised to his last refill he would need to be seen in the office for further refills. Please get him scheduled for an appointment sometime this week and then I will refill enough for him until he can be seen. Thanks.

## 2016-08-09 ENCOUNTER — Encounter: Payer: Self-pay | Admitting: Family Medicine

## 2016-08-09 ENCOUNTER — Ambulatory Visit (INDEPENDENT_AMBULATORY_CARE_PROVIDER_SITE_OTHER): Payer: Medicare Other | Admitting: Family Medicine

## 2016-08-09 ENCOUNTER — Other Ambulatory Visit: Payer: Self-pay | Admitting: Family Medicine

## 2016-08-09 DIAGNOSIS — M542 Cervicalgia: Secondary | ICD-10-CM | POA: Diagnosis not present

## 2016-08-09 DIAGNOSIS — J069 Acute upper respiratory infection, unspecified: Secondary | ICD-10-CM | POA: Insufficient documentation

## 2016-08-09 DIAGNOSIS — B9789 Other viral agents as the cause of diseases classified elsewhere: Secondary | ICD-10-CM

## 2016-08-09 HISTORY — DX: Acute upper respiratory infection, unspecified: J06.9

## 2016-08-09 MED ORDER — HYDROCODONE-ACETAMINOPHEN 5-325 MG PO TABS: 1.0000 | ORAL_TABLET | Freq: Four times a day (QID) | ORAL | 0 refills | Status: DC | PRN

## 2016-08-09 NOTE — Telephone Encounter (Signed)
Patient is scheduled for today.  

## 2016-08-09 NOTE — Assessment & Plan Note (Signed)
This is a chronic issue. Currently on Vicodin for this with some benefit. He's been referred to pain management and he will see them next week. I'm hopeful that they can offer other interventions in addition to pain medication management as the patient has been dealing with this for some time. Did discuss that he would likely benefit from surgical intervention given his prior MRI results and paresthesias in his bilateral arms. He wants to hold off on this at this time. Refill of Vicodin given today. Discussed that he needs to keep his appointment with pain management. Given return precautions.

## 2016-08-09 NOTE — Progress Notes (Signed)
Pre visit review using our clinic review tool, if applicable. No additional management support is needed unless otherwise documented below in the visit note. 

## 2016-08-09 NOTE — Patient Instructions (Signed)
Nice to see you. We will refill your Vicodin. Please keep your pain management appointment next week. If you do have worsening upper respiratory symptoms please let us know. If you develop numbness, weakness, loss of bowel or bladder function, numbness between her legs, or any new or changing symptoms please seek medical attention medially.

## 2016-08-09 NOTE — Progress Notes (Signed)
[name], MD Phone: [phone]  Patrick Gregory is a 56 y.o. male who presents today for same-day visit.  Patient seen in follow-up for chronic pain. Patient had a number of different injuries when he is in the [employer]. Notes his worst pain is in his neck. Has been chronic. Was advised previously that he needed surgery on his neck though he is wanting to push this off as far as possible. Also gets pain in his low back if he is not standing straight up. He takes Vicodin 3-4 times a day. Reports he was on a higher dose previously. He notes if he turns his neck he gets pain shooting down his right arm if he turns it to the right and he gets pain shooting down his left arm if he turns it to the left. He notes that Vicodin doesn't make him drowsy. He does not drive. He notes no weakness. No loss of bowel or bladder function, saddle anesthesia, or fevers.  Patient had an upper respiratory infection about a week and half ago. Noted nasal mucus and rhinorrhea with some cough. Notes these symptoms are improving. No sinus pressure. No fevers. No postnasal drip.  PMH: Smoker   ROS see history of present illness  Objective  Physical Exam Vitals:   08/09/16 1330  BP: 110/70  Pulse: 90  Temp: 98.2 F (36.8 C)    BP Readings from Last 3 Encounters:  08/09/16 110/70  06/13/16 118/72  05/04/16 114/72   Wt Readings from Last 3 Encounters:  08/09/16 194 lb 3.2 oz (88.1 kg)  06/13/16 197 lb 1 oz (89.4 kg)  05/02/16 203 lb 12.8 oz (92.4 kg)    Physical Exam  Constitutional: No distress.  HENT:  Head: Normocephalic and atraumatic.  Mouth/Throat: Oropharynx is clear and moist. No oropharyngeal exudate.  Normal TMs  Eyes: Conjunctivae are normal. Pupils are equal, round, and reactive to light.  Cardiovascular: Normal rate, regular rhythm and normal heart sounds.   Pulmonary/Chest: Effort normal and breath sounds normal.  Musculoskeletal: He exhibits no edema.  No midline spine  tenderness, no midline spine step-off, he has diffuse muscular back tenderness in his upper back and neck   Neurological: He is alert. Gait normal.  5/5 strength in bilateral biceps, triceps, grip, quads, hamstrings, plantar and dorsiflexion, sensation to light touch intact in bilateral UE and LE, normal gait, 2+ patellar reflexes  Skin: Skin is warm and dry. He is not diaphoretic.     Assessment/Plan: Please see individual problem list.  Neck pain This is a chronic issue. Currently on Vicodin for this with some benefit. He's been referred to pain management and he will see them next week. I'm hopeful that they can offer other interventions in addition to pain medication management as the patient has been dealing with this for some time. Did discuss that he would likely benefit from surgical intervention given his prior MRI results and paresthesias in his bilateral arms. He wants to hold off on this at this time. Refill of Vicodin given today. Discussed that he needs to keep his appointment with pain management. Given return precautions.  Viral URI Patient's symptoms likely related to a virus. He is improving. He'll continue to monitor. Given return precautions.   No orders of the defined types were placed in this encounter.   Meds ordered this encounter  Medications  . HYDROcodone-acetaminophen (NORCO/VICODIN) 5-325 MG tablet    Sig: Take 1 tablet by mouth every 6 (six) hours as needed for moderate pain.  Dispense:  60 tablet    Refill:  0   [name], MD [place]

## 2016-08-09 NOTE — Assessment & Plan Note (Signed)
Patient's symptoms likely related to a virus. He is improving. He'll continue to monitor. Given return precautions.

## 2016-08-10 ENCOUNTER — Ambulatory Visit: Payer: Medicare Other | Admitting: Neurology

## 2016-08-10 ENCOUNTER — Ambulatory Visit: Payer: Medicare Other | Admitting: Family Medicine

## 2016-08-18 ENCOUNTER — Ambulatory Visit: Payer: Medicare Other | Admitting: Pain Medicine

## 2016-08-22 ENCOUNTER — Other Ambulatory Visit: Payer: Self-pay | Admitting: Family Medicine

## 2016-08-22 MED ORDER — HYDROCODONE-ACETAMINOPHEN 5-325 MG PO TABS: 1.0000 | ORAL_TABLET | Freq: Four times a day (QID) | ORAL | 0 refills | Status: DC | PRN

## 2016-08-22 NOTE — Telephone Encounter (Signed)
Last filled 08/09/16 60 0rf

## 2016-08-22 NOTE — Telephone Encounter (Signed)
Please place refill at front. Drug database reviewed and appropriate.

## 2016-08-22 NOTE — Telephone Encounter (Signed)
Pt called requesting a refill on HYDROcodone-acetaminophen (NORCO/VICODIN) 5-325 MG tablet. His pain mgmnt appt got rescheduled due to snow. Please advise, thank you!  Call pt @ [phone]  Pharmacy [facility] [address], [place]

## 2016-08-22 NOTE — Telephone Encounter (Signed)
Patient notified

## 2016-08-25 ENCOUNTER — Ambulatory Visit (INDEPENDENT_AMBULATORY_CARE_PROVIDER_SITE_OTHER): Payer: Medicare Other

## 2016-08-25 ENCOUNTER — Ambulatory Visit (INDEPENDENT_AMBULATORY_CARE_PROVIDER_SITE_OTHER): Payer: Medicare Other | Admitting: Family

## 2016-08-25 ENCOUNTER — Encounter: Payer: Self-pay | Admitting: Family

## 2016-08-25 VITALS — BP 120/80 | HR 94 | Temp 98.0°F | Ht 69.0 in | Wt 200.0 lb

## 2016-08-25 DIAGNOSIS — J209 Acute bronchitis, unspecified: Secondary | ICD-10-CM | POA: Diagnosis not present

## 2016-08-25 DIAGNOSIS — J029 Acute pharyngitis, unspecified: Secondary | ICD-10-CM | POA: Diagnosis not present

## 2016-08-25 DIAGNOSIS — J449 Chronic obstructive pulmonary disease, unspecified: Secondary | ICD-10-CM | POA: Diagnosis not present

## 2016-08-25 LAB — POCT RAPID STREP A (OFFICE): RAPID STREP A SCREEN: NEGATIVE

## 2016-08-25 MED ORDER — BENZONATATE 100 MG PO CAPS: 100.0000 mg | ORAL_CAPSULE | Freq: Three times a day (TID) | ORAL | 0 refills | Status: DC | PRN

## 2016-08-25 MED ORDER — AZITHROMYCIN 250 MG PO TABS: ORAL_TABLET | ORAL | 0 refills | Status: DC

## 2016-08-25 NOTE — Progress Notes (Signed)
Pre visit review using our clinic review tool, if applicable. No additional management support is needed unless otherwise documented below in the visit note. 

## 2016-08-25 NOTE — Progress Notes (Signed)
Subjective:    Patient ID: Patrick Gregory, male    DOB: 1960/11/29, 56 y.o.   MRN: [id number]  CC: Patrick Gregory is a 56 y.o. male who presents today for an acute visit.    HPI: Chief complaint productive cough, congestion 2 days ago, worsening. Wife has strep. Sinus pressure, wheezing. No fever, sob, or ear pain. Has been using inhaler with relief   History of COPD, quit smoking; follows with [facility]    HISTORY:  Past Medical History:  Diagnosis Date  . Allergic rhinitis   . Arthritis    shoulders, back  . Asthma   . Bipolar disorder ([place])   . Chickenpox   . COPD (chronic obstructive pulmonary disease) ([place])   . Depression   . Low back pain   . Seizures ([place])    last one approx 18 mos ago.  No meds approx 1 yr.  . Sleep apnea    supposed to use CPAP. Does not have.  . Stiff neck    limited side to side turn.  Left side pain when looking up.  . Throat cancer [facility])    Past Surgical History:  Procedure Laterality Date  . DIRECT LARYNGOSCOPY Right 10/23/2015   Procedure: MICO DIRECT LARYNGOSCOPY WITH EXCISION RIGHT VOCAL CORD LESION;  Surgeon: Beverly Gust, MD;  Location: [facility];  Service: ENT;  Laterality: Right;  . THROAT SURGERY     Family History  Problem Relation Age of Onset  . Alcoholism    . Arthritis    . Hyperlipidemia    . Heart disease    . Sudden death    . Kidney disease    . Mental illness    . Depression Mother   . Bipolar disorder Mother   . Depression Brother   . Bipolar disorder Brother     Allergies: Latex Current Outpatient Prescriptions on File Prior to Visit  Medication Sig Dispense Refill  . albuterol (PROVENTIL HFA;VENTOLIN HFA) 108 (90 Base) MCG/ACT inhaler Inhale 2 puffs into the lungs every 6 (six) hours as needed for wheezing or shortness of breath. 3 Inhaler 2  . benztropine (COGENTIN) 1 MG tablet Take 1 mg by mouth 2 (two) times daily.    . budesonide-formoterol (SYMBICORT) 80-4.5 MCG/ACT inhaler Inhale 2 puffs into the  lungs 2 (two) times daily. 3 Inhaler 3  . clonazePAM (KLONOPIN) 1 MG tablet Take 1 mg by mouth 2 (two) times daily.    . cyclobenzaprine (FLEXERIL) 10 MG tablet Take 10 mg by mouth at bedtime.    Marland Kitchen HYDROcodone-acetaminophen (NORCO/VICODIN) 5-325 MG tablet Take 1 tablet by mouth every 6 (six) hours as needed for moderate pain. 60 tablet 0  . ipratropium-albuterol (DUONEB) 0.5-2.5 (3) MG/3ML SOLN Take 3 mLs by nebulization 2 (two) times daily.    Marland Kitchen levETIRAcetam (KEPPRA) 500 MG tablet Take 1 tablet (500 mg total) by mouth 2 (two) times daily. 60 tablet 5  . Lurasidone HCl 60 MG TABS Take 60 mg by mouth at bedtime.    Marland Kitchen omeprazole (PRILOSEC) 20 MG capsule Take 20 mg by mouth 2 (two) times daily before a meal.    . pantoprazole (PROTONIX) 40 MG tablet Take 1 tablet (40 mg total) by mouth 2 (two) times daily before a meal. 60 tablet 3  . pregabalin (LYRICA) 75 MG capsule Take 75 mg by mouth 2 (two) times daily.    . ranitidine (ZANTAC) 300 MG tablet Take 300 mg by mouth at bedtime.    . sildenafil (  VIAGRA) 100 MG tablet Take 100 mg by mouth daily as needed for erectile dysfunction.    Marland Kitchen venlafaxine XR (EFFEXOR-XR) 150 MG 24 hr capsule Take 300 mg by mouth daily with breakfast.     No current facility-administered medications on file prior to visit.     Social History  Substance Use Topics  . Smoking status: Former Smoker    Packs/day: 1.00    Years: 35.00    Types: Cigarettes    Quit date: 05/01/2014  . Smokeless tobacco: Never Used     Comment: smokes 1-2 cigarettes weekly.   . Alcohol use 0.0 oz/week     Comment: occ    Review of Systems  Constitutional: Negative for chills and fever.  HENT: Positive for congestion, sinus pressure and sore throat.   Respiratory: Positive for cough and wheezing. Negative for shortness of breath.   Cardiovascular: Negative for chest pain and palpitations.  Gastrointestinal: Negative for nausea and vomiting.      Objective:    BP 120/80   Pulse 94    Temp 98 F (36.7 C) (Oral)   Ht 5\' 9"  (1.753 m)   Wt 200 lb (90.7 kg)   SpO2 96%   BMI 29.53 kg/m    Physical Exam  Constitutional: Vital signs are normal. He appears well-developed and well-nourished.  HENT:  Head: Normocephalic and atraumatic.  Right Ear: Hearing, tympanic membrane, external ear and ear canal normal. No drainage, swelling or tenderness. Tympanic membrane is not injected, not erythematous and not bulging. No middle ear effusion. No decreased hearing is noted.  Left Ear: Hearing, tympanic membrane, external ear and ear canal normal. No drainage, swelling or tenderness. Tympanic membrane is not injected, not erythematous and not bulging.  No middle ear effusion. No decreased hearing is noted.  Nose: Nose normal. Right sinus exhibits no maxillary sinus tenderness and no frontal sinus tenderness. Left sinus exhibits no maxillary sinus tenderness and no frontal sinus tenderness.  Mouth/Throat: Uvula is midline, oropharynx is clear and moist and mucous membranes are normal. No oropharyngeal exudate, posterior oropharyngeal edema, posterior oropharyngeal erythema or tonsillar abscesses.  Eyes: Conjunctivae are normal.  Cardiovascular: Regular rhythm and normal heart sounds.   Pulmonary/Chest: Effort normal. No respiratory distress. He has wheezes in the right lower field and the left lower field. He has no rhonchi. He has rales in the right lower field and the left lower field.  Lymphadenopathy:       Head (right side): No submental, no submandibular, no tonsillar, no preauricular, no posterior auricular and no occipital adenopathy present.       Head (left side): No submental, no submandibular, no tonsillar, no preauricular, no posterior auricular and no occipital adenopathy present.    He has no cervical adenopathy.  Neurological: He is alert.  Skin: Skin is warm and dry.  Psychiatric: He has a normal mood and affect. His speech is normal and behavior is normal.  Vitals  reviewed.      Assessment & Plan:   1. Sore throat Neg strep - POCT rapid strep A  2. Acute bronchitis, unspecified organism Afebrile. Sao2 96. Offered patient a nebulizer treatment in our office and he politely declined. He states he will do this when he gets home. Pending CXR to evaluate for PNA.   - azithromycin (ZITHROMAX) 250 MG tablet; Tale 500 mg PO on day 1, then 250 mg PO q24h x 4 days.  Dispense: 6 tablet; Refill: 0 - DG Chest 2 View  I am having Patrick Gregory start on azithromycin. I am also having him maintain his pantoprazole, venlafaxine XR, Lurasidone HCl, cyclobenzaprine, omeprazole, ranitidine, ipratropium-albuterol, budesonide-formoterol, albuterol, pregabalin, clonazePAM, benztropine, sildenafil, levETIRAcetam, and HYDROcodone-acetaminophen.   Meds ordered this encounter  Medications  . azithromycin (ZITHROMAX) 250 MG tablet    Sig: Tale 500 mg PO on day 1, then 250 mg PO q24h x 4 days.    Dispense:  6 tablet    Refill:  0    Order Specific Question:   Supervising Provider    Answer:   Crecencio Mc [[id number]]    Return precautions given.   Risks, benefits, and alternatives of the medications and treatment plan prescribed today were discussed, and patient expressed understanding.   Education regarding symptom management and diagnosis given to patient on AVS.  Continue to follow with [name], MD for routine health maintenance.   Dallas Breeding and I agreed with plan.   Mable Paris, FNP

## 2016-08-25 NOTE — Patient Instructions (Signed)
CXR  Nebulizer when you get home  Increase intake of clear fluids. Congestion is best treated by hydration, when mucus is wetter, it is thinner, less sticky, and easier to expel from the body, either through coughing up drainage, or by blowing your nose.   Get plenty of rest.   Use saline nasal drops and blow your nose frequently. Run a humidifier at night and elevate the head of the bed. Vicks Vapor rub will help with congestion and cough. Steam showers and sinus massage for congestion.   Use Acetaminophen or Ibuprofen as needed for fever or pain. Avoid second hand smoke. Even the smallest exposure will worsen symptoms.   Over the counter medications you can try include Delsym for cough, a decongestant for congestion, and Mucinex or Robitussin as an expectorant. Be sure to just get the plain Mucinex or Robitussin that just has one medication (Guaifenesen). We don't recommend the combination products. Note, be sure to drink two glasses of water with each dose of Mucinex as the medication will not work well without adequate hydration.   You can also try a teaspoon of honey to see if this will help reduce cough. Throat lozenges can sometimes be beneficial as well.    This illness will typically last 7 - 10 days.   Please follow up with our clinic if you develop a fever greater than 101 F, symptoms worsen, or do not resolve in the next week.

## 2016-08-29 ENCOUNTER — Ambulatory Visit: Payer: Medicare Other | Attending: Pain Medicine | Admitting: Pain Medicine

## 2016-08-29 ENCOUNTER — Encounter: Payer: Self-pay | Admitting: Pain Medicine

## 2016-08-29 VITALS — BP 99/45 | HR 106 | Temp 98.6°F | Resp 16 | Ht 69.0 in | Wt 195.0 lb

## 2016-08-29 DIAGNOSIS — G8929 Other chronic pain: Secondary | ICD-10-CM | POA: Insufficient documentation

## 2016-08-29 DIAGNOSIS — M1711 Unilateral primary osteoarthritis, right knee: Secondary | ICD-10-CM

## 2016-08-29 DIAGNOSIS — J449 Chronic obstructive pulmonary disease, unspecified: Secondary | ICD-10-CM | POA: Insufficient documentation

## 2016-08-29 DIAGNOSIS — M546 Pain in thoracic spine: Secondary | ICD-10-CM | POA: Insufficient documentation

## 2016-08-29 DIAGNOSIS — Z7951 Long term (current) use of inhaled steroids: Secondary | ICD-10-CM | POA: Insufficient documentation

## 2016-08-29 DIAGNOSIS — Z87891 Personal history of nicotine dependence: Secondary | ICD-10-CM | POA: Diagnosis not present

## 2016-08-29 DIAGNOSIS — M75101 Unspecified rotator cuff tear or rupture of right shoulder, not specified as traumatic: Secondary | ICD-10-CM | POA: Insufficient documentation

## 2016-08-29 DIAGNOSIS — R569 Unspecified convulsions: Secondary | ICD-10-CM | POA: Insufficient documentation

## 2016-08-29 DIAGNOSIS — G4733 Obstructive sleep apnea (adult) (pediatric): Secondary | ICD-10-CM

## 2016-08-29 DIAGNOSIS — M9981 Other biomechanical lesions of cervical region: Secondary | ICD-10-CM

## 2016-08-29 DIAGNOSIS — M25561 Pain in right knee: Secondary | ICD-10-CM

## 2016-08-29 DIAGNOSIS — F319 Bipolar disorder, unspecified: Secondary | ICD-10-CM

## 2016-08-29 DIAGNOSIS — Z79899 Other long term (current) drug therapy: Secondary | ICD-10-CM | POA: Insufficient documentation

## 2016-08-29 DIAGNOSIS — G4730 Sleep apnea, unspecified: Secondary | ICD-10-CM

## 2016-08-29 DIAGNOSIS — M542 Cervicalgia: Secondary | ICD-10-CM | POA: Diagnosis present

## 2016-08-29 DIAGNOSIS — M4802 Spinal stenosis, cervical region: Secondary | ICD-10-CM | POA: Diagnosis not present

## 2016-08-29 DIAGNOSIS — M797 Fibromyalgia: Secondary | ICD-10-CM | POA: Diagnosis not present

## 2016-08-29 DIAGNOSIS — M25511 Pain in right shoulder: Secondary | ICD-10-CM

## 2016-08-29 DIAGNOSIS — Z8249 Family history of ischemic heart disease and other diseases of the circulatory system: Secondary | ICD-10-CM | POA: Diagnosis not present

## 2016-08-29 DIAGNOSIS — Z818 Family history of other mental and behavioral disorders: Secondary | ICD-10-CM | POA: Insufficient documentation

## 2016-08-29 DIAGNOSIS — Z79891 Long term (current) use of opiate analgesic: Secondary | ICD-10-CM | POA: Insufficient documentation

## 2016-08-29 DIAGNOSIS — F4310 Post-traumatic stress disorder, unspecified: Secondary | ICD-10-CM | POA: Diagnosis not present

## 2016-08-29 DIAGNOSIS — Z91040 Latex allergy status: Secondary | ICD-10-CM | POA: Insufficient documentation

## 2016-08-29 DIAGNOSIS — M79601 Pain in right arm: Secondary | ICD-10-CM

## 2016-08-29 DIAGNOSIS — K219 Gastro-esophageal reflux disease without esophagitis: Secondary | ICD-10-CM | POA: Diagnosis not present

## 2016-08-29 DIAGNOSIS — G894 Chronic pain syndrome: Secondary | ICD-10-CM | POA: Diagnosis not present

## 2016-08-29 DIAGNOSIS — Z85819 Personal history of malignant neoplasm of unspecified site of lip, oral cavity, and pharynx: Secondary | ICD-10-CM | POA: Diagnosis not present

## 2016-08-29 DIAGNOSIS — M25512 Pain in left shoulder: Secondary | ICD-10-CM

## 2016-08-29 DIAGNOSIS — M545 Low back pain: Secondary | ICD-10-CM | POA: Diagnosis not present

## 2016-08-29 DIAGNOSIS — M549 Dorsalgia, unspecified: Secondary | ICD-10-CM

## 2016-08-29 DIAGNOSIS — M5412 Radiculopathy, cervical region: Secondary | ICD-10-CM

## 2016-08-29 DIAGNOSIS — F1190 Opioid use, unspecified, uncomplicated: Secondary | ICD-10-CM

## 2016-08-29 HISTORY — DX: Other chronic pain: G89.29

## 2016-08-29 HISTORY — DX: Chronic pain syndrome: G89.4

## 2016-08-29 HISTORY — DX: Pain in right knee: M25.561

## 2016-08-29 HISTORY — DX: Obstructive sleep apnea (adult) (pediatric): G47.33

## 2016-08-29 HISTORY — DX: Unilateral primary osteoarthritis, right knee: M17.11

## 2016-08-29 HISTORY — DX: Spinal stenosis, cervical region: M48.02

## 2016-08-29 HISTORY — DX: Unspecified rotator cuff tear or rupture of right shoulder, not specified as traumatic: M75.101

## 2016-08-29 HISTORY — DX: Pain in right shoulder: M25.511

## 2016-08-29 NOTE — Progress Notes (Signed)
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   Thoracic Spine Exam  Inspection: No masses, redness, or swelling Alignment: Symmetrical Functional ROM: Unrestricted ROM Stability: No instability detected Sensory: Unimpaired Muscle strength & Tone: Functionally intact Palpation: Non-contributory  Lumbar Spine Exam  Inspection: No masses, redness, or swelling Alignment: Symmetrical Functional ROM: Unrestricted ROM Stability: No instability detected Muscle strength & Tone: Functionally intact Sensory: Unimpaired Palpation: Non-contributory Provocative Tests: Lumbar Hyperextension and rotation test: evaluation deferred today       Patrick's Maneuver: evaluation deferred today              Gait & Posture Assessment  Ambulation: Unassisted Gait: Relatively normal for age and body habitus Posture: WNL   Lower Extremity Exam    Side: Right lower extremity  Side: Left lower extremity  Inspection: No masses, redness, swelling, or asymmetry  Inspection: No masses, redness, swelling, or asymmetry  Functional ROM: Unrestricted ROM          Functional ROM: Unrestricted ROM          Muscle strength & Tone: Functionally  intact  Muscle strength & Tone: Functionally intact  Sensory: Unimpaired  Sensory: Unimpaired  Palpation: Non-contributory  Palpation: Non-contributory   Assessment  Primary Diagnosis & Pertinent Problem List: The primary encounter diagnosis was Chronic pain syndrome. Diagnoses of Bipolar affective disorder, remission status unspecified ([place]), Sleep apnea, unspecified type, Long term current use of opiate analgesic, Long term prescription opiate use, Opiate use, Chronic upper back pain (Location of Secondary source of pain) (Bilateral) (L>R), Chronic neck pain (Location of Primary Source of Pain) (Bilateral) (R>L), Chronic low back pain (Location of Tertiary source of pain) (Bilateral) (R>L), Chronic shoulder pain (Bilateral) (R>L), Rotator cuff syndrome (Right), Chronic upper extremity pain (Right), Cervical foraminal stenosis (Right), Chronic cervical radicular pain (Right) (intermittent), Chronic knee pain (Right), and Osteoarthritis of knee (Right) were also pertinent to this visit.  Visit Diagnosis: 1. Chronic  pain syndrome   2. Bipolar affective disorder, remission status unspecified ([place])   3. Sleep apnea, unspecified type   4. Long term current use of opiate analgesic   5. Long term prescription opiate use   6. Opiate use   7. Chronic upper back pain (Location of Secondary source of pain) (Bilateral) (L>R)   8. Chronic neck pain (Location of Primary Source of Pain) (Bilateral) (R>L)   9. Chronic low back pain (Location of Tertiary source of pain) (Bilateral) (R>L)   10. Chronic shoulder pain (Bilateral) (R>L)   11. Rotator cuff syndrome (Right)   12. Chronic upper extremity pain (Right)   13. Cervical foraminal stenosis (Right)   14. Chronic cervical radicular pain (Right) (intermittent)   15. Chronic knee pain (Right)   16. Osteoarthritis of knee (Right)    Plan of Care  Initial treatment plan:  Please be advised that as per protocol, today's visit has been an evaluation only. We  have not taken over the patient's controlled substance management.  Problem-specific plan: No problem-specific Assessment & Plan notes found for this encounter.  Ordered Lab-work, Procedure(s), Referral(s), & Consult(s): Orders Placed This Encounter  Procedures  . DG Cervical Spine Complete  . MR CERVICAL SPINE WO CONTRAST  . DG Knee 1-2 Views Right  . DG Thoracic Spine 2 View  . DG Lumbar Spine Complete W/Bend  . DG Shoulder Left  . DG Shoulder Right  . Compliance Drug Analysis, Ur  . Comprehensive metabolic panel  . C-reactive protein  . Magnesium  . Sedimentation rate  . 25-Hydroxyvitamin D Lcms D2+D3  . Ambulatory referral to Psychology   Pharmacotherapy: Medications ordered:  No orders of the defined types were placed in this encounter.  Medications administered during this visit: Patrick Gregory had no medications administered during this visit.   Pharmacotherapy under consideration:  Opioid Analgesics: The patient was informed that there is no guarantee that he would be a candidate for opioid analgesics. The decision will be made following CDC guidelines. This decision will be based on the results of diagnostic studies, as well as Patrick Gregory's risk profile.  Membrane stabilizer: To be determined at a later time Muscle relaxant: To be determined at a later time NSAID: To be determined at a later time Other analgesic(s): To be determined at a later time   Interventional therapies under consideration: Patrick Gregory was informed that there is no guarantee that he would be a candidate for interventional therapies. The decision will be based on the results of diagnostic studies, as well as Patrick Gregory's risk profile.  Possible procedure(s): Diagnostic right-sided cervical epidural steroid injection under fluoroscopic guidance  Diagnostic bilateral cervical facet block under fluoroscopic guidance  Possible bilateral cervical facet radiofrequency ablation  Bilateral intra-articular shoulder  joint injection under fluoroscopic guidance  Diagnostic bilateral suprascapular nerve block under fluoroscopic guidance  Possible bilateral suprascapular nerve radiofrequency ablation  Diagnostic bilateral lumbar facet block under fluoroscopic guidance  Possible bilateral lumbar facet radiofrequency ablation  Diagnostic right-sided intra-articular knee joint injection with local anesthetic and steroid  Diagnostic right-sided genicular nerve blocks under fluoroscopic guidance  Possible right-sided genicular nerve radiofrequency ablation    Provider-requested follow-up: Return for 2nd Visit, after [facility] evaluation.  Future Appointments Date Time Provider [place]  12/02/2016 8:30 AM Cameron Sprang, MD [facility] None    Primary Care Physician: Leone Haven, MD Location: [facility] Note by: Kathlen Brunswick. Dossie Arbour, M.D, DABA, DABAPM, DABPM, DABIPP, FIPP Date: 08/29/2016; Time: 3:12  PM  Pain Score Disclaimer: We use the NRS-11 scale. This is a self-reported, subjective measurement of pain severity with only modest accuracy. It is used primarily to identify changes within a particular patient. It must be understood that outpatient pain scales are significantly less accurate that those used for research, where they can be applied under ideal controlled circumstances with minimal exposure to variables. In reality, the score is likely to be a combination of pain intensity and pain affect, where pain affect describes the degree of emotional arousal or changes in action readiness caused by the sensory experience of pain. Factors such as social and work situation, setting, emotional state, anxiety levels, expectation, and prior pain experience may influence pain perception and show large inter-individual differences that may also be affected by time variables.  Patient instructions provided during this appointment: There are no Patient Instructions on file for  this visit.

## 2016-08-30 ENCOUNTER — Ambulatory Visit
Admission: RE | Admit: 2016-08-30 | Discharge: 2016-08-30 | Disposition: A | Discharge status: Home / Self Care | Payer: Medicare Other | Source: Ambulatory Visit | Attending: Pain Medicine | Admitting: Pain Medicine

## 2016-08-30 ENCOUNTER — Other Ambulatory Visit
Admission: RE | Admit: 2016-08-30 | Discharge: 2016-08-30 | Disposition: A | Discharge status: Home / Self Care | Payer: Medicare Other | Source: Ambulatory Visit | Attending: Pain Medicine | Admitting: Pain Medicine

## 2016-08-30 DIAGNOSIS — G894 Chronic pain syndrome: Secondary | ICD-10-CM | POA: Insufficient documentation

## 2016-08-30 DIAGNOSIS — M546 Pain in thoracic spine: Secondary | ICD-10-CM | POA: Diagnosis not present

## 2016-08-30 DIAGNOSIS — M25511 Pain in right shoulder: Secondary | ICD-10-CM | POA: Diagnosis not present

## 2016-08-30 DIAGNOSIS — M4316 Spondylolisthesis, lumbar region: Secondary | ICD-10-CM | POA: Diagnosis not present

## 2016-08-30 DIAGNOSIS — M75101 Unspecified rotator cuff tear or rupture of right shoulder, not specified as traumatic: Secondary | ICD-10-CM

## 2016-08-30 DIAGNOSIS — M47816 Spondylosis without myelopathy or radiculopathy, lumbar region: Secondary | ICD-10-CM | POA: Diagnosis not present

## 2016-08-30 DIAGNOSIS — M1711 Unilateral primary osteoarthritis, right knee: Secondary | ICD-10-CM | POA: Diagnosis not present

## 2016-08-30 DIAGNOSIS — M4802 Spinal stenosis, cervical region: Secondary | ICD-10-CM

## 2016-08-30 DIAGNOSIS — M79601 Pain in right arm: Secondary | ICD-10-CM | POA: Insufficient documentation

## 2016-08-30 DIAGNOSIS — M549 Dorsalgia, unspecified: Secondary | ICD-10-CM

## 2016-08-30 DIAGNOSIS — M542 Cervicalgia: Secondary | ICD-10-CM | POA: Insufficient documentation

## 2016-08-30 DIAGNOSIS — G8929 Other chronic pain: Secondary | ICD-10-CM

## 2016-08-30 DIAGNOSIS — M25512 Pain in left shoulder: Secondary | ICD-10-CM | POA: Insufficient documentation

## 2016-08-30 DIAGNOSIS — M25561 Pain in right knee: Secondary | ICD-10-CM | POA: Diagnosis not present

## 2016-08-30 DIAGNOSIS — M545 Low back pain: Secondary | ICD-10-CM

## 2016-08-30 LAB — COMPREHENSIVE METABOLIC PANEL
ALBUMIN: 4 g/dL (ref 3.5–5.0)
ALT: 12 U/L — ABNORMAL LOW (ref 17–63)
AST: 22 U/L (ref 15–41)
Alkaline Phosphatase: 72 U/L (ref 38–126)
Anion gap: 8 (ref 5–15)
BUN: 8 mg/dL (ref 6–20)
CHLORIDE: 104 mmol/L (ref 101–111)
CO2: 26 mmol/L (ref 22–32)
Calcium: 8.8 mg/dL — ABNORMAL LOW (ref 8.9–10.3)
Creatinine, Ser: 1.05 mg/dL (ref 0.61–1.24)
GFR calc Af Amer: >60 mL/min (ref >60)
GFR calc non Af Amer: >60 mL/min (ref >60)
GLUCOSE: 129 mg/dL — AB (ref 65–99)
POTASSIUM: 4.2 mmol/L (ref 3.5–5.1)
Sodium: 138 mmol/L (ref 135–145)
Total Bilirubin: 0.5 mg/dL (ref 0.3–1.2)
Total Protein: 7.4 g/dL (ref 6.5–8.1)

## 2016-08-30 LAB — SEDIMENTATION RATE: Sed Rate: 20 mm/hr (ref 0–20)

## 2016-08-30 LAB — MAGNESIUM: MAGNESIUM: 2.1 mg/dL (ref 1.7–2.4)

## 2016-08-30 LAB — C-REACTIVE PROTEIN: CRP: 2.6 mg/dL — AB (ref <1.0)

## 2016-09-02 ENCOUNTER — Other Ambulatory Visit: Payer: Self-pay | Admitting: Family Medicine

## 2016-09-02 LAB — 25-HYDROXY VITAMIN D LCMS D2+D3: 25-Hydroxy, Vitamin D: 11 ng/mL — ABNORMAL LOW

## 2016-09-02 LAB — 25-HYDROXYVITAMIN D LCMS D2+D3: 25-HYDROXY, VITAMIN D-3: 11 ng/mL

## 2016-09-02 NOTE — Telephone Encounter (Signed)
Last filled 08/22/16 60 0rf

## 2016-09-02 NOTE — Telephone Encounter (Signed)
Pt called requesting a refill on HYDROcodone-acetaminophen (NORCO/VICODIN) 5-325 MG tablet. He did go to pain management and ran some test and are waiting for the results. Pt is going on vacation for 2 weeks. Please advise, thank you!  Call pt @ [phone]

## 2016-09-05 MED ORDER — HYDROCODONE-ACETAMINOPHEN 5-325 MG PO TABS: 1.0000 | ORAL_TABLET | Freq: Four times a day (QID) | ORAL | 0 refills | Status: DC | PRN

## 2016-09-05 NOTE — Telephone Encounter (Signed)
Patient notified rx is ready for pick up

## 2016-09-13 ENCOUNTER — Ambulatory Visit
Admission: RE | Admit: 2016-09-13 | Discharge: 2016-09-13 | Disposition: A | Discharge status: Home / Self Care | Payer: Medicare Other | Source: Ambulatory Visit | Attending: Pain Medicine | Admitting: Pain Medicine

## 2016-09-13 DIAGNOSIS — G8929 Other chronic pain: Secondary | ICD-10-CM | POA: Diagnosis not present

## 2016-09-13 DIAGNOSIS — M50123 Cervical disc disorder at C6-C7 level with radiculopathy: Secondary | ICD-10-CM | POA: Diagnosis not present

## 2016-09-13 DIAGNOSIS — M4802 Spinal stenosis, cervical region: Secondary | ICD-10-CM | POA: Diagnosis not present

## 2016-09-13 DIAGNOSIS — M542 Cervicalgia: Secondary | ICD-10-CM | POA: Diagnosis not present

## 2016-09-13 DIAGNOSIS — M5412 Radiculopathy, cervical region: Secondary | ICD-10-CM

## 2016-09-13 DIAGNOSIS — M79601 Pain in right arm: Secondary | ICD-10-CM | POA: Diagnosis present

## 2016-09-19 ENCOUNTER — Other Ambulatory Visit: Payer: Self-pay | Admitting: *Deleted

## 2016-09-19 MED ORDER — HYDROCODONE-ACETAMINOPHEN 5-325 MG PO TABS: 1.0000 | ORAL_TABLET | Freq: Four times a day (QID) | ORAL | 0 refills | Status: DC | PRN

## 2016-09-19 NOTE — Telephone Encounter (Signed)
Pt requested a medication refill for Hydrocodone  Pt contact [phone]

## 2016-09-19 NOTE — Telephone Encounter (Signed)
Patient notified rx is ready to be picked up

## 2016-09-19 NOTE — Telephone Encounter (Signed)
Last OV 08/09/2016 last filled 09/05/16 60 0rf

## 2016-09-23 ENCOUNTER — Ambulatory Visit (INDEPENDENT_AMBULATORY_CARE_PROVIDER_SITE_OTHER): Payer: Medicare Other | Admitting: Family Medicine

## 2016-09-23 ENCOUNTER — Ambulatory Visit (INDEPENDENT_AMBULATORY_CARE_PROVIDER_SITE_OTHER): Payer: Medicare Other

## 2016-09-23 ENCOUNTER — Encounter: Payer: Self-pay | Admitting: Family Medicine

## 2016-09-23 VITALS — BP 130/86 | HR 95 | Temp 98.3°F | Wt 202.0 lb

## 2016-09-23 DIAGNOSIS — J441 Chronic obstructive pulmonary disease with (acute) exacerbation: Secondary | ICD-10-CM | POA: Insufficient documentation

## 2016-09-23 DIAGNOSIS — J449 Chronic obstructive pulmonary disease, unspecified: Secondary | ICD-10-CM | POA: Diagnosis not present

## 2016-09-23 MED ORDER — DOXYCYCLINE HYCLATE 100 MG PO TABS: 100.0000 mg | ORAL_TABLET | Freq: Two times a day (BID) | ORAL | 0 refills | Status: DC

## 2016-09-23 MED ORDER — PREDNISONE 20 MG PO TABS
40.0000 mg | ORAL_TABLET | Freq: Every day | ORAL | 0 refills | Status: DC
Start: 2016-09-23 — End: 2016-10-05

## 2016-09-23 NOTE — Patient Instructions (Signed)
Nice to see you. You likely have a sinus infection and a COPD exacerbation. We will obtain a chest x-ray. We'll start you on prednisone and doxycycline to treat this. You should use your albuterol every 6 hours for the next 2 days and then as needed. If you develop shortness of breath, cough productive of blood, fevers, or any new or changing symptoms please seek medical attention immediately.

## 2016-09-23 NOTE — Assessment & Plan Note (Signed)
Symptoms and exam most consistent with COPD exacerbation. Also with likely sinus infection. We'll treat with doxycycline and prednisone. Will obtain a chest x-ray. He'll use his albuterol every 6 hours for the next 2 days. Given return precautions. If not improving in the next 2-3 days he'll follow-up in the office.

## 2016-09-23 NOTE — Progress Notes (Signed)
Pre visit review using our clinic review tool, if applicable. No additional management support is needed unless otherwise documented below in the visit note. 

## 2016-09-23 NOTE — Progress Notes (Signed)
  [name], MD Phone: [phone]  Patrick Gregory is a 56 y.o. male who presents today for same day visit.  Patient notes 10 days of symptoms. Has had significant sinus pressure and congestion with blowing green mucus out of his nose. Notes he's been coughing significantly as well with some wheezing. Cough is productive of dark mucus. He notes no shortness of breath. He notes his ears are significantly full. No fevers. Has used Alka-Seltzer and Robitussin. He's been using his albuterol nebulizer as well.  PMH: Former smoker   ROS see history of present illness  Objective  Physical Exam Vitals:   09/23/16 1309 09/23/16 1322  BP: 130/86   Pulse: (!) 101 95  Temp: 98.3 F (36.8 C)     BP Readings from Last 3 Encounters:  09/23/16 130/86  08/29/16 (!) 99/45  08/25/16 120/80   Wt Readings from Last 3 Encounters:  09/23/16 202 lb (91.6 kg)  08/29/16 195 lb (88.5 kg)  08/25/16 200 lb (90.7 kg)    Physical Exam  Constitutional: No distress.  HENT:  Head: Normocephalic and atraumatic.  Mouth/Throat: Oropharynx is clear and moist. No oropharyngeal exudate.  Normal TMs bilaterally  Eyes: Conjunctivae are normal. Pupils are equal, round, and reactive to light.  Neck: Neck supple.  Cardiovascular: Normal rate, regular rhythm and normal heart sounds.   Pulmonary/Chest: Effort normal. No respiratory distress. He has wheezes (scattered expiratory and inspiratory wheezes). He has no rales.  Musculoskeletal: He exhibits no edema.  Lymphadenopathy:    He has no cervical adenopathy.  Neurological: He is alert. Gait normal.  Skin: He is not diaphoretic.     Assessment/Plan: Please see individual problem list.  COPD exacerbation ([place]) Symptoms and exam most consistent with COPD exacerbation. Also with likely sinus infection. We'll treat with doxycycline and prednisone. Will obtain a chest x-ray. He'll use his albuterol every 6 hours for the next 2 days. Given return precautions.  If not improving in the next 2-3 days he'll follow-up in the office.   Orders Placed This Encounter  Procedures  . DG Chest 2 View    Standing Status:   Future    Number of Occurrences:   1    Standing Expiration Date:   11/21/2017    Order Specific Question:   Reason for Exam (SYMPTOM  OR DIAGNOSIS REQUIRED)    Answer:   copd exacerbation, wheezing, cough productive of dark mucus    Order Specific Question:   Preferred imaging location?    Answer:   [facility] ordered this encounter  Medications  . doxycycline (VIBRA-TABS) 100 MG tablet    Sig: Take 1 tablet (100 mg total) by mouth 2 (two) times daily.    Dispense:  14 tablet    Refill:  0  . predniSONE (DELTASONE) 20 MG tablet    Sig: Take 2 tablets (40 mg total) by mouth daily with breakfast.    Dispense:  10 tablet    Refill:  0   [name], MD [place]

## 2016-09-28 ENCOUNTER — Other Ambulatory Visit: Payer: Self-pay | Admitting: *Deleted

## 2016-09-28 NOTE — Telephone Encounter (Signed)
Pt requested a medication refill for hydrocodone  Pt contact [phone] Pt requested Xray result, results can be left on voicemail

## 2016-09-28 NOTE — Telephone Encounter (Signed)
Last OV 09/23/16 last filled 09/19/16 60 0rf

## 2016-09-30 MED ORDER — HYDROCODONE-ACETAMINOPHEN 5-325 MG PO TABS: 1.0000 | ORAL_TABLET | Freq: Four times a day (QID) | ORAL | 0 refills | Status: DC | PRN

## 2016-09-30 NOTE — Telephone Encounter (Signed)
Patients fiance notified the rx is ready

## 2016-10-05 ENCOUNTER — Ambulatory Visit (INDEPENDENT_AMBULATORY_CARE_PROVIDER_SITE_OTHER): Payer: Medicare Other | Admitting: Family Medicine

## 2016-10-05 ENCOUNTER — Encounter: Payer: Self-pay | Admitting: Family Medicine

## 2016-10-05 DIAGNOSIS — J329 Chronic sinusitis, unspecified: Secondary | ICD-10-CM

## 2016-10-05 DIAGNOSIS — J0101 Acute recurrent maxillary sinusitis: Secondary | ICD-10-CM | POA: Diagnosis not present

## 2016-10-05 HISTORY — DX: Chronic sinusitis, unspecified: J32.9

## 2016-10-05 MED ORDER — AMOXICILLIN-POT CLAVULANATE 875-125 MG PO TABS: 1.0000 | ORAL_TABLET | Freq: Two times a day (BID) | ORAL | 0 refills | Status: DC

## 2016-10-05 NOTE — Patient Instructions (Signed)
Nice to see you. You have a sinus infection. We will treat this with Augmentin. If you develop worsening symptoms or you develop shortness of breath, cough productive of blood, or fevers please seek medical attention medially.

## 2016-10-05 NOTE — Progress Notes (Signed)
  [name], MD Phone: [phone]  Patrick Gregory is a 56 y.o. male who presents today for same-day visit.  Patient reports recurrence of his sinus congestion in his maxillary sinuses 5 days ago. He had been on doxycycline and within 2 days of coming off of this his cough symptoms recurred. He notes minimal cough. Nonproductive. Congestion is in his maxillary sinuses. He's blowing green mucus out of his nose again. No shortness of breath or fevers. Occasionally wheezes. He did take doxycycline and prednisone previously. No chest congestion.  PMH: Former smoker   ROS see history of present illness  Objective  Physical Exam Vitals:   10/05/16 1013  BP: 112/64  Pulse: 82  Temp: 98.7 F (37.1 C)    BP Readings from Last 3 Encounters:  10/05/16 112/64  09/23/16 130/86  08/29/16 (!) 99/45   Wt Readings from Last 3 Encounters:  10/05/16 205 lb 6.4 oz (93.2 kg)  09/23/16 202 lb (91.6 kg)  08/29/16 195 lb (88.5 kg)    Physical Exam  Constitutional: No distress.  HENT:  Head: Normocephalic and atraumatic.  Mouth/Throat: Oropharynx is clear and moist. No oropharyngeal exudate.  Normal TMs bilaterally  Eyes: Conjunctivae are normal. Pupils are equal, round, and reactive to light.  Cardiovascular: Normal rate, regular rhythm and normal heart sounds.   Pulmonary/Chest: Effort normal and breath sounds normal.  Neurological: He is alert. Gait normal.  Skin: Skin is warm and dry. He is not diaphoretic.     Assessment/Plan: Please see individual problem list.  Sinusitis Patient's symptoms are consistent with recurrent maxillary sinusitis after doxycycline therapy. No signs of COPD exacerbation. We will start on Augmentin. Advised on yogurt or probiotic. He'll continue to monitor and if he does not improve with this could consider Levaquin. Given return precautions.   No orders of the defined types were placed in this encounter.   Meds ordered this encounter  Medications    . amoxicillin-clavulanate (AUGMENTIN) 875-125 MG tablet    Sig: Take 1 tablet by mouth 2 (two) times daily.    Dispense:  14 tablet    Refill:  0    [name], MD [place]

## 2016-10-05 NOTE — Assessment & Plan Note (Signed)
Patient's symptoms are consistent with recurrent maxillary sinusitis after doxycycline therapy. No signs of COPD exacerbation. We will start on Augmentin. Advised on yogurt or probiotic. He'll continue to monitor and if he does not improve with this could consider Levaquin. Given return precautions.

## 2016-10-05 NOTE — Progress Notes (Signed)
Pre visit review using our clinic review tool, if applicable. No additional management support is needed unless otherwise documented below in the visit note. 

## 2016-10-12 ENCOUNTER — Other Ambulatory Visit: Payer: Self-pay | Admitting: Pain Medicine

## 2016-10-12 DIAGNOSIS — R7982 Elevated C-reactive protein (CRP): Secondary | ICD-10-CM

## 2016-10-12 DIAGNOSIS — E559 Vitamin D deficiency, unspecified: Secondary | ICD-10-CM

## 2016-10-12 HISTORY — DX: Vitamin D deficiency, unspecified: E55.9

## 2016-10-12 HISTORY — DX: Elevated C-reactive protein (CRP): R79.82

## 2016-10-12 MED ORDER — VITAMIN D3 50 MCG (2000 UT) PO CAPS: ORAL_CAPSULE | ORAL | 99 refills | Status: DC

## 2016-10-12 MED ORDER — VITAMIN D (ERGOCALCIFEROL) 1.25 MG (50000 UNIT) PO CAPS: ORAL_CAPSULE | ORAL | 0 refills | Status: DC

## 2016-10-12 NOTE — Progress Notes (Signed)
Reason for ordering the test: To determine the cause of the diffuse arthralgias & myalgias. Finding(s): Low Vitamin D levels Explanation of findings:  Low Vitamin D Results: Normal levels: between 30 and 100 ng/mL. Vitamin D Insufficiency: Levels between 20-30 ng/ml are defined as a "Vitamin D insufficiency". Vitamin D Deficiency: Levels below 20 ng/ml, is diagnosed as a "Vitamin D Deficiency". Common causes include: dietary insufficiency; inadequate sun exposure; inability to absorb vitamin D from the intestines; or inability to process it due to kidney or liver disease. Low 25-hydroxyvitamin D: A low blood level of 25-hydroxyvitamin D may mean that a person is not getting enough exposure to sunlight or enough dietary vitamin D to meet his or her body's demand or that there is a problem with its absorption from the intestines. Occasionally, drugs used to treat seizures, particularly phenytoin (Dilantin), can interfere with the production of 25-hydroxyvitamin D in the liver. There is some evidence that vitamin D deficiency may increase the risk of some cancers, immune diseases, and cardiovascular disease. Low 1,25-dihydroxyvitamin D: A low level of 1,25-dihydroxyvitamin D can be seen in kidney disease and is one of the earliest changes to occur in persons with early kidney failure. Associated complications may include: hypocalcemia, hypophosphatemia, and reduced bone density. Associated symptoms: Vitamin D deficiencies and insufficiencies may be associated with fatigue, weakness, bone pain, joint pain, and muscle pain. Recommendation(s): Patient may benefit from taking over-the-counter Vitamin D3 supplements. I recommend a vitamin D + Calcium supplements. "Natures Bounty", a brand easily found in most pharmacies, has a formulation containing Calcium 1200 mg plus Vitamin D3 1000 IU, in Softgels capsules that are easy to swallow. This should be taken once a day, preferably in the morning as vitamin D will  increase energy levels and make it difficult to fall asleep, if taken at night. Patients with levels lower than 20 ng/ml should contact their primary care physicians to receive replacement therapy. Vitamin D3 can be obtained over-the-counter, without a prescription. Vitamin D2 requires a prescription and it is used for replacement therapy. Normal CRP (C-reactive protein) Level(s): Less than <1.0 mg/dL. Elevated level(s): Levels above 1.0 mg/dL. Clinical significance: C-reactive protein (CRP) is produced by the liver. The level of CRP rises when there is inflammation throughout the body. CRP goes up in response to inflammation. High levels suggests the presence of chronic inflammation but do not identify its location or cause. Drops of previously elevated levels suggest that the inflammation or infection is subsiding and/or responding to treatment. Possible causes: High levels have been observed in obese patients, individuals with bacterial infections, chronic inflammation, or flare-ups of inflammatory conditions. Recommendations: - Consider the use of anti-inflammatory diet and medications.

## 2016-10-17 ENCOUNTER — Other Ambulatory Visit: Payer: Self-pay | Admitting: Family Medicine

## 2016-10-17 MED ORDER — HYDROCODONE-ACETAMINOPHEN 5-325 MG PO TABS: 1.0000 | ORAL_TABLET | Freq: Four times a day (QID) | ORAL | 0 refills | Status: DC | PRN

## 2016-10-17 NOTE — Telephone Encounter (Signed)
Pt fiance Mary called requesting a refill on pt's HYDROcodone-acetaminophen (NORCO/VICODIN) 5-325 MG tablet. Please advise, thank you!  Call pt @ [phone]

## 2016-10-17 NOTE — Telephone Encounter (Signed)
Last OV 10/05/16 last filled 09/30/16 60 0rf

## 2016-10-18 NOTE — Telephone Encounter (Signed)
Left message to notify rx is ready

## 2016-10-25 ENCOUNTER — Ambulatory Visit: Payer: Medicare Other | Admitting: Family Medicine

## 2016-11-02 ENCOUNTER — Telehealth: Payer: Self-pay | Admitting: Family Medicine

## 2016-11-02 NOTE — Telephone Encounter (Signed)
Last OV 10/05/16 last filled 10/17/16 60 0rf

## 2016-11-02 NOTE — Telephone Encounter (Signed)
Please find out from the patient when he is going back to see the pain specialist. It does not appear that he has an appointment. He needs to see them for help managing his pain. I will not be able to prescribe his pain medication going forward and he will need to get set up to follow-up with them. I will provide refills until they start to manage his pain medication, though if he is not going to see them I will taper him off. Thanks.

## 2016-11-02 NOTE — Telephone Encounter (Signed)
Pt called requesting a refill on HYDROcodone-acetaminophen (NORCO/VICODIN) 5-325 MG tablet. Please advise, thank you!  Call pt @ [phone]

## 2016-11-03 NOTE — Telephone Encounter (Signed)
Patients wife states they have not heard anything else after having MRI done. I gave her pain managements number to schedule an appointment. Notified her that we will only be able to fill this until he sees pain management.

## 2016-11-04 MED ORDER — HYDROCODONE-ACETAMINOPHEN 5-325 MG PO TABS: 1.0000 | ORAL_TABLET | Freq: Four times a day (QID) | ORAL | 0 refills | Status: DC | PRN

## 2016-11-04 NOTE — Telephone Encounter (Signed)
Patients wife notified

## 2016-11-04 NOTE — Telephone Encounter (Signed)
Refill given. They will need to have an appointment on the books with pain management prior to the next refill being given.

## 2016-11-16 NOTE — Progress Notes (Signed)
Results were reviewed and found to be: abnormal  Surgical consultation is recommended  Review would suggest interventional pain management techniques may be of benefit 

## 2016-11-21 MED ORDER — HYDROCODONE-ACETAMINOPHEN 5-325 MG PO TABS: 1.0000 | ORAL_TABLET | Freq: Four times a day (QID) | ORAL | 0 refills | Status: DC | PRN

## 2016-11-21 NOTE — Telephone Encounter (Signed)
He needs to contact the pain management clinic to see what the next step is. Does he need a refill on medication for this?

## 2016-11-21 NOTE — Telephone Encounter (Signed)
Pt called about wanting to know what to do he is still having pain. Pt was told he has to get the MRI done first before he can see pain management. Please advise?  Call pt @ [phone].

## 2016-11-21 NOTE — Telephone Encounter (Signed)
Patients wife states the patient is scheduled for a psych eval on 11/30/2016 and then he will be able to see pain management, patient does need a refill

## 2016-11-21 NOTE — Telephone Encounter (Signed)
Patient notified

## 2016-11-21 NOTE — Telephone Encounter (Signed)
Please advise, looks like he had an MRI done

## 2016-11-21 NOTE — Telephone Encounter (Signed)
Refill ordered

## 2016-11-30 DIAGNOSIS — F4542 Pain disorder with related psychological factors: Secondary | ICD-10-CM | POA: Diagnosis not present

## 2016-12-02 ENCOUNTER — Encounter: Payer: Self-pay | Admitting: Neurology

## 2016-12-02 ENCOUNTER — Ambulatory Visit (INDEPENDENT_AMBULATORY_CARE_PROVIDER_SITE_OTHER): Payer: Medicare Other | Admitting: Neurology

## 2016-12-02 VITALS — BP 118/64 | HR 85 | Temp 98.3°F | Ht 69.0 in | Wt 206.0 lb

## 2016-12-02 DIAGNOSIS — G40009 Localization-related (focal) (partial) idiopathic epilepsy and epileptic syndromes with seizures of localized onset, not intractable, without status epilepticus: Secondary | ICD-10-CM | POA: Diagnosis not present

## 2016-12-02 HISTORY — DX: Localization-related (focal) (partial) idiopathic epilepsy and epileptic syndromes with seizures of localized onset, not intractable, without status epilepticus: G40.009

## 2016-12-02 MED ORDER — LAMOTRIGINE 25 MG PO TABS: ORAL_TABLET | ORAL | 6 refills | Status: DC

## 2016-12-02 MED ORDER — LEVETIRACETAM 500 MG PO TABS: 500.0000 mg | ORAL_TABLET | Freq: Two times a day (BID) | ORAL | 11 refills | Status: DC

## 2016-12-02 NOTE — Progress Notes (Signed)
NEUROLOGY FOLLOW UP OFFICE NOTE  Patrick Gregory [id number]  HISTORY OF PRESENT ILLNESS: I had the pleasure of seeing Patrick Gregory in follow-up in the neurology clinic on 12/02/2016.  The patient was last seen 6 months ago for recurrent episodes concerning for seizures with episodes of unresponsiveness with falls. They continued to report episodes on Keppra 500mg  BID and were wondering about psychogenic seizures. He had a 48-hour EEG done which showed occasional focal slowing over the left temporal region, no epileptiform discharges seen, typical events not captured. He reports having a seizure every 1-2 weeks, last episode was 2 days ago while on the toilet he fell and hit his head and bruised his left arm. Prior to that, he had one a week before. He has fallen in the tub. He also reports passing out when he has coughing spells but not all the episodes of loss of consciousness are preceded by coughing. He now has a service dog they are training to nibble on him before a seizure. His mood is "jumpy," he has been through a lot the past 1.5 years, divorce, recently remarried 2 weeks ago, and moving houses. He denies any headaches, dizziness, vision changes, focal numbness/tingling/weakness. He has chronic neck and right arm pain. He continues to see his psychiatrist with no recent changes in medications.  HPI 03/03/2016: This is a pleasant 56 yo RH man with a history of COPD, chronic neck and back pain, PTSD, who presented for seizures. He reports that the first seizure occurred 3-4 years ago, he woke up on the floor then 2-3 hours later passed out again. He denied any prior warning symptoms. He was started on an unrecalled seizure medication which he took for a year then self-discontinued because he had been seizure-free.  He was event-free until January 2017 when he had 3 episodes in a week. The first episode he was sitting on the couch when his head went back and he became unresponsive for a few seconds, eyes  were closed. The next day, he was working on something on the wall when his uncle noticed he looked lost. He called to him and he snapped back. The third episode occurred 2 days later, again while sitting on the couch, his head went back with eyes closed, then he had mild jerking of both arms for a few seconds. No tongue bite or incontinence. When he came to, he told his fiancee he had a headache and felt nauseated. He had another mild episode 3 weeks ago. His is amnestic of these episodes, his whole body feels weak after, no focal weakness. He and his fiancee deny any staring/unresponsive episodes, no gaps in time. He denies any  olfactory/gustatory hallucinations, deja vu, rising epigastric sensation, myoclonic jerks. When he turns his head to the right, he has numbness over the left arm. He reports having degenerative disc disease and sees neurosurgery. He was started on Keppra 500mg  and denies any side effects since taking it the past 3 days. He was in the ER last 02/25/16 for gait difficulty, he was staggering, walking like was drunk. He was evaluated by Neurology at [facility], no focal weakness seen, finger to nose testing appeared functional. Symptoms lasted 2-3 days. He had not started Keppra when these symptoms began. He had an MRI brain with and without contrast which I personally reviewed, there were no acute changes, hippocampi symmetric with no abnormal signal or enhancement seen. He had a head CT the next day with no acute changes. The gait instability  has since resolved.  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 He has had several head injuries with loss of consciousness while he was in [employer], otherwise he had a normal birth and early development.  There is no history of febrile convulsions, CNS infections such as meningitis/encephalitis, neurosurgical procedures, or family history  of seizures.  PAST MEDICAL HISTORY: Past Medical History:  Diagnosis Date  . Allergic rhinitis   . Arthritis    shoulders, back  . Asthma   . Bipolar disorder ([place])   . Chickenpox   . COPD (chronic obstructive pulmonary disease) ([place])   . Depression   . Low back pain   . Seizures ([place])    last one approx 18 mos ago.  No meds approx 1 yr.  . Sleep apnea    supposed to use CPAP. Does not have.  . Stiff neck    limited side to side turn.  Left side pain when looking up.  . Throat cancer [facility])     MEDICATIONS: Current Outpatient Prescriptions on File Prior to Visit  Medication Sig Dispense Refill  . albuterol (PROVENTIL HFA;VENTOLIN HFA) 108 (90 Base) MCG/ACT inhaler Inhale 2 puffs into the lungs every 6 (six) hours as needed for wheezing or shortness of breath. 3 Inhaler 2  . amoxicillin-clavulanate (AUGMENTIN) 875-125 MG tablet Take 1 tablet by mouth 2 (two) times daily. 14 tablet 0  . benzonatate (TESSALON PERLES) 100 MG capsule Take 1 capsule (100 mg total) by mouth 3 (three) times daily as needed for cough. 30 capsule 0  . benztropine (COGENTIN) 1 MG tablet Take 1 mg by mouth 2 (two) times daily.    . budesonide-formoterol (SYMBICORT) 80-4.5 MCG/ACT inhaler Inhale 2 puffs into the lungs 2 (two) times daily. 3 Inhaler 3  . Cholecalciferol (VITAMIN D3) 2000 units capsule Take 1 capsule (2,000 Units total) by mouth daily. 30 capsule PRN  . clonazePAM (KLONOPIN) 1 MG tablet Take 1 mg by mouth 2 (two) times daily.    . cyclobenzaprine (FLEXERIL) 10 MG tablet Take 10 mg by mouth at bedtime.    Marland Kitchen HYDROcodone-acetaminophen (NORCO/VICODIN) 5-325 MG tablet Take 1 tablet by mouth every 6 (six) hours as needed for moderate pain. 60 tablet 0  . ipratropium-albuterol (DUONEB) 0.5-2.5 (3) MG/3ML SOLN Take 3 mLs by nebulization 2 (two) times daily.    Marland Kitchen levETIRAcetam (KEPPRA) 500 MG tablet Take 1 tablet (500 mg total) by mouth 2 (two) times daily. 60 tablet 5  . Lurasidone HCl 60 MG TABS Take  60 mg by mouth at bedtime.    Marland Kitchen omeprazole (PRILOSEC) 20 MG capsule Take 20 mg by mouth 2 (two) times daily before a meal.    . pantoprazole (PROTONIX) 40 MG tablet Take 1 tablet (40 mg total) by mouth 2 (two) times daily before a meal. 60 tablet 3  . pregabalin (LYRICA) 75 MG capsule Take 75 mg by mouth 2 (two) times daily.    . ranitidine (ZANTAC) 300 MG tablet Take 300 mg by mouth at bedtime.    . sildenafil (VIAGRA) 100 MG tablet Take 100 mg by mouth daily as needed for erectile dysfunction.    Marland Kitchen venlafaxine XR (EFFEXOR-XR) 150 MG 24 hr capsule Take 300 mg by mouth daily with breakfast.    . Vitamin D, Ergocalciferol, (DRISDOL) 50000 units CAPS capsule Take 1 capsule (50,000 Units total) by mouth  2 (two) times a week x 6 weeks. 12 capsule 0   No current facility-administered medications on file prior to visit.     ALLERGIES: No Active Allergies  FAMILY HISTORY: Family History  Problem Relation Age of Onset  . Alcoholism    . Arthritis    . Hyperlipidemia    . Heart disease    . Sudden death    . Kidney disease    . Mental illness    . Depression Mother   . Bipolar disorder Mother   . Depression Brother   . Bipolar disorder Brother   . Heart disease Brother   . Heart disease Brother     SOCIAL HISTORY: Social History   Social History  . Marital status: Single    Spouse name: N/A  . Number of children: N/A  . Years of education: N/A   Occupational History  . retired     Nature conservation officer   Social History Main Topics  . Smoking status: Former Smoker    Packs/day: 1.00    Years: 35.00    Types: Cigarettes    Quit date: 05/01/2014  . Smokeless tobacco: Never Used     Comment: smokes 1-2 cigarettes weekly.   . Alcohol use 0.0 oz/week     Comment: occ  . Drug use: No  . Sexual activity: Not on file   Other Topics Concern  . Not on file   Social History Narrative   Lives with fiancee. Lives in apartment on the 3rd floor. Sometimes feels weakness going up the stairs.  Formerly lived in [place]. Has been here for 8 months.     REVIEW OF SYSTEMS: Constitutional: No fevers, chills, or sweats, no generalized fatigue, change in appetite Eyes: No visual changes, double vision, eye pain Ear, nose and throat: No hearing loss, ear pain, nasal congestion, sore throat Cardiovascular: No chest pain, palpitations Respiratory:  No shortness of breath at rest or with exertion, wheezes GastrointestinaI: No nausea, vomiting, diarrhea, abdominal pain, fecal incontinence Genitourinary:  No dysuria, urinary retention or frequency Musculoskeletal:  No neck pain, back pain Integumentary: No rash, pruritus, skin lesions Neurological: as above Psychiatric: + depression, insomnia, anxiety Endocrine: No palpitations, fatigue, diaphoresis, mood swings, change in appetite, change in weight, increased thirst Hematologic/Lymphatic:  No anemia, purpura, petechiae. Allergic/Immunologic: no itchy/runny eyes, nasal congestion, recent allergic reactions, rashes  PHYSICAL EXAM: Vitals:   12/02/16 0833  BP: 118/64  Pulse: 85  Temp: 98.3 F (36.8 C)   General: No acute distress Head:  Normocephalic/atraumatic Neck: supple, no paraspinal tenderness, full range of motion Heart:  Regular rate and rhythm Lungs:  Clear to auscultation bilaterally Back: No paraspinal tenderness Skin/Extremities: No rash, no edema Neurological Exam: alert and oriented to person, place, and time. No aphasia or dysarthria. Fund of knowledge is appropriate.  Remote memory intact. 0/3 delayed recall.  Attention and concentration are normal.    Able to name objects and repeat phrases. Cranial nerves: Pupils equal, round, reactive to light. Extraocular movements intact with no nystagmus. Visual fields full. Facial sensation intact. No facial asymmetry. Tongue, uvula, palate midline.  Motor: Bulk and tone normal, muscle strength 5/5 throughout with no pronator drift.  Sensation to light touch intact.  No  extinction to double simultaneous stimulation.  Deep tendon reflexes 2+ throughout, toes downgoing.  Finger to nose testing intact.  Gait narrow-based and steady, able to tandem walk adequately.  Romberg negative.  IMPRESSION: This is a pleasant 56 yo RH man with a history  of COPD, chronic neck and back pain, PTSD, with a 3-4 year history of seizures. His EEG showed occasional left temporal slowing, no epileptiform discharges. MRI brain unremarkable. He is on Keppra 500mg  BID and continues to reports seizures with falls every 1-2 weeks. I am hesitant to increase Keppra dose due to mood issues, would start Lamotrigine 25mg  BID x 2 weeks, then increase to 50mg  BID. Side effects, including Kathreen Cosier syndrome, were discussed today. Continue Keppra 500mg  BID, we will plan to taper off once Lamotrigine dose is therapeutic. They had previously expressed concern about PNES, if he continues to have seizures despite adequate doses of AEDs, he will be referred for video EEG monitoring to classify his seizures. Continue Psychiatry follow-up. [place] driving laws were again discussed with the patient, and he knows to stop driving after a seizure, until 6 months seizure-free. He will follow-up in 2 months and knows to call for any changes.   Thank you for allowing me to participate in his care.  Please do not hesitate to call for any questions or concerns.  The duration of this appointment visit was 25 minutes of face-to-face time with the patient.  Greater than 50% of this time was spent in counseling, explanation of diagnosis, planning of further management, and coordination of care.   Ellouise Newer, M.D.   CC: Dr. Caryl Bis

## 2016-12-02 NOTE — Patient Instructions (Signed)
1. Start Lamotrigine 25mg : Take 1 tablet twice a day for 2 weeks, then increase to 2 tablets twice a day 2. Continue Keppra 500mg  twice a day 3. Keep a calendar of your seizures 4. Follow-up in 2 months, call us for any changes  Seizure Precautions: 1. If medication has been prescribed for you to prevent seizures, take it exactly as directed.  Do not stop taking the medicine without talking to your doctor first, even if you have not had a seizure in a long time.   2. Avoid activities in which a seizure would cause danger to yourself or to others.  Don't operate dangerous machinery, swim alone, or climb in high or dangerous places, such as on ladders, roofs, or girders.  Do not drive unless your doctor says you may.  3. If you have any warning that you may have a seizure, lay down in a safe place where you can't hurt yourself.    4.  No driving for 6 months from last seizure, as per [facility].   Please refer to the following link on the [place] website for more information: [email or web address]   5.  Maintain good sleep hygiene. Avoid alcohol.  6.  Contact your doctor if you have any problems that may be related to the medicine you are taking.  7.  Call 911 and bring the patient back to the ED if:        A.  The seizure lasts longer than 5 minutes.       B.  The patient doesn't awaken shortly after the seizure  C.  The patient has new problems such as difficulty seeing, speaking or moving  D.  The patient was injured during the seizure  E.  The patient has a temperature over 102 F (39C)  F.  The patient vomited and now is having trouble breathing

## 2016-12-05 ENCOUNTER — Telehealth: Payer: Self-pay | Admitting: Family Medicine

## 2016-12-05 NOTE — Telephone Encounter (Signed)
Spoke to patients wife and informed her we would have to wait to fill this until Dr.Sonnenberg returns. Patients wife verbalized understanding. She states she will call pain management to try to get a follow up scheduled since they have completed the psychiatric evaluation.

## 2016-12-05 NOTE — Telephone Encounter (Signed)
Pt spouse called and is stating that pt needs a refill on his HYDROcodone-acetaminophen (NORCO/VICODIN) 5-325 MG tablet. Pt is waiting for pain management to get back with them to schedule a follow up appointment. Please advise, thank you!  Call pt @ [phone]

## 2016-12-06 ENCOUNTER — Telehealth: Payer: Self-pay | Admitting: Family Medicine

## 2016-12-06 NOTE — Telephone Encounter (Signed)
Left pt message asking to call Allison back directly at [phone] to schedule AWV. Thanks! °

## 2016-12-08 ENCOUNTER — Ambulatory Visit: Payer: Medicare Other | Admitting: Pain Medicine

## 2016-12-08 NOTE — Progress Notes (Deleted)
Patient's Name: Patrick Gregory  MRN: [id number]  Referring Provider: Leone Haven, MD  DOB: Jun 07, 1961  PCP: Leone Haven, MD  DOS: 12/08/2016  Note by: Kathlen Brunswick. Dossie Arbour, MD  Service setting: Ambulatory outpatient  Specialty: Interventional Pain Management  Location: [facility] (AMB) Pain Management Facility    Patient type: Established   Primary Reason(s) for Visit: Encounter for evaluation before starting new chronic pain management plan of care (Level of risk: moderate) CC: No chief complaint on file.  HPI  Patrick Gregory is a 56 y.o. year old, male patient, who comes today for a follow-up evaluation to review the test results and decide on a treatment plan. He has Hoarseness; GERD (gastroesophageal reflux disease); Chronic neck pain (Location of Primary Source of Pain) (Bilateral) (R>L); Anxiety; Muscle cramps; Epigastric discomfort; COPD (chronic obstructive pulmonary disease) ([place]); DOE (dyspnea on exertion); Cough; Seizures ([place]); Chronic low back pain (Location of Tertiary source of pain) (Bilateral) (R>L); Transient alteration of awareness; PTSD (post-traumatic stress disorder); Cigarette smoker; Viral URI; Bipolar disorder ([place]); Sleep apnea; Chronic pain syndrome; Long term current use of opiate analgesic; Long term prescription opiate use; Opiate use; Chronic upper back pain (Location of Secondary source of pain) (Bilateral) (L>R); Chronic shoulder pain (Bilateral) (R>L); Rotator cuff syndrome (Right); Chronic upper extremity pain (Right); Cervical foraminal stenosis (Right); Chronic cervical radicular pain (Right) (intermittent); Chronic knee pain (Right); Osteoarthritis of knee (Right); COPD exacerbation ([place]); Sinusitis; Elevated C-reactive protein (CRP); Vitamin D deficiency; and Localization-related idiopathic epilepsy and epileptic syndromes with seizures of localized onset, not intractable, without status epilepticus ([place]) on his problem list. His primarily concern today is the No chief  complaint on file.  Pain Assessment: Self-Reported Pain Score:  /10             Reported level is compatible with observation.          Patrick Gregory comes in today for a follow-up visit after his initial evaluation on 08/29/2016. Today we went over the results of his tests. These were explained in "Layman's terms". During today's appointment we went over my diagnostic impression, as well as the proposed treatment plan.  In considering the treatment plan options, Patrick Gregory was reminded that I no longer take patients for medication management only. I asked him to let me know if he had no intention of taking advantage of the interventional therapies, so that we could make arrangements to provide this space to someone interested. I also made it clear that undergoing interventional therapies for the purpose of getting pain medications is very inappropriate on the part of a patient, and it will not be tolerated in this practice. This type of behavior would suggest true addiction and therefore it requires referral to an addiction specialist.   Further details on both, my assessment(s), as well as the proposed treatment plan, please see below. Controlled Substance Pharmacotherapy Assessment REMS (Risk Evaluation and Mitigation Strategy)  Analgesic: Hydrocodone/APAP 5/325 one tablet by mouth 4 times a day Highest recorded MME/day: 75 mg/day MME/day: 20 mg/day Pill Count: None expected due to no prior prescriptions written by our practice. Pharmacokinetics: Liberation and absorption (onset of action): WNL Distribution (time to peak effect): WNL Metabolism and excretion (duration of action): WNL         Pharmacodynamics: Desired effects: Analgesia: Patrick Gregory reports >50% benefit. Functional ability: Patient reports that medication allows him to accomplish basic ADLs Clinically meaningful improvement in function (CMIF): Sustained CMIF goals met Perceived effectiveness: Described as relatively effective,  allowing  for increase in activities of daily living (ADL) Undesirable effects: Side-effects or Adverse reactions: None reported Monitoring: [place] PMP: Online review of the past 41-monthperiod previously conducted. Not applicable at this point since we have not taken over the patient's medication management yet. List of all Serum Drug Screening Test(s):  No results found for: AMPHSCRSER, BARBSCRSER, BENZOSCRSER, COCAINSCRSER, PCPSCRSER, [place] O[place] PCarrollList of all UDS test(s) done:  No results found for: TOXASSSELUR, SUMMARY Last UDS on record: No results found for: TOXASSSELUR, SUMMARY UDS interpretation: No unexpected findings.          Medication Assessment Form: Patient introduced to form today Treatment compliance: Treatment may start today if patient agrees with proposed plan. Evaluation of compliance is not applicable at this point Risk Assessment Profile: Aberrant behavior: See initial evaluations. None observed or detected today Comorbid factors increasing risk of overdose: See initial evaluation. No additional risks detected today Risk Mitigation Strategies:  Patient opioid safety counseling: Completed today. Counseling provided to patient as per "Patient Counseling Document". Document signed by patient, attesting to counseling and understanding Patient-Prescriber Agreement (PPA): Obtained today  Controlled substance notification to other providers: Written and sent today  Pharmacologic Plan: Today we may be taking over the patient's pharmacological regimen. See below  Laboratory Chemistry  Inflammation Markers Lab Results  Component Value Date   CRP 2.6 (H) 08/30/2016   ESRSEDRATE 20 08/30/2016   (CRP: Acute Phase) (ESR: Chronic Phase) Renal Function Markers Lab Results  Component Value Date   BUN 8 08/30/2016   CREATININE 1.05 08/30/2016   GFRAA >60 08/30/2016   GFRNONAA >60 08/30/2016   Hepatic Function Markers Lab Results   Component Value Date   AST 22 08/30/2016   ALT 12 (L) 08/30/2016   ALBUMIN 4.0 08/30/2016   ALKPHOS 72 08/30/2016   Electrolytes Lab Results  Component Value Date   NA 138 08/30/2016   K 4.2 08/30/2016   CL 104 08/30/2016   CALCIUM 8.8 (L) 08/30/2016   MG 2.1 08/30/2016   Neuropathy Markers Lab Results  Component Value Date   VITAMINB12 383 06/13/2016   Bone Pathology Markers Lab Results  Component Value Date   ALKPHOS 72 08/30/2016   25OHVITD1 11 (L) 08/30/2016   25OHVITD2 <1.0 08/30/2016   25OHVITD3 11 08/30/2016   CALCIUM 8.8 (L) 08/30/2016   Coagulation Parameters Lab Results  Component Value Date   PLT 293 02/25/2016   Cardiovascular Markers Lab Results  Component Value Date   HGB 16.8 02/25/2016   HCT 47.8 02/25/2016   Note: Lab results reviewed.  Recent Diagnostic Imaging Review  Patrick Cervical Spine Wo Gregory Result Date: 09/13/2016 CLINICAL DATA:  Neck pain.  Right arm pain.  Cervical radiculopathy. EXAM: MRI CERVICAL SPINE WITHOUT Gregory TECHNIQUE: Multiplanar, multisequence Patrick imaging of the cervical spine was performed. No intravenous Gregory was administered. COMPARISON:  Cervical radiographs 08/30/2016. Outside cervical MRI 10/05/2015 FINDINGS: Alignment: Mild anterolisthesis C2-3. Remaining alignment normal. Mild straightening of cervical lordosis Vertebrae: Negative for fracture or mass.  Normal bone marrow. Cord: Normal signal and morphology Posterior Fossa, vertebral arteries, paraspinal tissues: Negative Disc levels: C2-3:  Negative C3-4:  Negative C4-5:  Negative C5-6: Mild disc degeneration and uncinate spurring. No significant spinal stenosis. Minimal foraminal narrowing bilaterally. C6-7: Central and right foraminal disc protrusion. There is significant right foraminal encroachment with impingement of the right C7 nerve root due to disc protrusion and spurring. This was present previously and may have progressed in the interval. Mild spinal  stenosis without  cord deformity C7-T1:  Negative IMPRESSION: Right foraminal disc protrusion and spurring at C6-7 causing right foraminal encroachment and impingement of the right C7 nerve root. Right foraminal encroachment appears to have progressed since the prior study. Mild spinal stenosis C6-7 Mild degenerative change at C5-6 without stenosis. Electronically Signed   By: Franchot Gallo M.D.   On: 09/13/2016 15:03   Cervical Imaging: Cervical Patrick wo Gregory:  Results for orders placed during the hospital encounter of 09/13/16  Patrick CERVICAL SPINE WO Gregory   Narrative CLINICAL DATA:  Neck pain.  Right arm pain.  Cervical radiculopathy.  EXAM: MRI CERVICAL SPINE WITHOUT Gregory  TECHNIQUE: Multiplanar, multisequence Patrick imaging of the cervical spine was performed. No intravenous Gregory was administered.  COMPARISON:  Cervical radiographs 08/30/2016. Outside cervical MRI 10/05/2015  FINDINGS: Alignment: Mild anterolisthesis C2-3. Remaining alignment normal. Mild straightening of cervical lordosis  Vertebrae: Negative for fracture or mass.  Normal bone marrow.  Cord: Normal signal and morphology  Posterior Fossa, vertebral arteries, paraspinal tissues: Negative  Disc levels:  C2-3:  Negative  C3-4:  Negative  C4-5:  Negative  C5-6: Mild disc degeneration and uncinate spurring. No significant spinal stenosis. Minimal foraminal narrowing bilaterally.  C6-7: Central and right foraminal disc protrusion. There is significant right foraminal encroachment with impingement of the right C7 nerve root due to disc protrusion and spurring. This was present previously and may have progressed in the interval. Mild spinal stenosis without cord deformity  C7-T1:  Negative  IMPRESSION: Right foraminal disc protrusion and spurring at C6-7 causing right foraminal encroachment and impingement of the right C7 nerve root. Right foraminal encroachment appears to have progressed since  the prior study. Mild spinal stenosis C6-7  Mild degenerative change at C5-6 without stenosis.   Electronically Signed   By: Franchot Gallo M.D.   On: 09/13/2016 15:03    Cervical DG complete:  Results for orders placed during the hospital encounter of 08/30/16  DG Cervical Spine Complete   Narrative CLINICAL DATA:  Chronic neck pain, no known injury, initial encounter  EXAM: CERVICAL SPINE - COMPLETE 4+ VIEW  COMPARISON:  09/25/2015  FINDINGS: Seven cervical segments are well visualized. Vertebral body height is well maintained. Mild osteophytic changes are noted. Facet hypertrophic changes are seen as well. Neural foramina appear patent as visualized. No acute fracture or acute facet abnormality is noted. No soft tissue changes are seen.  IMPRESSION: Degenerative change without acute abnormality.   Electronically Signed   By: Inez Catalina M.D.   On: 08/30/2016 13:46    Shoulder Imaging: Shoulder-R DG:  Results for orders placed during the hospital encounter of 08/30/16  DG Shoulder Right   Narrative CLINICAL DATA:  Chronic right shoulder pain, no known injury, initial encounter  EXAM: RIGHT SHOULDER - 2+ VIEW  COMPARISON:  None.  FINDINGS: Postsurgical changes consistent with rotator cuff repair are noted in the right proximal humerus. No acute fracture or dislocation is seen. No soft tissue abnormality is noted. The underlying bony thorax is within normal limits.  IMPRESSION: No acute abnormality noted.   Electronically Signed   By: Inez Catalina M.D.   On: 08/30/2016 13:55    Shoulder-L DG:  Results for orders placed during the hospital encounter of 08/30/16  DG Shoulder Left   Narrative CLINICAL DATA:  Chronic left shoulder pain common no known injury, initial encounter  EXAM: LEFT SHOULDER - 2+ VIEW  COMPARISON:  None.  FINDINGS: Minimal degenerative changes of the acromioclavicular joint are seen.  No acute fracture or dislocation is  noted. The underlying bony thorax is within normal limits.  IMPRESSION: No acute abnormality noted.   Electronically Signed   By: Inez Catalina M.D.   On: 08/30/2016 13:55    Thoracic Imaging: Thoracic DG 2-3 views:  Results for orders placed during the hospital encounter of 08/30/16  DG Thoracic Spine 2 View   Narrative CLINICAL DATA:  Chronic upper back pain, no known injury, initial encounter  EXAM: THORACIC SPINE 2 VIEWS  COMPARISON:  None.  FINDINGS: Vertebral body height is well maintained. Mild osteophytic changes are noted. No paraspinal mass lesion is seen. No rib abnormality is noted.  IMPRESSION: No acute abnormality noted.   Electronically Signed   By: Inez Catalina M.D.   On: 08/30/2016 13:49    Lumbosacral Imaging: Lumbar Patrick wo Gregory:  Results for orders placed during the hospital encounter of 02/24/16  Patrick Lumbar Spine Wo Gregory   Narrative CLINICAL DATA:  Low back pain. RIGHT leg pain, numbness and tingling. EXAM: MRI LUMBAR SPINE WITHOUT Gregory TECHNIQUE: Multiplanar, multisequence Patrick imaging of the lumbar spine was performed. No intravenous Gregory was administered. COMPARISON:  None. FINDINGS: Segmentation:  Standard. Alignment:  Physiologic. Vertebrae:  No fracture, evidence of discitis, or bone lesion. Conus medullaris: Extends to the L1 level and appears normal. 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 No mass or hydronephrosis. Disc levels: L1-L2:  Normal L2-L3:  Normal L3-L4:  Normal L4-L5: Central annular bulge. Mild facet arthropathy. Annular tears are seen in both foramina, with subligamentous protrusions on the RIGHT and LEFT. In conjunction with facet hypertrophy, there is foraminal narrowing bilaterally which could affect either the RIGHT or LEFT L4 nerve root. L5-S1: Annular bulge. Mild facet arthropathy. No significant subarticular zone or foraminal zone  narrowing. IMPRESSION: Potentially symptomatic annular tears with subligamentous protrusions at L4-5 bilaterally. Either L4 nerve root could be irritated. Correlate clinically as a cause of RIGHT leg pain. Electronically Signed   By: Staci Righter M.D.   On: 02/24/2016 19:55   Lumbar DG Bending views:  Results for orders placed during the hospital encounter of 08/30/16  DG Lumbar Spine Complete W/Bend   Narrative CLINICAL DATA:  Low back pain  EXAM: LUMBAR SPINE - COMPLETE WITH BENDING VIEWS  COMPARISON:  02/24/2016  FINDINGS: Five lumbar type vertebral bodies are well visualized. Vertebral body height is well maintained. Mild facet hypertrophic changes are noted bilaterally at all lumbar levels. No pars defects are seen. No anterolisthesis is noted. There is minimal anterior motion of L4 with respect L5 of approximately 3 mm on flexion. This is likely of a degenerative nature. No other focal abnormality is seen. No ligamentous calcifications are seen. Very mild osteophytes are noted at the L2-3 and L3-4 levels.  IMPRESSION: Minimal anterolisthesis of L4 on L5 on flexion. No pars defects are seen. Minimal osteophytic changes are noted.   Electronically Signed   By: Inez Catalina M.D.   On: 08/30/2016 13:52    Spine Outside Patrick Films:  Results for orders placed in visit on 11/19/15  Patrick Virgil   DG Spine outside:  Results for orders placed in visit on 11/17/15  DG Outside Films Spine   Knee Imaging: Knee-R DG 1-2 views:  Results for orders placed during the hospital encounter of 08/30/16  DG Knee 1-2 Views Right   Narrative CLINICAL DATA:  Chronic right knee pain, initial encounter  EXAM: RIGHT KNEE - 1-2 VIEW  COMPARISON:  None.  FINDINGS: Mild degenerative changes are noted in the medial joint space. No acute fracture or acute dislocation is noted. No soft tissue changes are seen.  IMPRESSION: Mild medial joint space narrowing.  No acute  abnormality noted.   Electronically Signed   By: Inez Catalina M.D.   On: 08/30/2016 13:48    Note: Results of ordered imaging test(s) reviewed and explained to patient in Layman's terms. Copy of results provided to patient  Meds  The patient has a current medication list which includes the following prescription(s): albuterol, amoxicillin-clavulanate, benzonatate, benztropine, budesonide-formoterol, vitamin d3, clonazepam, cyclobenzaprine, hydrocodone-acetaminophen, ipratropium-albuterol, lamotrigine, levetiracetam, lurasidone hcl, omeprazole, pantoprazole, pregabalin, ranitidine, sildenafil, venlafaxine xr, and vitamin d (ergocalciferol).  Current Outpatient Prescriptions on File Prior to Visit  Medication Sig  . albuterol (PROVENTIL HFA;VENTOLIN HFA) 108 (90 Base) MCG/ACT inhaler Inhale 2 puffs into the lungs every 6 (six) hours as needed for wheezing or shortness of breath.  Marland Kitchen amoxicillin-clavulanate (AUGMENTIN) 875-125 MG tablet Take 1 tablet by mouth 2 (two) times daily.  . benzonatate (TESSALON PERLES) 100 MG capsule Take 1 capsule (100 mg total) by mouth 3 (three) times daily as needed for cough.  . benztropine (COGENTIN) 1 MG tablet Take 1 mg by mouth 2 (two) times daily.  . budesonide-formoterol (SYMBICORT) 80-4.5 MCG/ACT inhaler Inhale 2 puffs into the lungs 2 (two) times daily.  . Cholecalciferol (VITAMIN D3) 2000 units capsule Take 1 capsule (2,000 Units total) by mouth daily. (Patient not taking: Reported on 12/02/2016)  . clonazePAM (KLONOPIN) 1 MG tablet Take 1 mg by mouth 2 (two) times daily.  . cyclobenzaprine (FLEXERIL) 10 MG tablet Take 10 mg by mouth at bedtime.  Marland Kitchen HYDROcodone-acetaminophen (NORCO/VICODIN) 5-325 MG tablet Take 1 tablet by mouth every 6 (six) hours as needed for moderate pain.  Marland Kitchen ipratropium-albuterol (DUONEB) 0.5-2.5 (3) MG/3ML SOLN Take 3 mLs by nebulization 2 (two) times daily.  Marland Kitchen lamoTRIgine (LAMICTAL) 25 MG tablet Take 1 tablet twice a day for 2 weeks,  then increase to 2 tablets twice a day  . levETIRAcetam (KEPPRA) 500 MG tablet Take 1 tablet (500 mg total) by mouth 2 (two) times daily.  . Lurasidone HCl 60 MG TABS Take 60 mg by mouth at bedtime.  Marland Kitchen omeprazole (PRILOSEC) 20 MG capsule Take 20 mg by mouth 2 (two) times daily before a meal.  . pantoprazole (PROTONIX) 40 MG tablet Take 1 tablet (40 mg total) by mouth 2 (two) times daily before a meal. (Patient not taking: Reported on 12/02/2016)  . pregabalin (LYRICA) 75 MG capsule Take 75 mg by mouth 2 (two) times daily.  . ranitidine (ZANTAC) 300 MG tablet Take 300 mg by mouth at bedtime.  . sildenafil (VIAGRA) 100 MG tablet Take 100 mg by mouth daily as needed for erectile dysfunction.  Marland Kitchen venlafaxine XR (EFFEXOR-XR) 150 MG 24 hr capsule Take 300 mg by mouth daily with breakfast.  . Vitamin D, Ergocalciferol, (DRISDOL) 50000 units CAPS capsule Take 1 capsule (50,000 Units total) by mouth 2 (two) times a week x 6 weeks. (Patient not taking: Reported on 12/02/2016)   No current facility-administered medications on file prior to visit.    ROS  Constitutional: Denies any fever or chills Gastrointestinal: No reported hemesis, hematochezia, vomiting, or acute GI distress Musculoskeletal: Denies any acute onset joint swelling, redness, loss of ROM, or weakness Neurological: No reported episodes of acute onset apraxia, aphasia, dysarthria, agnosia, amnesia, paralysis, loss of coordination, or loss of consciousness  Allergies  Patrick. Gregory has no  active allergies.  [place]  Drug: Patrick Gregory  reports that he does not use drugs. Alcohol:  reports that he drinks alcohol. Tobacco:  reports that he quit smoking about 2 years ago. His smoking use included Cigarettes. He has a 35.00 pack-year smoking history. He has never used smokeless tobacco. Medical:  has a past medical history of Allergic rhinitis; Arthritis; Asthma; Bipolar disorder ([place]); Chickenpox; COPD (chronic obstructive pulmonary disease) ([place]);  Depression; Low back pain; Seizures ([place]); Sleep apnea; Stiff neck; and Throat cancer ([place]). Family: family history includes Bipolar disorder in his brother and mother; Depression in his brother and mother; Heart disease in his brother and brother.  Past Surgical History:  Procedure Laterality Date  . DIRECT LARYNGOSCOPY Right 10/23/2015   Procedure: MICO DIRECT LARYNGOSCOPY WITH EXCISION RIGHT VOCAL CORD LESION;  Surgeon: Beverly Gust, MD;  Location: [place];  Service: ENT;  Laterality: Right;  . ROTATOR CUFF REPAIR Bilateral 2005, 2013  . THROAT SURGERY     Constitutional Exam  General appearance: Well nourished, well developed, and well hydrated. In no apparent acute distress There were no vitals filed for this visit. BMI Assessment: Estimated body mass index is 30.42 kg/m as calculated from the following:   Height as of 12/02/16: '5\' 9"'  (1.753 m).   Weight as of 12/02/16: 206 lb (93.4 kg).  BMI interpretation table: BMI level Category Range association with higher incidence of chronic pain  <18 kg/m2 Underweight   18.5-24.9 kg/m2 Ideal body weight   25-29.9 kg/m2 Overweight Increased incidence by 20%  30-34.9 kg/m2 Obese (Class I) Increased incidence by 68%  35-39.9 kg/m2 Severe obesity (Class II) Increased incidence by 136%  >40 kg/m2 Extreme obesity (Class III) Increased incidence by 254%   BMI Readings from Last 4 Encounters:  12/02/16 30.42 kg/m  10/05/16 30.33 kg/m  09/23/16 29.83 kg/m  08/29/16 28.80 kg/m   Wt Readings from Last 4 Encounters:  12/02/16 206 lb (93.4 kg)  10/05/16 205 lb 6.4 oz (93.2 kg)  09/23/16 202 lb (91.6 kg)  08/29/16 195 lb (88.5 kg)  Psych/Mental status: Alert, oriented x 3 (person, place, & time)       Eyes: PERLA Respiratory: No evidence of acute respiratory distress  Cervical Spine Exam  Inspection: No masses, redness, or swelling Alignment: Symmetrical Functional ROM: Unrestricted ROM      Stability: No instability  detected Muscle strength & Tone: Functionally intact Sensory: Unimpaired Palpation: No palpable anomalies              Upper Extremity (UE) Exam    Side: Right upper extremity  Side: Left upper extremity  Inspection: No masses, redness, swelling, or asymmetry. No contractures  Inspection: No masses, redness, swelling, or asymmetry. No contractures  Functional ROM: Unrestricted ROM          Functional ROM: Unrestricted ROM          Muscle strength & Tone: Functionally intact  Muscle strength & Tone: Functionally intact  Sensory: Unimpaired  Sensory: Unimpaired  Palpation: No palpable anomalies              Palpation: No palpable anomalies              Specialized Test(s): Deferred         Specialized Test(s): Deferred          Thoracic Spine Exam  Inspection: No masses, redness, or swelling Alignment: Symmetrical Functional ROM: Unrestricted ROM Stability: No instability detected Sensory: Unimpaired Muscle strength & Tone: No  palpable anomalies  Lumbar Spine Exam  Inspection: No masses, redness, or swelling Alignment: Symmetrical Functional ROM: Unrestricted ROM      Stability: No instability detected Muscle strength & Tone: Functionally intact Sensory: Unimpaired Palpation: No palpable anomalies       Provocative Tests: Lumbar Hyperextension and rotation test: evaluation deferred today       Patrick's Maneuver: evaluation deferred today                    Gait & Posture Assessment  Ambulation: Unassisted Gait: Relatively normal for age and body habitus Posture: WNL   Lower Extremity Exam    Side: Right lower extremity  Side: Left lower extremity  Inspection: No masses, redness, swelling, or asymmetry. No contractures  Inspection: No masses, redness, swelling, or asymmetry. No contractures  Functional ROM: Unrestricted ROM          Functional ROM: Unrestricted ROM          Muscle strength & Tone: Functionally intact  Muscle strength & Tone: Functionally intact  Sensory:  Unimpaired  Sensory: Unimpaired  Palpation: No palpable anomalies  Palpation: No palpable anomalies   Assessment & Plan  Primary Diagnosis & Pertinent Problem List: The primary encounter diagnosis was Chronic neck pain (Location of Primary Source of Pain) (Bilateral) (R>L). Diagnoses of Chronic upper back pain (Location of Secondary source of pain) (Bilateral) (L>R), Chronic low back pain (Location of Tertiary source of pain) (Bilateral) (R>L), Long term prescription opiate use, and Opiate use were also pertinent to this visit.  Visit Diagnosis: 1. Chronic neck pain (Location of Primary Source of Pain) (Bilateral) (R>L)   2. Chronic upper back pain (Location of Secondary source of pain) (Bilateral) (L>R)   3. Chronic low back pain (Location of Tertiary source of pain) (Bilateral) (R>L)   4. Long term prescription opiate use   5. Opiate use    Problems updated and reviewed during this visit: No problems updated. Problem-specific Plan(s): No problem-specific Assessment & Plan notes found for this encounter.  Assessment & plan notes cannot be loaded without a specified hospital service.  Plan of Care  Pharmacotherapy (Medications Ordered): No orders of the defined types were placed in this encounter.  Lab-work, procedure(s), and/or referral(s): No orders of the defined types were placed in this encounter.   Pharmacotherapy: Opioid Analgesics: We'll take over management today. See above orders Membrane stabilizer: We have discussed the possibility of optimizing this mode of therapy, if tolerated Muscle relaxant: We have discussed the possibility of a trial NSAID: We have discussed the possibility of a trial Other analgesic(s): To be determined at a later time   Interventional therapies: Planned, scheduled, and/or pending:    ***   Considering:   Diagnostic right-sided cervical epidural steroid injection under fluoroscopic guidance  Diagnostic bilateral cervical facet block under  fluoroscopic guidance  Possible bilateral cervical facet radiofrequency ablation  Bilateral intra-articular shoulder joint injection under fluoroscopic guidance  Diagnostic bilateral suprascapular nerve block under fluoroscopic guidance  Possible bilateral suprascapular nerve radiofrequency ablation  Diagnostic bilateral lumbar facet block under fluoroscopic guidance  Possible bilateral lumbar facet radiofrequency ablation  Diagnostic right-sided intra-articular knee joint injection with local anesthetic and steroid  Diagnostic right-sided genicular nerve blocks under fluoroscopic guidance  Possible right-sided genicular nerve radiofrequency ablation    PRN Procedures:   To be determined at a later time   Provider-requested follow-up: No Follow-up on file.  Future Appointments Date Time Provider [place]  12/08/2016 9:00 AM Milinda Pointer,  MD [facility] None  02/03/2017 1:00 PM Cameron Sprang, MD [facility] None    Primary Care Physician: Leone Haven, MD Location: [facility] Note by: Kathlen Brunswick. Dossie Arbour, M.D, DABA, DABAPM, DABPM, DABIPP, FIPP Date: 12/08/2016; Time: 8:41 AM  Patient instructions provided during this appointment: There are no Patient Instructions on file for this visit.

## 2016-12-12 NOTE — Progress Notes (Signed)
Patient's Name: Patrick Gregory  MRN: [id number]  Referring Provider: Leone Haven, MD  DOB: [birth year]  PCP: Leone Haven, MD  DOS: 12/13/2016  Note by: Kathlen Brunswick. Dossie Arbour, MD  Service setting: Ambulatory outpatient  Specialty: Interventional Pain Management  Location: [facility] (AMB) Pain Management Facility    Patient type: Established   Primary Reason(s) for Visit: Encounter for evaluation before starting new chronic pain management plan of care (Level of risk: moderate) CC: Neck Pain; Shoulder Pain (bilateral); and Arm Pain (right when turning head to right)  HPI  Patrick Gregory is a 56 y.o. year old, male patient, who comes today for a follow-up evaluation to review the test results and decide on a treatment plan. He has Hoarseness; GERD (gastroesophageal reflux disease); Chronic neck pain (Location of Primary Source of Pain) (Bilateral) (R>L); Anxiety; Muscle cramps; Epigastric discomfort; COPD (chronic obstructive pulmonary disease) ([place]); DOE (dyspnea on exertion); Cough; Seizures ([place]); Chronic low back pain (Location of Tertiary source of pain) (Bilateral) (R>L); Transient alteration of awareness; PTSD (post-traumatic stress disorder); Cigarette smoker; Viral URI; Bipolar disorder ([place]); Sleep apnea; Chronic pain syndrome; Long term current use of opiate analgesic; Long term prescription opiate use; Opiate use (20 MME/Day); Chronic upper back pain (Location of Secondary source of pain) (Bilateral) (L>R); Chronic shoulder pain (Bilateral) (R>L); Rotator cuff syndrome (Right); Chronic upper extremity pain (Right); Cervical foraminal stenosis (Right); Chronic cervical radicular pain (Right) (intermittent); Chronic knee pain (Right); Osteoarthritis of knee (Right); COPD exacerbation ([place]); Sinusitis; Elevated C-reactive protein (CRP); Vitamin D deficiency; Localization-related idiopathic epilepsy and epileptic syndromes with seizures of localized onset, not intractable, without status epilepticus ([place]);  Musculoskeletal pain; and Neuropathic pain on his problem list. His primarily concern today is the Neck Pain; Shoulder Pain (bilateral); and Arm Pain (right when turning head to right)  Pain Assessment: Self-Reported Pain Score: 7 /10 Clinically the patient looks like a 2/10 Reported level is inconsistent with clinical observations. Information on the proper use of the pain scale provided to the patient today Pain Descriptors / Indicators: Burning, Sharp Pain Frequency: Constant  Patrick Gregory comes in today for a follow-up visit after his initial evaluation on 08/29/2016. Today we went over the results of his tests. These were explained in "Layman's terms". During today's appointment we went over my diagnostic impression, as well as the proposed treatment plan.  In considering the treatment plan options, Patrick Gregory was reminded that I no longer take patients for medication management only. I asked him to let me know if he had no intention of taking advantage of the interventional therapies, so that we could make arrangements to provide this space to someone interested. I also made it clear that undergoing interventional therapies for the purpose of getting pain medications is very inappropriate on the part of a patient, and it will not be tolerated in this practice. This type of behavior would suggest true addiction and therefore it requires referral to an addiction specialist.   Further details on both, my assessment(s), as well as the proposed treatment plan, please see below. Controlled Substance Pharmacotherapy Assessment REMS (Risk Evaluation and Mitigation Strategy)  Analgesic: Hydrocodone/APAP 5/325 one tablet by mouth 4 times a day Highest recorded MME/day: 75 mg/day MME/day: 20 mg/day Pill Count: None expected due to no prior prescriptions written by our practice. Pharmacokinetics: Liberation and absorption (onset of action): WNL Distribution (time to peak effect): WNL Metabolism and  excretion (duration of action): WNL         Pharmacodynamics: Desired effects:  Analgesia: Patrick Gregory reports >50% benefit. Functional ability: Patient reports that medication allows him to accomplish basic ADLs Clinically meaningful improvement in function (CMIF): Sustained CMIF goals met Perceived effectiveness: Described as relatively effective, allowing for increase in activities of daily living (ADL) Undesirable effects: Side-effects or Adverse reactions: None reported Monitoring: [place] PMP: Online review of the past 42-monthperiod previously conducted. Not applicable at this point since we have not taken over the patient's medication management yet. List of all Serum Drug Screening Test(s):  No results found for: AMPHSCRSER, BARBSCRSER, BENZOSCRSER, COCAINSCRSER, PCPSCRSER, [place] Mountain[place] of all UDS test(s) done:  No results found for: TOXASSSELUR, SUMMARY Last UDS on record: No results found for: TOXASSSELUR, SUMMARY UDS interpretation: No unexpected findings.          Medication Assessment Form: Patient introduced to form today Treatment compliance: Treatment may start today if patient agrees with proposed plan. Evaluation of compliance is not applicable at this point Risk Assessment Profile: Aberrant behavior: See initial evaluations. None observed or detected today Comorbid factors increasing risk of overdose: See initial evaluation. No additional risks detected today Risk Mitigation Strategies:  Patient opioid safety counseling: Completed today. Counseling provided to patient as per "Patient Counseling Document". Document signed by patient, attesting to counseling and understanding Patient-Prescriber Agreement (PPA): Obtained today  Controlled substance notification to other providers: Written and sent today  Pharmacologic Plan: Today we may be taking over the patient's pharmacological regimen. See below  Laboratory Chemistry  Inflammation  Markers Lab Results  Component Value Date   CRP 2.6 (H) 08/30/2016   ESRSEDRATE 20 08/30/2016   (CRP: Acute Phase) (ESR: Chronic Phase) Renal Function Markers Lab Results  Component Value Date   BUN 8 08/30/2016   CREATININE 1.05 08/30/2016   GFRAA >60 08/30/2016   GFRNONAA >60 08/30/2016   Hepatic Function Markers Lab Results  Component Value Date   AST 22 08/30/2016   ALT 12 (L) 08/30/2016   ALBUMIN 4.0 08/30/2016   ALKPHOS 72 08/30/2016   Electrolytes Lab Results  Component Value Date   NA 138 08/30/2016   K 4.2 08/30/2016   CL 104 08/30/2016   CALCIUM 8.8 (L) 08/30/2016   MG 2.1 08/30/2016   Neuropathy Markers Lab Results  Component Value Date   VITAMINB12 383 06/13/2016   Bone Pathology Markers Lab Results  Component Value Date   ALKPHOS 72 08/30/2016   25OHVITD1 11 (L) 08/30/2016   25OHVITD2 <1.0 08/30/2016   25OHVITD3 11 08/30/2016   CALCIUM 8.8 (L) 08/30/2016   Coagulation Parameters Lab Results  Component Value Date   PLT 293 02/25/2016   Cardiovascular Markers Lab Results  Component Value Date   HGB 16.8 02/25/2016   HCT 47.8 02/25/2016   Note: Lab results reviewed.  Recent Diagnostic Imaging Review  Mr Cervical Spine Wo Contrast Result Date: 09/13/2016 CLINICAL DATA:  Neck pain.  Right arm pain.  Cervical radiculopathy. EXAM: MRI CERVICAL SPINE WITHOUT CONTRAST TECHNIQUE: Multiplanar, multisequence MR imaging of the cervical spine was performed. No intravenous contrast was administered. COMPARISON:  Cervical radiographs 08/30/2016. Outside cervical MRI 10/05/2015 FINDINGS: Alignment: Mild anterolisthesis C2-3. Remaining alignment normal. Mild straightening of cervical lordosis Vertebrae: Negative for fracture or mass.  Normal bone marrow. Cord: Normal signal and morphology Posterior Fossa, vertebral arteries, paraspinal tissues: Negative Disc levels: C2-3:  Negative C3-4:  Negative C4-5:  Negative C5-6: Mild disc degeneration and uncinate  spurring. No significant spinal stenosis. Minimal foraminal narrowing bilaterally. C6-7: Central and right foraminal disc protrusion.  There is significant right foraminal encroachment with impingement of the right C7 nerve root due to disc protrusion and spurring. This was present previously and may have progressed in the interval. Mild spinal stenosis without cord deformity C7-T1:  Negative IMPRESSION: Right foraminal disc protrusion and spurring at C6-7 causing right foraminal encroachment and impingement of the right C7 nerve root. Right foraminal encroachment appears to have progressed since the prior study. Mild spinal stenosis C6-7 Mild degenerative change at C5-6 without stenosis. Electronically Signed   By: Franchot Gallo M.D.   On: 09/13/2016 15:03   Cervical Imaging: Cervical MR wo contrast:  Results for orders placed during the hospital encounter of 09/13/16  MR CERVICAL SPINE WO CONTRAST   Narrative CLINICAL DATA:  Neck pain.  Right arm pain.  Cervical radiculopathy.  EXAM: MRI CERVICAL SPINE WITHOUT CONTRAST  TECHNIQUE: Multiplanar, multisequence MR imaging of the cervical spine was performed. No intravenous contrast was administered.  COMPARISON:  Cervical radiographs 08/30/2016. Outside cervical MRI 10/05/2015  FINDINGS: Alignment: Mild anterolisthesis C2-3. Remaining alignment normal. Mild straightening of cervical lordosis  Vertebrae: Negative for fracture or mass.  Normal bone marrow.  Cord: Normal signal and morphology  Posterior Fossa, vertebral arteries, paraspinal tissues: Negative  Disc levels:  C2-3:  Negative  C3-4:  Negative  C4-5:  Negative  C5-6: Mild disc degeneration and uncinate spurring. No significant spinal stenosis. Minimal foraminal narrowing bilaterally.  C6-7: Central and right foraminal disc protrusion. There is significant right foraminal encroachment with impingement of the right C7 nerve root due to disc protrusion and spurring. This  was present previously and may have progressed in the interval. Mild spinal stenosis without cord deformity  C7-T1:  Negative  IMPRESSION: Right foraminal disc protrusion and spurring at C6-7 causing right foraminal encroachment and impingement of the right C7 nerve root. Right foraminal encroachment appears to have progressed since the prior study. Mild spinal stenosis C6-7  Mild degenerative change at C5-6 without stenosis.   Electronically Signed   By: Franchot Gallo M.D.   On: 09/13/2016 15:03    Cervical DG complete:  Results for orders placed during the hospital encounter of 08/30/16  DG Cervical Spine Complete   Narrative CLINICAL DATA:  Chronic neck pain, no known injury, initial encounter  EXAM: CERVICAL SPINE - COMPLETE 4+ VIEW  COMPARISON:  09/25/2015  FINDINGS: Seven cervical segments are well visualized. Vertebral body height is well maintained. Mild osteophytic changes are noted. Facet hypertrophic changes are seen as well. Neural foramina appear patent as visualized. No acute fracture or acute facet abnormality is noted. No soft tissue changes are seen.  IMPRESSION: Degenerative change without acute abnormality.   Electronically Signed   By: Inez Catalina M.D.   On: 08/30/2016 13:46    Shoulder Imaging: Shoulder-R DG:  Results for orders placed during the hospital encounter of 08/30/16  DG Shoulder Right   Narrative CLINICAL DATA:  Chronic right shoulder pain, no known injury, initial encounter  EXAM: RIGHT SHOULDER - 2+ VIEW  COMPARISON:  None.  FINDINGS: Postsurgical changes consistent with rotator cuff repair are noted in the right proximal humerus. No acute fracture or dislocation is seen. No soft tissue abnormality is noted. The underlying bony thorax is within normal limits.  IMPRESSION: No acute abnormality noted.   Electronically Signed   By: Inez Catalina M.D.   On: 08/30/2016 13:55    Shoulder-L DG:  Results for orders  placed during the hospital encounter of 08/30/16  DG Shoulder Left  Narrative CLINICAL DATA:  Chronic left shoulder pain common no known injury, initial encounter  EXAM: LEFT SHOULDER - 2+ VIEW  COMPARISON:  None.  FINDINGS: Minimal degenerative changes of the acromioclavicular joint are seen. No acute fracture or dislocation is noted. The underlying bony thorax is within normal limits.  IMPRESSION: No acute abnormality noted.   Electronically Signed   By: Inez Catalina M.D.   On: 08/30/2016 13:55    Thoracic Imaging: Thoracic DG 2-3 views:  Results for orders placed during the hospital encounter of 08/30/16  DG Thoracic Spine 2 View   Narrative CLINICAL DATA:  Chronic upper back pain, no known injury, initial encounter  EXAM: THORACIC SPINE 2 VIEWS  COMPARISON:  None.  FINDINGS: Vertebral body height is well maintained. Mild osteophytic changes are noted. No paraspinal mass lesion is seen. No rib abnormality is noted.  IMPRESSION: No acute abnormality noted.   Electronically Signed   By: Inez Catalina M.D.   On: 08/30/2016 13:49    Lumbosacral Imaging: Lumbar MR wo contrast:  Results for orders placed during the hospital encounter of 02/24/16  MR Lumbar Spine Wo Contrast   Narrative CLINICAL DATA:  Low back pain. RIGHT leg pain, numbness and tingling. EXAM: MRI LUMBAR SPINE WITHOUT CONTRAST TECHNIQUE: Multiplanar, multisequence MR imaging of the lumbar spine was performed. No intravenous contrast was administered. COMPARISON:  None. FINDINGS: Segmentation:  Standard. Alignment:  Physiologic. Vertebrae:  No fracture, evidence of discitis, or bone lesion. Conus medullaris: Extends to the L1 level and appears normal. 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 No mass or hydronephrosis. Disc levels: L1-L2:  Normal L2-L3:  Normal L3-L4:  Normal L4-L5: Central annular bulge. Mild facet arthropathy. Annular  tears are seen in both foramina, with subligamentous protrusions on the RIGHT and LEFT. In conjunction with facet hypertrophy, there is foraminal narrowing bilaterally which could affect either the RIGHT or LEFT L4 nerve root. L5-S1: Annular bulge. Mild facet arthropathy. No significant subarticular zone or foraminal zone narrowing. IMPRESSION: Potentially symptomatic annular tears with subligamentous protrusions at L4-5 bilaterally. Either L4 nerve root could be irritated. Correlate clinically as a cause of RIGHT leg pain. Electronically Signed   By: Staci Righter M.D.   On: 02/24/2016 19:55   Lumbar DG Bending views:  Results for orders placed during the hospital encounter of 08/30/16  DG Lumbar Spine Complete W/Bend   Narrative CLINICAL DATA:  Low back pain  EXAM: LUMBAR SPINE - COMPLETE WITH BENDING VIEWS  COMPARISON:  02/24/2016  FINDINGS: Five lumbar type vertebral bodies are well visualized. Vertebral body height is well maintained. Mild facet hypertrophic changes are noted bilaterally at all lumbar levels. No pars defects are seen. No anterolisthesis is noted. There is minimal anterior motion of L4 with respect L5 of approximately 3 mm on flexion. This is likely of a degenerative nature. No other focal abnormality is seen. No ligamentous calcifications are seen. Very mild osteophytes are noted at the L2-3 and L3-4 levels.  IMPRESSION: Minimal anterolisthesis of L4 on L5 on flexion. No pars defects are seen. Minimal osteophytic changes are noted.   Electronically Signed   By: Inez Catalina M.D.   On: 08/30/2016 13:52    Spine Imaging: Spine Outside MR Films:  Results for orders placed in visit on 11/19/15  MR [place]   DG Spine outside:  Results for orders placed in visit on 11/17/15  DG Outside Films Spine   Knee Imaging: Knee-R DG 1-2 views:  Results for orders placed during the hospital encounter of 08/30/16  DG Knee 1-2 Views Right    Narrative CLINICAL DATA:  Chronic right knee pain, initial encounter  EXAM: RIGHT KNEE - 1-2 VIEW  COMPARISON:  None.  FINDINGS: Mild degenerative changes are noted in the medial joint space. No acute fracture or acute dislocation is noted. No soft tissue changes are seen.  IMPRESSION: Mild medial joint space narrowing.  No acute abnormality noted.   Electronically Signed   By: Inez Catalina M.D.   On: 08/30/2016 13:48    Note: Results of ordered imaging test(s) reviewed and explained to patient in Layman's terms. Copy of results provided to patient  Meds  The patient has a current medication list which includes the following prescription(s): albuterol, budesonide-formoterol, clonazepam, cyclobenzaprine, fluticasone, ipratropium-albuterol, lamotrigine, levetiracetam, lurasidone hcl, pantoprazole, pregabalin, ranitidine, sildenafil, calcium plus d3 absorbable, cholecalciferol, ergocalciferol, magnesium oxide, and oxycodone.  Current Outpatient Prescriptions on File Prior to Visit  Medication Sig  . albuterol (PROVENTIL HFA;VENTOLIN HFA) 108 (90 Base) MCG/ACT inhaler Inhale 2 puffs into the lungs every 6 (six) hours as needed for wheezing or shortness of breath.  . budesonide-formoterol (SYMBICORT) 80-4.5 MCG/ACT inhaler Inhale 2 puffs into the lungs 2 (two) times daily.  . clonazePAM (KLONOPIN) 1 MG tablet Take 1 mg by mouth 3 (three) times daily as needed.   Marland Kitchen ipratropium-albuterol (DUONEB) 0.5-2.5 (3) MG/3ML SOLN Take 3 mLs by nebulization 2 (two) times daily.  Marland Kitchen lamoTRIgine (LAMICTAL) 25 MG tablet Take 1 tablet twice a day for 2 weeks, then increase to 2 tablets twice a day  . levETIRAcetam (KEPPRA) 500 MG tablet Take 1 tablet (500 mg total) by mouth 2 (two) times daily.  . Lurasidone HCl 60 MG TABS Take 80 mg by mouth at bedtime.   . pantoprazole (PROTONIX) 40 MG tablet Take 1 tablet (40 mg total) by mouth 2 (two) times daily before a meal.  . ranitidine (ZANTAC) 300 MG tablet  Take 300 mg by mouth at bedtime.  . sildenafil (VIAGRA) 100 MG tablet Take 100 mg by mouth daily as needed for erectile dysfunction.   No current facility-administered medications on file prior to visit.    ROS  Constitutional: Denies any fever or chills Gastrointestinal: No reported hemesis, hematochezia, vomiting, or acute GI distress Musculoskeletal: Denies any acute onset joint swelling, redness, loss of ROM, or weakness Neurological: No reported episodes of acute onset apraxia, aphasia, dysarthria, agnosia, amnesia, paralysis, loss of coordination, or loss of consciousness  Allergies  Patrick Gregory has No Known Allergies.  [place]  Drug: Mr. Fiveash  reports that he does not use drugs. Alcohol:  reports that he drinks alcohol. Tobacco:  reports that he quit smoking about 2 years ago. His smoking use included Cigarettes. He has a 35.00 pack-year smoking history. He has never used smokeless tobacco. Medical:  has a past medical history of Allergic rhinitis; Arthritis; Asthma; Bipolar disorder ([place]); Chickenpox; COPD (chronic obstructive pulmonary disease) ([place]); Depression; Low back pain; Seizures ([place]); Sleep apnea; Stiff neck; and Throat cancer ([place]). Family: family history includes Bipolar disorder in his brother and mother; Depression in his brother and mother; Heart disease in his brother and brother.  Past Surgical History:  Procedure Laterality Date  . DIRECT LARYNGOSCOPY Right 10/23/2015   Procedure: MICO DIRECT LARYNGOSCOPY WITH EXCISION RIGHT VOCAL CORD LESION;  Surgeon: Beverly Gust, MD;  Location: [facility];  Service: ENT;  Laterality: Right;  . ROTATOR CUFF REPAIR Bilateral 2005, 2013  . THROAT  SURGERY     Constitutional Exam  General appearance: Well nourished, well developed, and well hydrated. In no apparent acute distress Vitals:   12/13/16 0848  BP: 124/82  Pulse: 82  Resp: 18  Temp: 98 F (36.7 C)  TempSrc: Oral  SpO2: 100%  Weight: 200 lb (90.7 kg)   Height: 5' 9" (1.753 m)   BMI Assessment: Estimated body mass index is 29.53 kg/m as calculated from the following:   Height as of this encounter: 5' 9" (1.753 m).   Weight as of this encounter: 200 lb (90.7 kg).  BMI interpretation table: BMI level Category Range association with higher incidence of chronic pain  <18 kg/m2 Underweight   18.5-24.9 kg/m2 Ideal body weight   25-29.9 kg/m2 Overweight Increased incidence by 20%  30-34.9 kg/m2 Obese (Class I) Increased incidence by 68%  35-39.9 kg/m2 Severe obesity (Class II) Increased incidence by 136%  >40 kg/m2 Extreme obesity (Class III) Increased incidence by 254%   BMI Readings from Last 4 Encounters:  12/13/16 29.53 kg/m  12/02/16 30.42 kg/m  10/05/16 30.33 kg/m  09/23/16 29.83 kg/m   Wt Readings from Last 4 Encounters:  12/13/16 200 lb (90.7 kg)  12/02/16 206 lb (93.4 kg)  10/05/16 205 lb 6.4 oz (93.2 kg)  09/23/16 202 lb (91.6 kg)  Psych/Mental status: Alert, oriented x 3 (person, place, & time)       Eyes: PERLA Respiratory: No evidence of acute respiratory distress  Cervical Spine Exam  Inspection: No masses, redness, or swelling Alignment: Symmetrical Functional ROM: Decreased ROM, to the right Stability: No instability detected Muscle strength & Tone: Functionally intact Sensory: Movement-associated pain Palpation: Tender              Upper Extremity (UE) Exam    Side: Right upper extremity  Side: Left upper extremity  Inspection: No masses, redness, swelling, or asymmetry. No contractures  Inspection: No masses, redness, swelling, or asymmetry. No contractures  Functional ROM: Unrestricted ROM          Functional ROM: Unrestricted ROM          Muscle strength & Tone: Functionally intact  Muscle strength & Tone: Functionally intact  Sensory: Unimpaired  Sensory: Unimpaired  Palpation: No palpable anomalies              Palpation: No palpable anomalies              Specialized Test(s): Deferred          Specialized Test(s): Deferred          Thoracic Spine Exam  Inspection: No masses, redness, or swelling Alignment: Symmetrical Functional ROM: Unrestricted ROM Stability: No instability detected Sensory: Unimpaired Muscle strength & Tone: No palpable anomalies  Lumbar Spine Exam  Inspection: No masses, redness, or swelling Alignment: Symmetrical Functional ROM: Decreased ROM      Stability: No instability detected Muscle strength & Tone: Functionally intact Sensory: Movement-associated pain Palpation: Complains of area being tender to palpation       Provocative Tests: Lumbar Hyperextension and rotation test: evaluation deferred today       Patrick's Maneuver: evaluation deferred today                    Gait & Posture Assessment  Ambulation: Unassisted Gait: Relatively normal for age and body habitus Posture: WNL   Lower Extremity Exam    Side: Right lower extremity  Side: Left lower extremity  Inspection: No masses, redness, swelling, or asymmetry. No  contractures  Inspection: No masses, redness, swelling, or asymmetry. No contractures  Functional ROM: Unrestricted ROM          Functional ROM: Unrestricted ROM          Muscle strength & Tone: Functionally intact  Muscle strength & Tone: Functionally intact  Sensory: Unimpaired  Sensory: Unimpaired  Palpation: No palpable anomalies  Palpation: No palpable anomalies   Assessment & Plan  Primary Diagnosis & Pertinent Problem List: The primary encounter diagnosis was Chronic neck pain (Location of Primary Source of Pain) (Bilateral) (R>L). Diagnoses of Chronic cervical radicular pain (Right) (intermittent), Chronic upper back pain (Location of Secondary source of pain) (Bilateral) (L>R), Chronic low back pain (Location of Tertiary source of pain) (Bilateral) (R>L), Chronic pain syndrome, Muscle cramps, Musculoskeletal pain, Neurogenic pain, Vitamin D deficiency, Gastroesophageal reflux disease without esophagitis, Long term  prescription opiate use, and Opiate use (20 MME/Day) were also pertinent to this visit.  Visit Diagnosis: 1. Chronic neck pain (Location of Primary Source of Pain) (Bilateral) (R>L)   2. Chronic cervical radicular pain (Right) (intermittent)   3. Chronic upper back pain (Location of Secondary source of pain) (Bilateral) (L>R)   4. Chronic low back pain (Location of Tertiary source of pain) (Bilateral) (R>L)   5. Chronic pain syndrome   6. Muscle cramps   7. Musculoskeletal pain   8. Neurogenic pain   9. Vitamin D deficiency   10. Gastroesophageal reflux disease without esophagitis   11. Long term prescription opiate use   12. Opiate use (20 MME/Day)    Problems updated and reviewed during this visit: Problem  Musculoskeletal Pain  Neuropathic Pain  Opiate use (20 MME/Day)   Problem-specific Plan(s): No problem-specific Assessment & Plan notes found for this encounter.  Assessment & plan notes cannot be loaded without a specified hospital service.  Plan of Care  Pharmacotherapy (Medications Ordered): Meds ordered this encounter  Medications  . ergocalciferol (VITAMIN D2) 50000 units capsule    Sig: Take 1 capsule (50,000 Units total) by mouth 2 (two) times a week. X 6 weeks.    Dispense:  12 capsule    Refill:  0    Do not add this medication to the electronic "Automatic Refill" notification system. Patient may have prescription filled one day early if pharmacy is closed on scheduled refill date.  . Cholecalciferol 5000 units capsule    Sig: Take 1 capsule (5,000 Units total) by mouth daily.    Dispense:  90 capsule    Refill:  0    Do not place medication on "Automatic Refill". Fill one day early if pharmacy is closed on scheduled refill date.  . Calcium Carb-Cholecalciferol (CALCIUM PLUS D3 ABSORBABLE) [phone] MG-UNIT CAPS    Sig: Take 1 capsule by mouth daily with breakfast.    Dispense:  90 capsule    Refill:  0    Do not place medication on "Automatic Refill". Fill  one day early if pharmacy is closed on scheduled refill date.  . Magnesium Oxide 500 MG CAPS    Sig: Take 1 capsule (500 mg total) by mouth 2 (two) times daily at 8 am and 10 pm.    Dispense:  90 capsule    Refill:  0    Do not place medication on "Automatic Refill". Fill one day early if pharmacy is closed on scheduled refill date.  Marland Kitchen DISCONTD: oxyCODONE-acetaminophen (PERCOCET) 5-325 MG tablet    Sig: Take 1 tablet by mouth every 8 (eight) hours as needed  for severe pain.    Dispense:  90 tablet    Refill:  0    Do not place this medication, or any other prescription from our practice, on "Automatic Refill". Patient may have prescription filled one day early if pharmacy is closed on scheduled refill date. Do not fill until: 12/13/16 To last until: 01/12/17  . oxyCODONE (OXY IR/ROXICODONE) 5 MG immediate release tablet    Sig: Take 1 tablet (5 mg total) by mouth every 8 (eight) hours as needed for severe pain.    Dispense:  90 tablet    Refill:  0    Do not place this medication, or any other prescription from our practice, on "Automatic Refill". Patient may have prescription filled one day early if pharmacy is closed on scheduled refill date. Do not fill until: 12/13/16 To last until: 01/12/17  . pregabalin (LYRICA) 75 MG capsule    Sig: Take 1 capsule (75 mg total) by mouth 2 (two) times daily.    Dispense:  60 capsule    Refill:  0    Do not place medication on "Automatic Refill". Fill one day early if pharmacy is closed on scheduled refill date.  . cyclobenzaprine (FLEXERIL) 10 MG tablet    Sig: Take 1 tablet (10 mg total) by mouth 3 (three) times daily.    Dispense:  90 tablet    Refill:  0    Do not place medication on "Automatic Refill". Fill one day early if pharmacy is closed on scheduled refill date.   Lab-work, procedure(s), and/or referral(s): Orders Placed This Encounter  Procedures  . Cervical Epidural Injection  . ToxASSURE Select 13 (MW), Urine     Pharmacotherapy: Opioid Analgesics: We'll take over management today. See above orders Membrane stabilizer: We have discussed the possibility of optimizing this mode of therapy, if tolerated Muscle relaxant: We have discussed the possibility of a trial NSAID: We have discussed the possibility of a trial Other analgesic(s): To be determined at a later time   Interventional therapies: Planned, scheduled, and/or pending:    Diagnostic right-sided cervical epidural steroid injection under fluoroscopic guidance    Considering:   Diagnostic right-sided cervical epidural steroid injection under fluoroscopic guidance  Diagnostic bilateral cervical facet block under fluoroscopic guidance  Possible bilateral cervical facet radiofrequency ablation  Bilateral intra-articular shoulder joint injection under fluoroscopic guidance  Diagnostic bilateral suprascapular nerve block under fluoroscopic guidance  Possible bilateral suprascapular nerve radiofrequency ablation  Diagnostic bilateral lumbar facet block under fluoroscopic guidance  Possible bilateral lumbar facet radiofrequency ablation  Diagnostic right-sided intra-articular knee joint injection with local anesthetic and steroid  Diagnostic right-sided genicular nerve blocks under fluoroscopic guidance  Possible right-sided genicular nerve radiofrequency ablation    PRN Procedures:   To be determined at a later time   Provider-requested follow-up: Return in about 1 month (around 01/13/2017) for Med-Mgmt, w/ MD, in addition, procedure (w/ sedation):, (ASAP), by MD.  Future Appointments Date Time Provider [place]  12/28/2016 7:45 AM Milinda Pointer, MD [facility] None  01/12/2017 2:00 PM Milinda Pointer, MD [facility] None  02/03/2017 1:00 PM Cameron Sprang, MD [facility] None    Primary Care Physician: Leone Haven, MD Location: [facility] Note by: Kathlen Brunswick. Dossie Arbour, M.D, DABA, DABAPM,  DABPM, DABIPP, FIPP Date: 12/13/2016; Time: 4:24 PM  Patient instructions provided during this appointment: Patient Instructions    Pre procedure instructions given with teach back 3 done.  You have been given a script for oxycodone  and Lyrica today.    Pain Score  Introduction: The pain score used by this practice is the Verbal Numerical Rating Scale (VNRS-11). This is an 11-point scale. It is for adults and children 10 years or older. There are significant differences in how the pain score is reported, used, and applied. Forget everything you learned in the past and learn this scoring system.  General Information: The scale should reflect your current level of pain. Unless you are specifically asked for the level of your worst pain, or your average pain. If you are asked for one of these two, then it should be understood that it is over the past 24 hours.  Basic Activities of Daily Living (ADL): Personal hygiene, dressing, eating, transferring, and using restroom.  Instructions: Most patients tend to report their level of pain as a combination of two factors, their physical pain and their psychosocial pain. This last one is also known as "suffering" and it is reflection of how physical pain affects you socially and psychologically. From now on, report them separately. From this point on, when asked to report your pain level, report only your physical pain. Use the following table for reference.  Pain Clinic Pain Levels (0-5/10)  Pain Level Score Description  No Pain 0   Mild pain 1 Nagging, annoying, but does not interfere with basic activities of daily living (ADL). Patients are able to eat, bathe, get dressed, toileting (being able to get on and off the toilet and perform personal hygiene functions), transfer (move in and out of bed or a chair without assistance), and maintain continence (able to control bladder and bowel functions). Blood pressure and heart rate are unaffected. A normal  heart rate for a healthy adult ranges from 60 to 100 bpm (beats per minute).   Mild to moderate pain 2 Noticeable and distracting. Impossible to hide from other people. More frequent flare-ups. Still possible to adapt and function close to normal. It can be very annoying and may have occasional stronger flare-ups. With discipline, patients may get used to it and adapt.   Moderate pain 3 Interferes significantly with activities of daily living (ADL). It becomes difficult to feed, bathe, get dressed, get on and off the toilet or to perform personal hygiene functions. Difficult to get in and out of bed or a chair without assistance. Very distracting. With effort, it can be ignored when deeply involved in activities.   Moderately severe pain 4 Impossible to ignore for more than a few minutes. With effort, patients may still be able to manage work or participate in some social activities. Very difficult to concentrate. Signs of autonomic nervous system discharge are evident: dilated pupils (mydriasis); mild sweating (diaphoresis); sleep interference. Heart rate becomes elevated (>115 bpm). Diastolic blood pressure (lower number) rises above 100 mmHg. Patients find relief in laying down and not moving.   Severe pain 5 Intense and extremely unpleasant. Associated with frowning face and frequent crying. Pain overwhelms the senses.  Ability to do any activity or maintain social relationships becomes significantly limited. Conversation becomes difficult. Pacing back and forth is common, as getting into a comfortable position is nearly impossible. Pain wakes you up from deep sleep. Physical signs will be obvious: pupillary dilation; increased sweating; goosebumps; brisk reflexes; cold, clammy hands and feet; nausea, vomiting or dry heaves; loss of appetite; significant sleep disturbance with inability to fall asleep or to remain asleep. When persistent, significant weight loss is observed due to the complete loss of  appetite and  sleep deprivation.  Blood pressure and heart rate becomes significantly elevated. Caution: If elevated blood pressure triggers a pounding headache associated with blurred vision, then the patient should immediately seek attention at an urgent or emergency care unit, as these may be signs of an impending stroke.    Emergency Department Pain Levels (6-10/10)  Emergency Room Pain 6 Severely limiting. Requires emergency care and should not be seen or managed at an outpatient pain management facility. Communication becomes difficult and requires great effort. Assistance to reach the emergency department may be required. Facial flushing and profuse sweating along with potentially dangerous increases in heart rate and blood pressure will be evident.   Distressing pain 7 Self-care is very difficult. Assistance is required to transport, or use restroom. Assistance to reach the emergency department will be required. Tasks requiring coordination, such as bathing and getting dressed become very difficult.   Disabling pain 8 Self-care is no longer possible. At this level, pain is disabling. The individual is unable to do even the most "basic" activities such as walking, eating, bathing, dressing, transferring to a bed, or toileting. Fine motor skills are lost. It is difficult to think clearly.   Incapacitating pain 9 Pain becomes incapacitating. Thought processing is no longer possible. Difficult to remember your own name. Control of movement and coordination are lost.   The worst pain imaginable 10 At this level, most patients pass out from pain. When this level is reached, collapse of the autonomic nervous system occurs, leading to a sudden drop in blood pressure and heart rate. This in turn results in a temporary and dramatic drop in blood flow to the brain, leading to a loss of consciousness. Fainting is one of the body's self defense mechanisms. Passing out puts the brain in a calmed state and  causes it to shut down for a while, in order to begin the healing process.    Summary: 1. Refer to this scale when providing [place] with your pain level. 2. Be accurate and careful when reporting your pain level. This will help with your care. 3. Over-reporting your pain level will lead to loss of credibility. 4. Even a level of 1/10 means that there is pain and will be treated at our facility. 5. High, inaccurate reporting will be documented as "Symptom Exaggeration", leading to loss of credibility and suspicions of possible secondary gains such as obtaining more narcotics, or wanting to appear disabled, for fraudulent reasons. 6. Only pain levels of 5 or below will be seen at our facility. 7. Pain levels of 6 and above will be sent to the Emergency Department and the appointment cancelled. _____________________________________________________________________________________________  ____________________________________________________________________________________________  Medication Rules  Applies to: All patients receiving prescriptions (written or electronic).  Pharmacy of record: Pharmacy where electronic prescriptions will be sent. If written prescriptions are taken to a different pharmacy, please inform the nursing staff. The pharmacy listed in the electronic medical record should be the one where you would like electronic prescriptions to be sent.  Prescription refills: Only during scheduled appointments. Applies to both, written and electronic prescriptions.  NOTE: The following applies primarily to controlled substances (Opioid Pain Medications)  Patient's responsibilities: 1. Pain Pills: Bring all pain pills to every appointment (except for procedure appointments). 2. Pill Bottles: Bring pills in original pharmacy bottle. Always bring newest bottle. Bring bottle, even if empty. 3. Medication refills: You are responsible for knowing and keeping track of what medications you need  refilled. The day before your appointment, write a list  of all prescriptions that need to be refilled. Bring that list to your appointment and give it to the admitting nurse. Prescriptions will be written only during appointments. If you forget a medication, it will not be "Called in", "Faxed", or "electronically sent". You will need to get another appointment to get these prescribed. 4. Prescription Accuracy: You are responsible for carefully inspecting your prescriptions before leaving our office. Have the discharge nurse carefully go over each prescription with you, before taking them home. Make sure that your name is accurately spelled, that your address is correct. Check the name and dose of your medication to make sure it is accurate. Check the number of pills, and the written instructions to make sure they are clear and accurate. Make sure that you are given enough medication to last until your next medication refill appointment. 5. Taking Medication: Take medication as prescribed. Never take more pills than instructed. Never take medication more frequently than prescribed. Taking less pills or less frequently is permitted and encouraged, when it comes to controlled substances (written prescriptions).  6. Inform other Doctors: Always inform, all of your healthcare providers, of all the medications you take. 7. Pain Medication from other Providers: You are not allowed to accept any additional pain medication from any other Doctor or Healthcare provider. There are two exceptions to this rule. (see below) In the event that you require additional pain medication, you are responsible for notifying us, as stated below. 8. Medication Agreement: You are responsible for carefully reading and following our Medication Agreement. This must be signed before receiving any prescriptions from our practice. Safely store a copy of your signed Agreement. Violations to the Agreement will result in no further prescriptions.  (Additional copies of our Medication Agreement are available upon request.) 9. Laws, Rules, & Regulations: All patients are expected to follow all Federal and [employer], Rules, [employer]. Ignorance of the Laws does not constitute a valid excuse.  Exceptions: There are only two exceptions to the rule of not receiving pain medications from other Healthcare Providers. 1. Exception #1 (Emergencies): In the event of an emergency (i.e.: accident requiring emergency care), you are allowed to receive additional pain medication. However, you are responsible for: As soon as you are able, call our office [phone], at any time of the day or night, and leave a message stating your name, the date and nature of the emergency, and the name and dose of the medication prescribed. In the event that your call is answered by a member of our staff, make sure to document and save the date, time, and the name of the person that took your information.  2. Exception #2 (Planned Surgery): In the event that you are scheduled by another doctor or dentist to have any type of surgery or procedure, you are allowed (for a period no longer than 30 days), to receive additional pain medication, for the acute post-op pain. However, in this case, you are responsible for picking up a copy of our "Post-op Pain Management for Surgeons" handout, and giving it to your surgeon or dentist. This document is available at our office, and does not require an appointment to obtain it. Simply go to our office during business hours (Monday-Thursday from 8:00 AM to 4:00 PM) (Friday 8:00 AM to 12:00 Noon) or if you have a scheduled appointment with [place], prior to your surgery, and ask for it by name. In addition, you will need to provide [place] with your name, name of  your surgeon, type of surgery, and date of procedure or  surgery.  _____________________________________________________________________________________________  _______________________________________________________________  Preparing for Procedure with Sedation Instructions: . Oral Intake: Do not eat or drink anything for at least 8 hours prior to your procedure. . Transportation: Public transportation is not allowed. Bring an adult driver. The driver must be physically present in our waiting room before any procedure can be started. Marland Kitchen Physical Assistance: Bring an adult physically capable of assisting you, in the event you need help. This adult should keep you company at home for at least 6 hours after the procedure. . Blood Pressure Medicine: Take your blood pressure medicine with a sip of water the morning of the procedure. . Blood thinners:  . Diabetics on insulin: Notify the staff so that you can be scheduled 1st case in the morning. If your diabetes requires high dose insulin, take only  of your normal insulin dose the morning of the procedure and notify the staff that you have done so. . Preventing infections: Shower with an antibacterial soap the morning of your procedure. . Build-up your immune system: Take 1000 mg of Vitamin C with every meal (3 times a day) the day prior to your procedure. Marland Kitchen Antibiotics: Inform the staff if you have a condition or reason that requires you to take antibiotics before dental procedures. . Pregnancy: If you are pregnant, call and cancel the procedure. . Sickness: If you have a cold, fever, or any active infections, call and cancel the procedure. . Arrival: You must be in the facility at least 30 minutes prior to your scheduled procedure. . Children: Do not bring children with you. . Dress appropriately: Bring dark clothing that you would not mind if they get stained. . Valuables: Do not bring any jewelry or valuables. Procedure appointments are reserved for interventional treatments only. Marland Kitchen No  Prescription Refills. . No medication changes will be discussed during procedure appointments. . No disability issues will be discussed. ______________________________________________________________________________________________  _______________________________________________________________  Preparing for Procedure with Sedation Instructions: . Oral Intake: Do not eat or drink anything for at least 8 hours prior to your procedure. . Transportation: Public transportation is not allowed. Bring an adult driver. The driver must be physically present in our waiting room before any procedure can be started. Marland Kitchen Physical Assistance: Bring an adult physically capable of assisting you, in the event you need help. This adult should keep you company at home for at least 6 hours after the procedure. . Blood Pressure Medicine: Take your blood pressure medicine with a sip of water the morning of the procedure. . Blood thinners:  . Diabetics on insulin: Notify the staff so that you can be scheduled 1st case in the morning. If your diabetes requires high dose insulin, take only  of your normal insulin dose the morning of the procedure and notify the staff that you have done so. . Preventing infections: Shower with an antibacterial soap the morning of your procedure. . Build-up your immune system: Take 1000 mg of Vitamin C with every meal (3 times a day) the day prior to your procedure. Marland Kitchen Antibiotics: Inform the staff if you have a condition or reason that requires you to take antibiotics before dental procedures. . Pregnancy: If you are pregnant, call and cancel the procedure. . Sickness: If you have a cold, fever, or any active infections, call and cancel the procedure. . Arrival: You must be in the facility at least 30 minutes prior to your scheduled procedure. . Children: Do  not bring children with you. . Dress appropriately: Bring dark clothing that you would not mind if they get stained. . Valuables:  Do not bring any jewelry or valuables. Procedure appointments are reserved for interventional treatments only. Marland Kitchen No Prescription Refills. . No medication changes will be discussed during procedure appointments. . No disability issues will be discussed. ______________________________________________________________________________________________  Epidural Steroid Injection Patient Information  Description: The epidural space surrounds the nerves as they exit the spinal cord.  In some patients, the nerves can be compressed and inflamed by a bulging disc or a tight spinal canal (spinal stenosis).  By injecting steroids into the epidural space, we can bring irritated nerves into direct contact with a potentially helpful medication.  These steroids act directly on the irritated nerves and can reduce swelling and inflammation which often leads to decreased pain.  Epidural steroids may be injected anywhere along the spine and from the neck to the low back depending upon the location of your pain.   After numbing the skin with local anesthetic (like Novocaine), a small needle is passed into the epidural space slowly.  You may experience a sensation of pressure while this is being done.  The entire block usually last less than 10 minutes.  Conditions which may be treated by epidural steroids:   Low back and leg pain  Neck and arm pain  Spinal stenosis  Post-laminectomy syndrome  Herpes zoster (shingles) pain  Pain from compression fractures  Preparation for the injection:  3. Do not eat any solid food or dairy products within 8 hours of your appointment.  4. You may drink clear liquids up to 3 hours before appointment.  Clear liquids include water, black coffee, juice or soda.  No milk or cream please. 5. You may take your regular medication, including pain medications, with a sip of water before your appointment  Diabetics should hold regular insulin (if taken separately) and take 1/2 normal  NPH dos the morning of the procedure.  Carry some sugar containing items with you to your appointment. 6. A driver must accompany you and be prepared to drive you home after your procedure.  7. Bring all your current medications with your. 8. An IV may be inserted and sedation may be given at the discretion of the physician.   9. A blood pressure cuff, EKG and other monitors will often be applied during the procedure.  Some patients may need to have extra oxygen administered for a short period. 10. You will be asked to provide medical information, including your allergies, prior to the procedure.  We must know immediately if you are taking blood thinners (like Coumadin/Warfarin)  Or if you are allergic to IV iodine contrast (dye). We must know if you could possible be pregnant.  Possible side-effects:  Bleeding from needle site  Infection (rare, may require surgery)  Nerve injury (rare)  Numbness & tingling (temporary)  Difficulty urinating (rare, temporary)  Spinal headache ( a headache worse with upright posture)  Light -headedness (temporary)  Pain at injection site (several days)  Decreased blood pressure (temporary)  Weakness in arm/leg (temporary)  Pressure sensation in back/neck (temporary)  Call if you experience:  Fever/chills associated with headache or increased back/neck pain.  Headache worsened by an upright position.  New onset weakness or numbness of an extremity below the injection site  Hives or difficulty breathing (go to the emergency room)  Inflammation or drainage at the infection site  Severe back/neck pain  Any new symptoms which  are concerning to you  Please note:  Although the local anesthetic injected can often make your back or neck feel good for several hours after the injection, the pain will likely return.  It takes 3-7 days for steroids to work in the epidural space.  You may not notice any pain relief for at least that one week.  If  effective, we will often do a series of three injections spaced 3-6 weeks apart to maximally decrease your pain.  After the initial series, we generally will wait several months before considering a repeat injection of the same type.  If you have any questions, please call [phone] [facility]

## 2016-12-13 ENCOUNTER — Ambulatory Visit: Payer: Medicare Other | Admitting: Pain Medicine

## 2016-12-13 ENCOUNTER — Ambulatory Visit: Payer: Medicare Other | Attending: Pain Medicine | Admitting: Pain Medicine

## 2016-12-13 ENCOUNTER — Encounter: Payer: Self-pay | Admitting: Pain Medicine

## 2016-12-13 VITALS — BP 124/82 | HR 82 | Temp 98.0°F | Resp 18 | Ht 69.0 in | Wt 200.0 lb

## 2016-12-13 DIAGNOSIS — M542 Cervicalgia: Secondary | ICD-10-CM | POA: Diagnosis not present

## 2016-12-13 DIAGNOSIS — K219 Gastro-esophageal reflux disease without esophagitis: Secondary | ICD-10-CM | POA: Diagnosis not present

## 2016-12-13 DIAGNOSIS — M792 Neuralgia and neuritis, unspecified: Secondary | ICD-10-CM

## 2016-12-13 DIAGNOSIS — Z79899 Other long term (current) drug therapy: Secondary | ICD-10-CM | POA: Diagnosis not present

## 2016-12-13 DIAGNOSIS — J441 Chronic obstructive pulmonary disease with (acute) exacerbation: Secondary | ICD-10-CM | POA: Insufficient documentation

## 2016-12-13 DIAGNOSIS — M25512 Pain in left shoulder: Secondary | ICD-10-CM | POA: Diagnosis not present

## 2016-12-13 DIAGNOSIS — M546 Pain in thoracic spine: Secondary | ICD-10-CM | POA: Insufficient documentation

## 2016-12-13 DIAGNOSIS — M4722 Other spondylosis with radiculopathy, cervical region: Secondary | ICD-10-CM | POA: Insufficient documentation

## 2016-12-13 DIAGNOSIS — M545 Low back pain: Secondary | ICD-10-CM

## 2016-12-13 DIAGNOSIS — R7982 Elevated C-reactive protein (CRP): Secondary | ICD-10-CM | POA: Diagnosis not present

## 2016-12-13 DIAGNOSIS — M25561 Pain in right knee: Secondary | ICD-10-CM | POA: Diagnosis not present

## 2016-12-13 DIAGNOSIS — J329 Chronic sinusitis, unspecified: Secondary | ICD-10-CM | POA: Insufficient documentation

## 2016-12-13 DIAGNOSIS — R569 Unspecified convulsions: Secondary | ICD-10-CM | POA: Insufficient documentation

## 2016-12-13 DIAGNOSIS — E559 Vitamin D deficiency, unspecified: Secondary | ICD-10-CM | POA: Diagnosis not present

## 2016-12-13 DIAGNOSIS — F1190 Opioid use, unspecified, uncomplicated: Secondary | ICD-10-CM

## 2016-12-13 DIAGNOSIS — G4730 Sleep apnea, unspecified: Secondary | ICD-10-CM | POA: Diagnosis not present

## 2016-12-13 DIAGNOSIS — F319 Bipolar disorder, unspecified: Secondary | ICD-10-CM | POA: Diagnosis not present

## 2016-12-13 DIAGNOSIS — M7918 Myalgia, other site: Secondary | ICD-10-CM | POA: Insufficient documentation

## 2016-12-13 DIAGNOSIS — F419 Anxiety disorder, unspecified: Secondary | ICD-10-CM | POA: Diagnosis not present

## 2016-12-13 DIAGNOSIS — M4802 Spinal stenosis, cervical region: Secondary | ICD-10-CM | POA: Diagnosis not present

## 2016-12-13 DIAGNOSIS — M549 Dorsalgia, unspecified: Secondary | ICD-10-CM

## 2016-12-13 DIAGNOSIS — Z79891 Long term (current) use of opiate analgesic: Secondary | ICD-10-CM | POA: Diagnosis not present

## 2016-12-13 DIAGNOSIS — G8929 Other chronic pain: Secondary | ICD-10-CM

## 2016-12-13 DIAGNOSIS — F17210 Nicotine dependence, cigarettes, uncomplicated: Secondary | ICD-10-CM | POA: Insufficient documentation

## 2016-12-13 DIAGNOSIS — M5412 Radiculopathy, cervical region: Secondary | ICD-10-CM

## 2016-12-13 DIAGNOSIS — M25511 Pain in right shoulder: Secondary | ICD-10-CM | POA: Diagnosis not present

## 2016-12-13 DIAGNOSIS — M48061 Spinal stenosis, lumbar region without neurogenic claudication: Secondary | ICD-10-CM | POA: Diagnosis not present

## 2016-12-13 DIAGNOSIS — R252 Cramp and spasm: Secondary | ICD-10-CM | POA: Diagnosis not present

## 2016-12-13 DIAGNOSIS — G894 Chronic pain syndrome: Secondary | ICD-10-CM | POA: Diagnosis not present

## 2016-12-13 DIAGNOSIS — M791 Myalgia: Secondary | ICD-10-CM

## 2016-12-13 DIAGNOSIS — R490 Dysphonia: Secondary | ICD-10-CM | POA: Diagnosis not present

## 2016-12-13 HISTORY — DX: Myalgia, other site: M79.18

## 2016-12-13 HISTORY — DX: Neuralgia and neuritis, unspecified: M79.2

## 2016-12-13 MED ORDER — OXYCODONE HCL 5 MG PO TABS: 5.0000 mg | ORAL_TABLET | Freq: Three times a day (TID) | ORAL | 0 refills | Status: DC | PRN

## 2016-12-13 MED ORDER — OXYCODONE-ACETAMINOPHEN 5-325 MG PO TABS: 1.0000 | ORAL_TABLET | Freq: Three times a day (TID) | ORAL | 0 refills | Status: DC | PRN

## 2016-12-13 MED ORDER — ERGOCALCIFEROL 1.25 MG (50000 UT) PO CAPS: 50000.0000 [IU] | ORAL_CAPSULE | ORAL | 0 refills | Status: AC

## 2016-12-13 MED ORDER — CYCLOBENZAPRINE HCL 10 MG PO TABS: 10.0000 mg | ORAL_TABLET | Freq: Three times a day (TID) | ORAL | 0 refills | Status: AC

## 2016-12-13 MED ORDER — CALCIUM PLUS D3 ABSORBABLE 600-2500 MG-UNIT PO CAPS: 1.0000 | ORAL_CAPSULE | Freq: Every day | ORAL | 0 refills | Status: DC

## 2016-12-13 MED ORDER — MAGNESIUM OXIDE -MG SUPPLEMENT 500 MG PO CAPS: 1.0000 | ORAL_CAPSULE | Freq: Two times a day (BID) | ORAL | 0 refills | Status: AC

## 2016-12-13 MED ORDER — CHOLECALCIFEROL 125 MCG (5000 UT) PO CAPS: 5000.0000 [IU] | ORAL_CAPSULE | Freq: Every day | ORAL | 0 refills | Status: DC

## 2016-12-13 MED ORDER — PREGABALIN 75 MG PO CAPS: 75.0000 mg | ORAL_CAPSULE | Freq: Two times a day (BID) | ORAL | 0 refills | Status: AC

## 2016-12-13 NOTE — Patient Instructions (Addendum)
Pre procedure instructions given with teach back 3 done.  You have been given a script for oxycodone and Lyrica today.    Pain Score  Introduction: The pain score used by this practice is the Verbal Numerical Rating Scale (VNRS-11). This is an 11-point scale. It is for adults and children 10 years or older. There are significant differences in how the pain score is reported, used, and applied. Forget everything you learned in the past and learn this scoring system.  General Information: The scale should reflect your current level of pain. Unless you are specifically asked for the level of your worst pain, or your average pain. If you are asked for one of these two, then it should be understood that it is over the past 24 hours.  Basic Activities of Daily Living (ADL): Personal hygiene, dressing, eating, transferring, and using restroom.  Instructions: Most patients tend to report their level of pain as a combination of two factors, their physical pain and their psychosocial pain. This last one is also known as "suffering" and it is reflection of how physical pain affects you socially and psychologically. From now on, report them separately. From this point on, when asked to report your pain level, report only your physical pain. Use the following table for reference.  Pain Clinic Pain Levels (0-5/10)  Pain Level Score Description  No Pain 0   Mild pain 1 Nagging, annoying, but does not interfere with basic activities of daily living (ADL). Patients are able to eat, bathe, get dressed, toileting (being able to get on and off the toilet and perform personal hygiene functions), transfer (move in and out of bed or a chair without assistance), and maintain continence (able to control bladder and bowel functions). Blood pressure and heart rate are unaffected. A normal heart rate for a healthy adult ranges from 60 to 100 bpm (beats per minute).   Mild to moderate pain 2 Noticeable and distracting.  Impossible to hide from other people. More frequent flare-ups. Still possible to adapt and function close to normal. It can be very annoying and may have occasional stronger flare-ups. With discipline, patients may get used to it and adapt.   Moderate pain 3 Interferes significantly with activities of daily living (ADL). It becomes difficult to feed, bathe, get dressed, get on and off the toilet or to perform personal hygiene functions. Difficult to get in and out of bed or a chair without assistance. Very distracting. With effort, it can be ignored when deeply involved in activities.   Moderately severe pain 4 Impossible to ignore for more than a few minutes. With effort, patients may still be able to manage work or participate in some social activities. Very difficult to concentrate. Signs of autonomic nervous system discharge are evident: dilated pupils (mydriasis); mild sweating (diaphoresis); sleep interference. Heart rate becomes elevated (>115 bpm). Diastolic blood pressure (lower number) rises above 100 mmHg. Patients find relief in laying down and not moving.   Severe pain 5 Intense and extremely unpleasant. Associated with frowning face and frequent crying. Pain overwhelms the senses.  Ability to do any activity or maintain social relationships becomes significantly limited. Conversation becomes difficult. Pacing back and forth is common, as getting into a comfortable position is nearly impossible. Pain wakes you up from deep sleep. Physical signs will be obvious: pupillary dilation; increased sweating; goosebumps; brisk reflexes; cold, clammy hands and feet; nausea, vomiting or dry heaves; loss of appetite; significant sleep disturbance with inability to fall asleep or to  remain asleep. When persistent, significant weight loss is observed due to the complete loss of appetite and sleep deprivation.  Blood pressure and heart rate becomes significantly elevated. Caution: If elevated blood pressure  triggers a pounding headache associated with blurred vision, then the patient should immediately seek attention at an urgent or emergency care unit, as these may be signs of an impending stroke.    Emergency Department Pain Levels (6-10/10)  Emergency Room Pain 6 Severely limiting. Requires emergency care and should not be seen or managed at an outpatient pain management facility. Communication becomes difficult and requires great effort. Assistance to reach the emergency department may be required. Facial flushing and profuse sweating along with potentially dangerous increases in heart rate and blood pressure will be evident.   Distressing pain 7 Self-care is very difficult. Assistance is required to transport, or use restroom. Assistance to reach the emergency department will be required. Tasks requiring coordination, such as bathing and getting dressed become very difficult.   Disabling pain 8 Self-care is no longer possible. At this level, pain is disabling. The individual is unable to do even the most "basic" activities such as walking, eating, bathing, dressing, transferring to a bed, or toileting. Fine motor skills are lost. It is difficult to think clearly.   Incapacitating pain 9 Pain becomes incapacitating. Thought processing is no longer possible. Difficult to remember your own name. Control of movement and coordination are lost.   The worst pain imaginable 10 At this level, most patients pass out from pain. When this level is reached, collapse of the autonomic nervous system occurs, leading to a sudden drop in blood pressure and heart rate. This in turn results in a temporary and dramatic drop in blood flow to the brain, leading to a loss of consciousness. Fainting is one of the body's self defense mechanisms. Passing out puts the brain in a calmed state and causes it to shut down for a while, in order to begin the healing process.    Summary: 1. Refer to this scale when providing [place] with  your pain level. 2. Be accurate and careful when reporting your pain level. This will help with your care. 3. Over-reporting your pain level will lead to loss of credibility. 4. Even a level of 1/10 means that there is pain and will be treated at our facility. 5. High, inaccurate reporting will be documented as "Symptom Exaggeration", leading to loss of credibility and suspicions of possible secondary gains such as obtaining more narcotics, or wanting to appear disabled, for fraudulent reasons. 6. Only pain levels of 5 or below will be seen at our facility. 7. Pain levels of 6 and above will be sent to the Emergency Department and the appointment cancelled. _____________________________________________________________________________________________  ____________________________________________________________________________________________  Medication Rules  Applies to: All patients receiving prescriptions (written or electronic).  Pharmacy of record: Pharmacy where electronic prescriptions will be sent. If written prescriptions are taken to a different pharmacy, please inform the nursing staff. The pharmacy listed in the electronic medical record should be the one where you would like electronic prescriptions to be sent.  Prescription refills: Only during scheduled appointments. Applies to both, written and electronic prescriptions.  NOTE: The following applies primarily to controlled substances (Opioid Pain Medications)  Patient's responsibilities: 1. Pain Pills: Bring all pain pills to every appointment (except for procedure appointments). 2. Pill Bottles: Bring pills in original pharmacy bottle. Always bring newest bottle. Bring bottle, even if empty. 3. Medication refills: You are responsible for knowing  and keeping track of what medications you need refilled. The day before your appointment, write a list of all prescriptions that need to be refilled. Bring that list to your  appointment and give it to the admitting nurse. Prescriptions will be written only during appointments. If you forget a medication, it will not be "Called in", "Faxed", or "electronically sent". You will need to get another appointment to get these prescribed. 4. Prescription Accuracy: You are responsible for carefully inspecting your prescriptions before leaving our office. Have the discharge nurse carefully go over each prescription with you, before taking them home. Make sure that your name is accurately spelled, that your address is correct. Check the name and dose of your medication to make sure it is accurate. Check the number of pills, and the written instructions to make sure they are clear and accurate. Make sure that you are given enough medication to last until your next medication refill appointment. 5. Taking Medication: Take medication as prescribed. Never take more pills than instructed. Never take medication more frequently than prescribed. Taking less pills or less frequently is permitted and encouraged, when it comes to controlled substances (written prescriptions).  6. Inform other Doctors: Always inform, all of your healthcare providers, of all the medications you take. 7. Pain Medication from other Providers: You are not allowed to accept any additional pain medication from any other Doctor or Healthcare provider. There are two exceptions to this rule. (see below) In the event that you require additional pain medication, you are responsible for notifying us, as stated below. 8. Medication Agreement: You are responsible for carefully reading and following our Medication Agreement. This must be signed before receiving any prescriptions from our practice. Safely store a copy of your signed Agreement. Violations to the Agreement will result in no further prescriptions. (Additional copies of our Medication Agreement are available upon request.) 9. Laws, Rules, & Regulations: All patients are  expected to follow all Federal and [employer], Rules, [employer]. Ignorance of the Laws does not constitute a valid excuse.  Exceptions: There are only two exceptions to the rule of not receiving pain medications from other Healthcare Providers. 1. Exception #1 (Emergencies): In the event of an emergency (i.e.: accident requiring emergency care), you are allowed to receive additional pain medication. However, you are responsible for: As soon as you are able, call our office [phone], at any time of the day or night, and leave a message stating your name, the date and nature of the emergency, and the name and dose of the medication prescribed. In the event that your call is answered by a member of our staff, make sure to document and save the date, time, and the name of the person that took your information.  2. Exception #2 (Planned Surgery): In the event that you are scheduled by another doctor or dentist to have any type of surgery or procedure, you are allowed (for a period no longer than 30 days), to receive additional pain medication, for the acute post-op pain. However, in this case, you are responsible for picking up a copy of our "Post-op Pain Management for Surgeons" handout, and giving it to your surgeon or dentist. This document is available at our office, and does not require an appointment to obtain it. Simply go to our office during business hours (Monday-Thursday from 8:00 AM to 4:00 PM) (Friday 8:00 AM to 12:00 Noon) or if you have a scheduled appointment with [place], prior to your surgery, and  ask for it by name. In addition, you will need to provide [place] with your name, name of your surgeon, type of surgery, and date of procedure or surgery.  _____________________________________________________________________________________________  _______________________________________________________________  Preparing for Procedure with Sedation Instructions: . Oral Intake: Do not  eat or drink anything for at least 8 hours prior to your procedure. . Transportation: Public transportation is not allowed. Bring an adult driver. The driver must be physically present in our waiting room before any procedure can be started. [facility] Physical Assistance: Bring an adult physically capable of assisting you, in the event you need help. This adult should keep you company at home for at least 6 hours after the procedure. . Blood Pressure Medicine: Take your blood pressure medicine with a sip of water the morning of the procedure. . Blood thinners:  . Diabetics on insulin: Notify the staff so that you can be scheduled 1st case in the morning. If your diabetes requires high dose insulin, take only  of your normal insulin dose the morning of the procedure and notify the staff that you have done so. . Preventing infections: Shower with an antibacterial soap the morning of your procedure. . Build-up your immune system: Take 1000 mg of Vitamin C with every meal (3 times a day) the day prior to your procedure. Marland Kitchen Antibiotics: Inform the staff if you have a condition or reason that requires you to take antibiotics before dental procedures. . Pregnancy: If you are pregnant, call and cancel the procedure. . Sickness: If you have a cold, fever, or any active infections, call and cancel the procedure. . Arrival: You must be in the facility at least 30 minutes prior to your scheduled procedure. . Children: Do not bring children with you. . Dress appropriately: Bring dark clothing that you would not mind if they get stained. . Valuables: Do not bring any jewelry or valuables. Procedure appointments are reserved for interventional treatments only. Marland Kitchen No Prescription Refills. . No medication changes will be discussed during procedure appointments. . No disability issues will be  discussed. ______________________________________________________________________________________________  _______________________________________________________________  Preparing for Procedure with Sedation Instructions: . Oral Intake: Do not eat or drink anything for at least 8 hours prior to your procedure. . Transportation: Public transportation is not allowed. Bring an adult driver. The driver must be physically present in our waiting room before any procedure can be started. Marland Kitchen Physical Assistance: Bring an adult physically capable of assisting you, in the event you need help. This adult should keep you company at home for at least 6 hours after the procedure. . Blood Pressure Medicine: Take your blood pressure medicine with a sip of water the morning of the procedure. . Blood thinners:  . Diabetics on insulin: Notify the staff so that you can be scheduled 1st case in the morning. If your diabetes requires high dose insulin, take only  of your normal insulin dose the morning of the procedure and notify the staff that you have done so. . Preventing infections: Shower with an antibacterial soap the morning of your procedure. . Build-up your immune system: Take 1000 mg of Vitamin C with every meal (3 times a day) the day prior to your procedure. Marland Kitchen Antibiotics: Inform the staff if you have a condition or reason that requires you to take antibiotics before dental procedures. . Pregnancy: If you are pregnant, call and cancel the procedure. . Sickness: If you have a cold, fever, or any active infections, call and cancel the procedure. . Arrival:  You must be in the facility at least 30 minutes prior to your scheduled procedure. . Children: Do not bring children with you. . Dress appropriately: Bring dark clothing that you would not mind if they get stained. . Valuables: Do not bring any jewelry or valuables. Procedure appointments are reserved for interventional treatments only. Marland Kitchen No  Prescription Refills. . No medication changes will be discussed during procedure appointments. . No disability issues will be discussed. ______________________________________________________________________________________________  Epidural Steroid Injection Patient Information  Description: The epidural space surrounds the nerves as they exit the spinal cord.  In some patients, the nerves can be compressed and inflamed by a bulging disc or a tight spinal canal (spinal stenosis).  By injecting steroids into the epidural space, we can bring irritated nerves into direct contact with a potentially helpful medication.  These steroids act directly on the irritated nerves and can reduce swelling and inflammation which often leads to decreased pain.  Epidural steroids may be injected anywhere along the spine and from the neck to the low back depending upon the location of your pain.   After numbing the skin with local anesthetic (like Novocaine), a small needle is passed into the epidural space slowly.  You may experience a sensation of pressure while this is being done.  The entire block usually last less than 10 minutes.  Conditions which may be treated by epidural steroids:   Low back and leg pain  Neck and arm pain  Spinal stenosis  Post-laminectomy syndrome  Herpes zoster (shingles) pain  Pain from compression fractures  Preparation for the injection:  3. Do not eat any solid food or dairy products within 8 hours of your appointment.  4. You may drink clear liquids up to 3 hours before appointment.  Clear liquids include water, black coffee, juice or soda.  No milk or cream please. 5. You may take your regular medication, including pain medications, with a sip of water before your appointment  Diabetics should hold regular insulin (if taken separately) and take 1/2 normal NPH dos the morning of the procedure.  Carry some sugar containing items with you to your appointment. 6. A driver  must accompany you and be prepared to drive you home after your procedure.  7. Bring all your current medications with your. 8. An IV may be inserted and sedation may be given at the discretion of the physician.   9. A blood pressure cuff, EKG and other monitors will often be applied during the procedure.  Some patients may need to have extra oxygen administered for a short period. 10. You will be asked to provide medical information, including your allergies, prior to the procedure.  We must know immediately if you are taking blood thinners (like Coumadin/Warfarin)  Or if you are allergic to IV iodine contrast (dye). We must know if you could possible be pregnant.  Possible side-effects:  Bleeding from needle site  Infection (rare, may require surgery)  Nerve injury (rare)  Numbness & tingling (temporary)  Difficulty urinating (rare, temporary)  Spinal headache ( a headache worse with upright posture)  Light -headedness (temporary)  Pain at injection site (several days)  Decreased blood pressure (temporary)  Weakness in arm/leg (temporary)  Pressure sensation in back/neck (temporary)  Call if you experience:  Fever/chills associated with headache or increased back/neck pain.  Headache worsened by an upright position.  New onset weakness or numbness of an extremity below the injection site  Hives or difficulty breathing (go to the emergency  room)  Inflammation or drainage at the infection site  Severe back/neck pain  Any new symptoms which are concerning to you  Please note:  Although the local anesthetic injected can often make your back or neck feel good for several hours after the injection, the pain will likely return.  It takes 3-7 days for steroids to work in the epidural space.  You may not notice any pain relief for at least that one week.  If effective, we will often do a series of three injections spaced 3-6 weeks apart to maximally decrease your pain.   After the initial series, we generally will wait several months before considering a repeat injection of the same type.  If you have any questions, please call [phone] [facility]

## 2016-12-13 NOTE — Progress Notes (Signed)
Safety precautions to be maintained throughout the outpatient stay will include: orient to surroundings, keep bed in low position, maintain call bell within reach at all times, provide assistance with transfer out of bed and ambulation.  

## 2016-12-17 LAB — TOXASSURE SELECT 13 (MW), URINE

## 2016-12-20 NOTE — Progress Notes (Signed)
  THC-DISCLOSED NOTE: This forensic urine drug screen (UDS) test was conducted using a state-of-the-art ultra high performance liquid chromatography and mass spectrometry system (UPLC/MS-MS), the most sophisticated and accurate method available. UPLC/MS-MS is 1,000 times more precise and accurate than standard gas chromatography and mass spectrometry (GC/MS). This system can analyze 26 drug categories and 180 drug compounds.  This patient's UDT results were (+) for Cannabinoids. The patient has been informed of our "Zero Tolerance" towards the use of any illegal substances. Because the patient did disclose the use of cannabinoids at the time of collection, we may not discontinue the posibility of using controlled substances, but we will limit them contingent on the patient's discontinuation of all illegal substances. We will also limit the interval between refills to no more that 30 days, with frequent unanounced UDTs. As long as the quantitative levels are seen to be decreasing, we may continue therapy. The instant they increase or remain the same, we will discontinue the opioid management option. 

## 2016-12-28 ENCOUNTER — Ambulatory Visit: Payer: Medicare Other | Admitting: Pain Medicine

## 2017-01-02 ENCOUNTER — Encounter: Payer: Self-pay | Admitting: Pain Medicine

## 2017-01-02 ENCOUNTER — Ambulatory Visit
Admission: RE | Admit: 2017-01-02 | Discharge: 2017-01-02 | Disposition: A | Discharge status: Home / Self Care | Payer: Medicare Other | Source: Ambulatory Visit | Attending: Pain Medicine | Admitting: Pain Medicine

## 2017-01-02 ENCOUNTER — Ambulatory Visit (HOSPITAL_BASED_OUTPATIENT_CLINIC_OR_DEPARTMENT_OTHER): Payer: Medicare Other | Admitting: Pain Medicine

## 2017-01-02 VITALS — BP 121/68 | HR 67 | Temp 97.6°F | Resp 16 | Ht 69.0 in | Wt 195.0 lb

## 2017-01-02 DIAGNOSIS — M79601 Pain in right arm: Secondary | ICD-10-CM | POA: Insufficient documentation

## 2017-01-02 DIAGNOSIS — M9981 Other biomechanical lesions of cervical region: Secondary | ICD-10-CM

## 2017-01-02 DIAGNOSIS — M4802 Spinal stenosis, cervical region: Secondary | ICD-10-CM | POA: Insufficient documentation

## 2017-01-02 DIAGNOSIS — G8929 Other chronic pain: Secondary | ICD-10-CM | POA: Insufficient documentation

## 2017-01-02 DIAGNOSIS — M542 Cervicalgia: Secondary | ICD-10-CM

## 2017-01-02 DIAGNOSIS — M5412 Radiculopathy, cervical region: Secondary | ICD-10-CM | POA: Diagnosis not present

## 2017-01-02 MED ORDER — IOPAMIDOL (ISOVUE-M 200) INJECTION 41%
10.0000 mL | Freq: Once | INTRAMUSCULAR | Status: AC
  Administered 2017-01-02: 20 mL via EPIDURAL
  Filled 2017-01-02: qty 10

## 2017-01-02 MED ORDER — DEXAMETHASONE SODIUM PHOSPHATE 10 MG/ML IJ SOLN
10.0000 mg | Freq: Once | INTRAMUSCULAR | Status: AC
  Administered 2017-01-02: 10 mg
  Filled 2017-01-02: qty 1

## 2017-01-02 MED ORDER — LACTATED RINGERS IV SOLN
1000.0000 mL | Freq: Once | INTRAVENOUS | Status: AC
  Administered 2017-01-02: 1000 mL via INTRAVENOUS

## 2017-01-02 MED ORDER — FENTANYL CITRATE (PF) 100 MCG/2ML IJ SOLN
25.0000 ug | INTRAMUSCULAR | Status: DC | PRN
  Administered 2017-01-02: 100 ug via INTRAVENOUS
  Filled 2017-01-02: qty 2

## 2017-01-02 MED ORDER — LIDOCAINE HCL (PF) 1 % IJ SOLN
10.0000 mL | Freq: Once | INTRAMUSCULAR | Status: AC
  Administered 2017-01-02: 5 mL
  Filled 2017-01-02: qty 10

## 2017-01-02 MED ORDER — SODIUM CHLORIDE 0.9% FLUSH
1.0000 mL | Freq: Once | INTRAVENOUS | Status: AC
  Administered 2017-01-02: 10 mL

## 2017-01-02 MED ORDER — ROPIVACAINE HCL 2 MG/ML IJ SOLN
1.0000 mL | Freq: Once | INTRAMUSCULAR | Status: AC
  Administered 2017-01-02: 10 mL via EPIDURAL
  Filled 2017-01-02: qty 10

## 2017-01-02 MED ORDER — SODIUM CHLORIDE 0.9 % IJ SOLN
INTRAMUSCULAR | Status: AC
  Filled 2017-01-02: qty 10

## 2017-01-02 MED ORDER — MIDAZOLAM HCL 5 MG/5ML IJ SOLN
1.0000 mg | INTRAMUSCULAR | Status: DC | PRN
  Administered 2017-01-02: 3 mg via INTRAVENOUS
  Filled 2017-01-02: qty 5

## 2017-01-02 NOTE — Progress Notes (Signed)
Patient's Name: Patrick Gregory  MRN: [id number]  Referring Provider: Leone Haven, MD  DOB: 06-Jan-1961  PCP: Leone Haven, MD  DOS: 01/02/2017  Note by: Kathlen Brunswick. Dossie Arbour, MD  Service setting: Ambulatory outpatient  Location: [facility] (AMB) Pain Management Facility  Visit type: Procedure  Specialty: Interventional Pain Management  Patient type: Established   Primary Reason for Visit: Interventional Pain Management Treatment. CC: Neck Pain (right)  Procedure:  Anesthesia, Analgesia, Anxiolysis:  Type: Diagnostic, Inter-Laminar, Epidural Steroid Injection Region: Posterior Cervico-thoracic Region Level: C7-T1 Laterality: Right-Sided Paramedial  Type: Local Anesthesia with Moderate (Conscious) Sedation Local Anesthetic: Lidocaine 1% Route: Intravenous (IV) IV Access: Secured Sedation: Meaningful verbal contact was maintained at all times during the procedure  Indication(s): Analgesia and Anxiety  Indications: 1. Chronic cervical radicular pain (Right) (intermittent)   2. Cervical foraminal stenosis (Right)   3. Chronic neck pain (Location of Primary Source of Pain) (Bilateral) (R>L)   4. Chronic upper extremity pain (Right)    Pain Score: Pre-procedure: 7 /10 Post-procedure: 0-No pain/10  Pre-op Assessment:  Previous date of service: 12/13/16 Service provided:   Patrick Gregory is a 56 y.o. (year old), male patient, seen today for interventional treatment. He  has a past surgical history that includes Throat surgery; Direct laryngoscopy (Right, 10/23/2015); and Rotator cuff repair (Bilateral, 2005, 2013). His primarily concern today is the Neck Pain (right)  Initial Vital Signs: Blood pressure 114/79, pulse 73, temperature 98 F (36.7 C), resp. rate 16, height 5\' 9"  (1.753 m), weight 195 lb (88.5 kg), SpO2 97 %. BMI: 28.80 kg/m  Risk Assessment: Allergies: Reviewed. He has No Known Allergies.  Allergy Precautions: None required Coagulopathies: Reviewed. None identified.   Blood-thinner therapy: None at this time Active Infection(s): Reviewed. None identified. Patrick Gregory is afebrile  Site Confirmation: Patrick Gregory was asked to confirm the procedure and laterality before marking the site Procedure checklist: Completed Consent: Before the procedure and under the influence of no sedative(s), amnesic(s), or anxiolytics, the patient was informed of the treatment options, risks and possible complications. To fulfill our ethical and legal obligations, as recommended by the American Medical Association's Code of Ethics, I have informed the patient of my clinical impression; the nature and purpose of the treatment or procedure; the risks, benefits, and possible complications of the intervention; the alternatives, including doing nothing; the risk(s) and benefit(s) of the alternative treatment(s) or procedure(s); and the risk(s) and benefit(s) of doing nothing. The patient was provided information about the general risks and possible complications associated with the procedure. These may include, but are not limited to: failure to achieve desired goals, infection, bleeding, organ or nerve damage, allergic reactions, paralysis, and death. In addition, the patient was informed of those risks and complications associated to Spine-related procedures, such as failure to decrease pain; infection (i.e.: Meningitis, epidural or intraspinal abscess); bleeding (i.e.: epidural hematoma, subarachnoid hemorrhage, or any other type of intraspinal or peri-dural bleeding); organ or nerve damage (i.e.: Any type of peripheral nerve, nerve root, or spinal cord injury) with subsequent damage to sensory, motor, and/or autonomic systems, resulting in permanent pain, numbness, and/or weakness of one or several areas of the body; allergic reactions; (i.e.: anaphylactic reaction); and/or death. Furthermore, the patient was informed of those risks and complications associated with the medications. These include,  but are not limited to: allergic reactions (i.e.: anaphylactic or anaphylactoid reaction(s)); adrenal axis suppression; blood sugar elevation that in diabetics may result in ketoacidosis or comma; water retention that in patients with history  of congestive heart failure may result in shortness of breath, pulmonary edema, and decompensation with resultant heart failure; weight gain; swelling or edema; medication-induced neural toxicity; particulate matter embolism and blood vessel occlusion with resultant organ, and/or nervous system infarction; and/or aseptic necrosis of one or more joints. Finally, the patient was informed that Medicine is not an exact science; therefore, there is also the possibility of unforeseen or unpredictable risks and/or possible complications that may result in a catastrophic outcome. The patient indicated having understood very clearly. We have given the patient no guarantees and we have made no promises. Enough time was given to the patient to ask questions, all of which were answered to the patient's satisfaction. Patrick Gregory has indicated that he wanted to continue with the procedure. Attestation: I, the ordering provider, attest that I have discussed with the patient the benefits, risks, side-effects, alternatives, likelihood of achieving goals, and potential problems during recovery for the procedure that I have provided informed consent. Date: 01/02/2017; Time: 11:29 AM  Pre-Procedure Preparation:  Monitoring: As per clinic protocol. Respiration, ETCO2, SpO2, BP, heart rate and rhythm monitor placed and checked for adequate function Safety Precautions: Patient was assessed for positional comfort and pressure points before starting the procedure. Time-out: I initiated and conducted the "Time-out" before starting the procedure, as per protocol. The patient was asked to participate by confirming the accuracy of the "Time Out" information. Verification of the correct person, site, and  procedure were performed and confirmed by me, the nursing staff, and the patient. "Time-out" conducted as per Joint Commission's Universal Protocol (UP.01.01.01). "Time-out" Date & Time: 01/02/2017; 1200 hrs.  Description of Procedure Process:   Position: Prone with head of the table was raised to facilitate breathing. Target Area: For Epidural Steroid injections the target is the interlaminar space, initially targeting the lower border of the superior vertebral body lamina. Approach: Paramedial approach. Area Prepped: Entire PosteriorCervical Region Prepping solution: ChloraPrep (2% chlorhexidine gluconate and 70% isopropyl alcohol) Safety Precautions: Aspiration looking for blood return was conducted prior to all injections. At no point did we inject any substances, as a needle was being advanced. No attempts were made at seeking any paresthesias. Safe injection practices and needle disposal techniques used. Medications properly checked for expiration dates. SDV (single dose vial) medications used. Description of the Procedure: Protocol guidelines were followed. The procedure needle was introduced through the skin, ipsilateral to the reported pain, and advanced to the target area. Bone was contacted and the needle walked caudad, until the lamina was cleared. The epidural space was identified using "loss-of-resistance technique" with 2-3 ml of PF-NaCl (0.9% NSS), in a 5cc LOR glass syringe. Vitals:   01/02/17 1211 01/02/17 1218 01/02/17 1228 01/02/17 1238  BP: 137/79 129/72 129/72 121/68  Pulse: 67     Resp: 12 16 11 16   Temp:  97.6 F (36.4 C)    TempSrc:  Tympanic    SpO2: 94% 95% 94% 95%  Weight:      Height:        Start Time: 1201 hrs. End Time: 1209 hrs. Materials:  Needle(s) Type: Epidural needle Gauge: 17G Length: 3.5-in Medication(s): We administered lactated ringers, midazolam, fentaNYL, dexamethasone, iopamidol, lidocaine (PF), sodium chloride flush, and ropivacaine (PF) 2  mg/mL (0.2%). Please see chart orders for dosing details.  Imaging Guidance (Spinal):  Type of Imaging Technique: Fluoroscopy Guidance (Spinal) Indication(s): Assistance in needle guidance and placement for procedures requiring needle placement in or near specific anatomical locations not easily accessible without such  assistance. Exposure Time: Please see nurses notes. Contrast: Before injecting any contrast, we confirmed that the patient did not have an allergy to iodine, shellfish, or radiological contrast. Once satisfactory needle placement was completed at the desired level, radiological contrast was injected. Contrast injected under live fluoroscopy. No contrast complications. See chart for type and volume of contrast used. Fluoroscopic Guidance: I was personally present during the use of fluoroscopy. "Tunnel Vision Technique" used to obtain the best possible view of the target area. Parallax error corrected before commencing the procedure. "Direction-depth-direction" technique used to introduce the needle under continuous pulsed fluoroscopy. Once target was reached, antero-posterior, oblique, and lateral fluoroscopic projection used confirm needle placement in all planes. Images permanently stored in EMR. Interpretation: I personally interpreted the imaging intraoperatively. Adequate needle placement confirmed in multiple planes. Appropriate spread of contrast into desired area was observed. No evidence of afferent or efferent intravascular uptake. No intrathecal or subarachnoid spread observed. Permanent images saved into the patient's record.  Antibiotic Prophylaxis:  Indication(s): None identified Antibiotic given: None  Post-operative Assessment:  EBL: None Complications: No immediate post-treatment complications observed by team, or reported by patient. Note: The patient tolerated the entire procedure well. A repeat set of vitals were taken after the procedure and the patient was kept  under observation following institutional policy, for this type of procedure. Post-procedural neurological assessment was performed, showing return to baseline, prior to discharge. The patient was provided with post-procedure discharge instructions, including a section on how to identify potential problems. Should any problems arise concerning this procedure, the patient was given instructions to immediately contact us, at any time, without hesitation. In any case, we plan to contact the patient by telephone for a follow-up status report regarding this interventional procedure. Comments:  No additional relevant information.  Plan of Care  Disposition: Discharge home  Discharge Date & Time: 01/02/2017; 1240 hrs.  Physician-requested Follow-up:  Return for post-procedure eval (in 2 wks).  Future Appointments Date Time Provider [place]  01/06/2017 4:00 PM O'Brien-Blaney, Bryson Corona, LPN [facility] None  2/56/3893 2:00 PM Milinda Pointer, MD [facility] None  02/03/2017 1:00 PM Cameron Sprang, MD [facility] None   Medications ordered for procedure: Meds ordered this encounter  Medications  . lactated ringers infusion 1,000 mL  . midazolam (VERSED) 5 MG/5ML injection 1-2 mg    Make sure Flumazenil is available in the pyxis when using this medication. If oversedation occurs, administer 0.2 mg IV over 15 sec. If after 45 sec no response, administer 0.2 mg again over 1 min; may repeat at 1 min intervals; not to exceed 4 doses (1 mg)  . fentaNYL (SUBLIMAZE) injection 25-50 mcg    Make sure Narcan is available in the pyxis when using this medication. In the event of respiratory depression (RR< 8/min): Titrate NARCAN (naloxone) in increments of 0.1 to 0.2 mg IV at 2-3 minute intervals, until desired degree of reversal.  . dexamethasone (DECADRON) injection 10 mg  . iopamidol (ISOVUE-M) 41 % intrathecal injection 10 mL  . lidocaine (PF) (XYLOCAINE) 1 % injection 10 mL  . sodium chloride flush (NS)  0.9 % injection 1 mL  . ropivacaine (PF) 2 mg/mL (0.2%) (NAROPIN) injection 1 mL   Medications administered: We administered lactated ringers, midazolam, fentaNYL, dexamethasone, iopamidol, lidocaine (PF), sodium chloride flush, and ropivacaine (PF) 2 mg/mL (0.2%).  See the medical record for exact dosing, route, and time of administration.  Lab-work, Procedure(s), & Referral(s) Ordered: Orders Placed This Encounter  Procedures  . Cervical  Epidural Injection  . DG C-Arm 1-60 Min-No Report  . Informed Consent Details: Transcribe to consent form and obtain patient signature  . Provider attestation of informed consent for procedure/surgical case  . Verify informed consent  . Discharge instructions  . Follow-up   Imaging Ordered: New Prescriptions   No medications on file   Primary Care Physician: Leone Haven, MD Location: [facility] Note by: Kathlen Brunswick. Dossie Arbour, M.D, DABA, DABAPM, DABPM, DABIPP, FIPP Date: 01/02/2017; Time: 1:49 PM  Disclaimer:  Medicine is not an Chief Strategy Officer. The only guarantee in medicine is that nothing is guaranteed. It is important to note that the decision to proceed with this intervention was based on the information collected from the patient. The Data and conclusions were drawn from the patient's questionnaire, the interview, and the physical examination. Because the information was provided in large part by the patient, it cannot be guaranteed that it has not been purposely or unconsciously manipulated. Every effort has been made to obtain as much relevant data as possible for this evaluation. It is important to note that the conclusions that lead to this procedure are derived in large part from the available data. Always take into account that the treatment will also be dependent on availability of resources and existing treatment guidelines, considered by other Pain Management Practitioners as being common knowledge and  practice, at the time of the intervention. For Medico-Legal purposes, it is also important to point out that variation in procedural techniques and pharmacological choices are the acceptable norm. The indications, contraindications, technique, and results of the above procedure should only be interpreted and judged by a Board-Certified Interventional Pain Specialist with extensive familiarity and expertise in the same exact procedure and technique.  Instructions provided at this appointment: Patient Instructions  ____________________________________________________________________________________________  Post-Procedure instructions Instructions:  Apply ice: Fill a plastic sandwich bag with crushed ice. Cover it with a small towel and apply to injection site. Apply for 15 minutes then remove x 15 minutes. Repeat sequence on day of procedure, until you go to bed. The purpose is to minimize swelling and discomfort after procedure.  Apply heat: Apply heat to procedure site starting the day following the procedure. The purpose is to treat any soreness and discomfort from the procedure.  Food intake: Start with clear liquids (like water) and advance to regular food, as tolerated.   Physical activities: Keep activities to a minimum for the first 8 hours after the procedure.   Driving: If you have received any sedation, you are not allowed to drive for 24 hours after your procedure.  Blood thinner: Restart your blood thinner 6 hours after your procedure. (Only for those taking blood thinners)  Insulin: As soon as you can eat, you may resume your normal dosing schedule. (Only for those taking insulin)  Infection prevention: Keep procedure site clean and dry.  Post-procedure Pain Diary: Extremely important that this be done correctly and accurately. Recorded information will be used to determine the next step in treatment.  Pain evaluated is that of treated area only. Do not include pain from an  untreated area.  Complete every hour, on the hour, for the initial 8 hours. Set an alarm to help you do this part accurately.  Do not go to sleep and have it completed later. It will not be accurate.  Follow-up appointment: Keep your follow-up appointment after the procedure. Usually 2 weeks for most procedures. (6 weeks in the case of radiofrequency.) Bring you pain  diary.  Expect:  From numbing medicine (AKA: Local Anesthetics): Numbness or decrease in pain.  Onset: Full effect within 15 minutes of injected.  Duration: It will depend on the type of local anesthetic used. On the average, 1 to 8 hours.   From steroids: Decrease in swelling or inflammation. Once inflammation is improved, relief of the pain will follow.  Onset of benefits: Depends on the amount of swelling present. The more swelling, the longer it will take for the benefits to be seen.   Duration: Steroids will stay in the system x 2 weeks. Duration of benefits will depend on multiple posibilities including persistent irritating factors.  From procedure: Some discomfort is to be expected once the numbing medicine wears off. This should be minimal if ice and heat are applied as instructed. Call if:  You experience numbness and weakness that gets worse with time, as opposed to wearing off.  New onset bowel or bladder incontinence. (Spinal procedures only)  Emergency Numbers:  Durning business hours (Monday - Thursday, 8:00 AM - 4:00 PM) (Friday, 9:00 AM - 12:00 Noon): [phone]  After hours: [phone] ____________________________________________________________________________________________  Pain Management Discharge Instructions  General Discharge Instructions :  If you need to reach your doctor call: Monday-Friday 8:00 am - 4:00 pm at [phone] or toll free [phone].  After clinic hours [phone] to have operator reach doctor.  Bring all of your medication bottles to all your  appointments in the pain clinic.  To cancel or reschedule your appointment with Pain Management please remember to call 24 hours in advance to avoid a fee.  Refer to the educational materials which you have been given on: General Risks, I had my Procedure. Discharge Instructions, Post Sedation.  Post Procedure Instructions:  The drugs you were given will stay in your system until tomorrow, so for the next 24 hours you should not drive, make any legal decisions or drink any alcoholic beverages.  You may eat anything you prefer, but it is better to start with liquids then soups and crackers, and gradually work up to solid foods.  Please notify your doctor immediately if you have any unusual bleeding, trouble breathing or pain that is not related to your normal pain.  Depending on the type of procedure that was done, some parts of your body may feel week and/or numb.  This usually clears up by tonight or the next day.  Walk with the use of an assistive device or accompanied by an adult for the 24 hours.  You may use ice on the affected area for the first 24 hours.  Put ice in a Ziploc bag and cover with a towel and place against area 15 minutes on 15 minutes off.  You may switch to heat after 24 hours.

## 2017-01-02 NOTE — Progress Notes (Signed)
Safety precautions to be maintained throughout the outpatient stay will include: orient to surroundings, keep bed in low position, maintain call bell within reach at all times, provide assistance with transfer out of bed and ambulation.  

## 2017-01-02 NOTE — Patient Instructions (Addendum)

## 2017-01-03 ENCOUNTER — Telehealth: Payer: Self-pay | Admitting: *Deleted

## 2017-01-03 NOTE — Telephone Encounter (Signed)
Spoke with patient, denies any problems or concerns from procedure on yesterday.

## 2017-01-04 NOTE — Telephone Encounter (Signed)
Scheduled 01/06/17

## 2017-01-06 ENCOUNTER — Ambulatory Visit: Payer: Medicare Other

## 2017-01-12 ENCOUNTER — Ambulatory Visit: Payer: Medicare Other | Attending: Pain Medicine | Admitting: Pain Medicine

## 2017-01-12 ENCOUNTER — Telehealth: Payer: Self-pay

## 2017-01-12 NOTE — Telephone Encounter (Signed)
Pts wife Stanton Kidney wanted nurse to know shots have helped pain has decreased pain in mouth is no longer there.

## 2017-01-16 NOTE — Progress Notes (Signed)
Patient's Name: Patrick Gregory  MRN: [id number]  Referring Provider: Leone Haven, MD  DOB: [birth year]  PCP: Leone Haven, MD  DOS: 01/17/2017  Note by: Kathlen Brunswick. Dossie Arbour, MD  Service setting: Ambulatory outpatient  Specialty: Interventional Pain Management  Location: [facility] (AMB) Pain Management Facility    Patient type: Established   Primary Reason(s) for Visit: Encounter for prescription drug management & post-procedure evaluation of chronic illness with mild to moderate exacerbation(Level of risk: moderate) CC: Neck Pain  HPI  Patrick Gregory is a 56 y.o. year old, male patient, who comes today for a post-procedure evaluation and medication management. He has Hoarseness; GERD (gastroesophageal reflux disease); Chronic neck pain (Location of Primary Source of Pain) (Bilateral) (R>L); Anxiety; Muscle cramps; Epigastric discomfort; COPD (chronic obstructive pulmonary disease) ([place]); DOE (dyspnea on exertion); Cough; Seizures ([place]); Chronic low back pain (Location of Tertiary source of pain) (Bilateral) (R>L); Transient alteration of awareness; PTSD (post-traumatic stress disorder); Cigarette smoker; Viral URI; Bipolar disorder ([place]); Sleep apnea; Chronic pain syndrome; Long term current use of opiate analgesic; Long term prescription opiate use; Opiate use (20 MME/Day); Chronic upper back pain (Location of Secondary source of pain) (Bilateral) (L>R); Chronic shoulder pain (Bilateral) (R>L); Rotator cuff syndrome (Right); Chronic upper extremity pain (Right); Cervical foraminal stenosis (Right); Chronic cervical radicular pain (Right) (intermittent); Chronic knee pain (Right); Osteoarthritis of knee (Right); COPD exacerbation ([place]); Sinusitis; Elevated C-reactive protein (CRP); Vitamin D deficiency; Localization-related idiopathic epilepsy and epileptic syndromes with seizures of localized onset, not intractable, without status epilepticus ([place]); Musculoskeletal pain; Neuropathic pain; and Marijuana use on  his problem list. His primarily concern today is the Neck Pain  Pain Assessment: Self-Reported Pain Score: 5 /10 Clinically the patient looks like a 3/10 Reported level is inconsistent with clinical observations. Information on the proper use of the pain scale provided to the patient today Pain Location: Neck Pain Orientation: Right, Left Pain Descriptors / Indicators: Numbness, Aching, Radiating Pain Frequency: Constant  Patrick Gregory was last seen on 01/12/2017 for a procedure. During today's appointment we reviewed Patrick Gregory's post-procedure results, as well as his outpatient medication regimen. Today the patient failed to bring his empty bottle or pills to be counted. In addition, his last UDS was positive for THC. We have communicated to the patient that this is incompatible with the use of opioids. We will discontinue the use of such medications for the time being until he can completely stopped the use of marijuana. The patient understood and accepted.  Further details on both, my assessment(s), as well as the proposed treatment plan, please see below.  Controlled Substance Pharmacotherapy Assessment REMS (Risk Evaluation and Mitigation Strategy)  Analgesic: Oxycodone IR 5 mg every 8 hours (15 mg/day of oxycodone) Highest recorded MME/day:61m/day MME/day:22.558mday GaIgnatius SpeckingRN  01/17/2017  2:09 PM  Sign at close encounter Safety precautions to be maintained throughout the outpatient stay will include: orient to surroundings, keep bed in low position, maintain call bell within reach at all times, provide assistance with transfer out of bed and ambulation.    Pharmacokinetics: Liberation and absorption (onset of action): WNL Distribution (time to peak effect): WNL Metabolism and excretion (duration of action): WNL         Pharmacodynamics: Desired effects: Analgesia: Patrick Gregory >50% benefit. Functional ability: Patient reports that medication allows him to accomplish  basic ADLs Clinically meaningful improvement in function (CMIF): Sustained CMIF goals met Perceived effectiveness: Described as relatively effective, allowing for increase in activities of daily living (  ADL) Undesirable effects: Side-effects or Adverse reactions: None reported Monitoring: Patrick Gregory PMP: Online review of the past 86-monthperiod conducted. Compliant with practice rules and regulations List of all UDS test(s) done:  Lab Results  Component Value Date   SUMMARY FINAL 12/13/2016   Last UDS on record: Summary  Date Value Ref Range Status  12/13/2016 FINAL  Final    Comment:    ==================================================================== TOXASSURE SELECT 13 (MW) ==================================================================== Test                             Result       Flag       Units Drug Present and Declared for Prescription Verification   7-aminoclonazepam              254          EXPECTED   ng/mg creat    7-aminoclonazepam is an expected metabolite of clonazepam. Source    of clonazepam is a scheduled prescription medication.   Carboxy-THC                    253          EXPECTED   ng/mg creat    Carboxy-THC is a metabolite of tetrahydrocannabinol  (THC).    [facility] of [facility]is most commonly illicit, but THC is also present    in a scheduled prescription medication. Drug Absent but Declared for Prescription Verification   Oxycodone                      Not Detected UNEXPECTED ng/mg creat ==================================================================== Test                      Result    Flag   Units      Ref Range   Creatinine              81               mg/dL      >=20 ==================================================================== Declared Medications:  The flagging and interpretation on this report are based on the  following declared medications.  Unexpected results may arise from  inaccuracies in the declared medications.  **Note: The testing  scope of this panel includes these medications:  Clonazepam  Marijuana FEW WEEKS ASGO  Oxycodone  **Note: The testing scope of this panel does not include following  reported medications:  Albuterol  Albuterol (Duoneb)  Budenoside (Symbicort)  Calcium Carbonate (Calcarb with Vitamin D3)  Cholecalciferol  Cyclobenzaprine  Fluticasone (Flonase)  Formoterol (Symbicort)  Ipratropium (Duoneb)  Lamotrigine (Lamictal)  Levetiracetam (Keppra)  Lurasidone  Magnesium Oxide  Pantoprazole  Pregabalin (Lyrica)  Ranitidine (Zantac)  Sildenafil (Viagra)  Vitamin D2  Vitamin D3 (Calcarb with Vitamin D3) ==================================================================== For clinical consultation, please call [phone] ====================================================================    UDS interpretation: Non-Compliant The patient was informed that we will not be prescribing any opioids as long as he continues to use of any type of illegal substances. Medication Assessment Form: Reviewed. Patient indicates being compliant with therapy Treatment compliance: Compliant Risk Assessment Profile: Aberrant behavior: use of illicit substances Comorbid factors increasing risk of overdose: See prior notes. No additional risks detected today Risk of substance use disorder (SUD): Very High Opioid Risk Tool (ORT) Total Score: 13  Interpretation Table:  Score <3 = Low Risk for SUD  Score between 4-7 = Moderate Risk for SUD  Score >8 = High Risk for Opioid Abuse   Risk Mitigation Strategies:  Patient Counseling: Covered Patient-Prescriber Agreement (PPA): Present and active  Notification to other healthcare providers: Done  Pharmacologic Plan: We will hold all opioids until they UDS comes back without any THC.  Post-Procedure Assessment  01/12/2017 Procedure: Diagnostic right cervical epidural steroid injection under fluoroscopic guidance and IV sedation Pre-procedure pain score:   7/10 Post-procedure pain score: 0/10 (100% relief) Influential Factors: BMI: 30.27 kg/m Intra-procedural challenges: None observed Assessment challenges: Results reported today are inconsistent with those reported on procedure day, immediately before discharge. Previously the patient had reported 100% relief of the pain, before leaving the facility Post-procedural adverse reactions or complications: None reported Reported side-effects: None  Sedation: Sedation provided. When no sedatives are used, the analgesic levels obtained are directly associated to the effectiveness of the local anesthetics. However, when sedation is provided, the level of analgesia obtained during the initial 1 hour following the intervention, is believed to be the result of a combination of factors. These factors may include, but are not limited to: 1. The effectiveness of the local anesthetics used. 2. The effects of the analgesic(s) and/or anxiolytic(s) used. 3. The degree of discomfort experienced by the patient at the time of the procedure. 4. The patients ability and reliability in recalling and recording the events. 5. The presence and influence of possible secondary gains and/or psychosocial factors. Reported result: Relief experienced during the 1st hour after the procedure: (P) 0 % (Ultra-Short Term Relief) Interpretative annotation: Inaccurate and unreliable report. Patient does not appear to have understood instructions on differential evaluation of treated vs untreated area, leading to an inaccurate global report  Effects of local anesthetic: The analgesic effects attained during this period are directly associated to the localized infiltration of local anesthetics and therefore cary significant diagnostic value as to the etiological location, or anatomical origin, of the pain. Expected duration of relief is directly dependent on the pharmacodynamics of the local anesthetic used. Long-acting (4-6 hours)  anesthetics used.  Reported result: Relief during the next 4 to 6 hour after the procedure: (P) 90 % (states slept well that night and had pain the next day) (Short-Term Relief) Interpretative annotation: Complete relief would suggest area to be the source of the pain.          Long-term benefit: Defined as the period of time past the expected duration of local anesthetics. With the possible exception of prolonged sympathetic blockade from the local anesthetics, benefits during this period are typically attributed to, or associated with, other factors such as analgesic sensory neuropraxia, antiinflammatory effects, or beneficial biochemical changes provided by agents other than the local anesthetics Reported result: Extended relief following procedure: (P) 50 % (left better but needs right side) (Long-Term Relief) Interpretative annotation: Good relief. This could suggest inflammation to be a significant component in the etiology to the pain.          Current benefits: Defined as persistent relief that continues at this point in time.   Reported results: Treated area: 50 %       Interpretative annotation: Ongoing benefits would suggest effective therapeutic approach  Interpretation: Results would suggest that repeating the procedure may be necessary, for therapeutic reasons  Laboratory Chemistry  Inflammation Markers Lab Results  Component Value Date   CRP 2.6 (H) 08/30/2016   ESRSEDRATE 20 08/30/2016   (CRP: Acute Phase) (ESR: Chronic Phase) Renal Function Markers Lab Results  Component Value Date   BUN 8 08/30/2016  CREATININE 1.05 08/30/2016   GFRAA >60 08/30/2016   GFRNONAA >60 08/30/2016   Hepatic Function Markers Lab Results  Component Value Date   AST 22 08/30/2016   ALT 12 (L) 08/30/2016   ALBUMIN 4.0 08/30/2016   ALKPHOS 72 08/30/2016   Electrolytes Lab Results  Component Value Date   NA 138 08/30/2016   K 4.2 08/30/2016   CL 104 08/30/2016   CALCIUM 8.8 (L)  08/30/2016   MG 2.1 08/30/2016   Neuropathy Markers Lab Results  Component Value Date   VITAMINB12 383 06/13/2016   Bone Pathology Markers Lab Results  Component Value Date   ALKPHOS 72 08/30/2016   25OHVITD1 11 (L) 08/30/2016   25OHVITD2 <1.0 08/30/2016   25OHVITD3 11 08/30/2016   CALCIUM 8.8 (L) 08/30/2016   Coagulation Parameters Lab Results  Component Value Date   PLT 293 02/25/2016   Cardiovascular Markers Lab Results  Component Value Date   HGB 16.8 02/25/2016   HCT 47.8 02/25/2016   Note: Lab results reviewed.  Recent Diagnostic Imaging Review  Dg C-arm 1-60 Min-no Report  Result Date: 01/02/2017 Fluoroscopy was utilized by the requesting physician.  No radiographic interpretation.   Note: Imaging results reviewed.          Meds  The patient has a current medication list which includes the following prescription(s): albuterol, budesonide-formoterol, calcium plus d3 absorbable, cholecalciferol, clonazepam, ergocalciferol, fluticasone, ipratropium-albuterol, lamotrigine, levetiracetam, lurasidone hcl, magnesium oxide, pantoprazole, ranitidine, sildenafil, cyclobenzaprine, oxycodone, and pregabalin.  Current Outpatient Prescriptions on File Prior to Visit  Medication Sig  . albuterol (PROVENTIL HFA;VENTOLIN HFA) 108 (90 Base) MCG/ACT inhaler Inhale 2 puffs into the lungs every 6 (six) hours as needed for wheezing or shortness of breath.  . budesonide-formoterol (SYMBICORT) 80-4.5 MCG/ACT inhaler Inhale 2 puffs into the lungs 2 (two) times daily.  . Calcium Carb-Cholecalciferol (CALCIUM PLUS D3 ABSORBABLE) [phone] MG-UNIT CAPS Take 1 capsule by mouth daily with breakfast.  . Cholecalciferol 5000 units capsule Take 1 capsule (5,000 Units total) by mouth daily.  . clonazePAM (KLONOPIN) 1 MG tablet Take 1 mg by mouth 3 (three) times daily as needed.   . ergocalciferol (VITAMIN D2) 50000 units capsule Take 1 capsule (50,000 Units total) by mouth 2 (two) times a week. X 6  weeks.  . fluticasone (FLONASE) 50 MCG/ACT nasal spray Place 2 sprays into both nostrils daily.  Marland Kitchen ipratropium-albuterol (DUONEB) 0.5-2.5 (3) MG/3ML SOLN Take 3 mLs by nebulization 2 (two) times daily.  Marland Kitchen lamoTRIgine (LAMICTAL) 25 MG tablet Take 1 tablet twice a day for 2 weeks, then increase to 2 tablets twice a day  . levETIRAcetam (KEPPRA) 500 MG tablet Take 1 tablet (500 mg total) by mouth 2 (two) times daily.  . Lurasidone HCl 60 MG TABS Take 80 mg by mouth at bedtime.   . Magnesium Oxide 500 MG CAPS Take 1 capsule (500 mg total) by mouth 2 (two) times daily at 8 am and 10 pm.  . pantoprazole (PROTONIX) 40 MG tablet Take 1 tablet (40 mg total) by mouth 2 (two) times daily before a meal.  . ranitidine (ZANTAC) 300 MG tablet Take 300 mg by mouth at bedtime.  . sildenafil (VIAGRA) 100 MG tablet Take 100 mg by mouth daily as needed for erectile dysfunction.  . cyclobenzaprine (FLEXERIL) 10 MG tablet Take 1 tablet (10 mg total) by mouth 3 (three) times daily.  Marland Kitchen oxyCODONE (OXY IR/ROXICODONE) 5 MG immediate release tablet Take 1 tablet (5 mg total) by mouth every 8 (eight) hours  as needed for severe pain.  . pregabalin (LYRICA) 75 MG capsule Take 1 capsule (75 mg total) by mouth 2 (two) times daily.   No current facility-administered medications on file prior to visit.    ROS  Constitutional: Denies any fever or chills Gastrointestinal: No reported hemesis, hematochezia, vomiting, or acute GI distress Musculoskeletal: Denies any acute onset joint swelling, redness, loss of ROM, or weakness Neurological: No reported episodes of acute onset apraxia, aphasia, dysarthria, agnosia, amnesia, paralysis, loss of coordination, or loss of consciousness  Allergies  Mr. Smalls has No Known Allergies.  [place]  Drug: Mr. Whitford  reports that he does not use drugs. Alcohol:  reports that he drinks alcohol. Tobacco:  reports that he quit smoking about 2 years ago. His smoking use included Cigarettes. He has a  35.00 pack-year smoking history. He has never used smokeless tobacco. Medical:  has a past medical history of Allergic rhinitis; Arthritis; Asthma; Bipolar disorder ([place]); Chickenpox; COPD (chronic obstructive pulmonary disease) ([place]); Depression; Low back pain; Seizures ([place]); Sleep apnea; Stiff neck; and Throat cancer ([place]). Family: family history includes Bipolar disorder in his brother and mother; Depression in his brother and mother; Heart disease in his brother and brother.  Past Surgical History:  Procedure Laterality Date  . DIRECT LARYNGOSCOPY Right 10/23/2015   Procedure: MICO DIRECT LARYNGOSCOPY WITH EXCISION RIGHT VOCAL CORD LESION;  Surgeon: Beverly Gust, MD;  Location: [place];  Service: ENT;  Laterality: Right;  . ROTATOR CUFF REPAIR Bilateral 2005, 2013  . THROAT SURGERY     Constitutional Exam  General appearance: Well nourished, well developed, and well hydrated. In no apparent acute distress Vitals:   01/17/17 1356  BP: 127/79  Pulse: (!) 103  Resp: 16  Temp: 98.5 F (36.9 C)  SpO2: 100%  Weight: 205 lb (93 kg)  Height: '5\' 9"'  (1.753 m)   BMI Assessment: Estimated body mass index is 30.27 kg/m as calculated from the following:   Height as of this encounter: '5\' 9"'  (1.753 m).   Weight as of this encounter: 205 lb (93 kg).  BMI interpretation table: BMI level Category Range association with higher incidence of chronic pain  <18 kg/m2 Underweight   18.5-24.9 kg/m2 Ideal body weight   25-29.9 kg/m2 Overweight Increased incidence by 20%  30-34.9 kg/m2 Obese (Class I) Increased incidence by 68%  35-39.9 kg/m2 Severe obesity (Class II) Increased incidence by 136%  >40 kg/m2 Extreme obesity (Class III) Increased incidence by 254%   BMI Readings from Last 4 Encounters:  01/17/17 30.27 kg/m  01/02/17 28.80 kg/m  12/13/16 29.53 kg/m  12/02/16 30.42 kg/m   Wt Readings from Last 4 Encounters:  01/17/17 205 lb (93 kg)  01/02/17 195 lb (88.5 kg)   12/13/16 200 lb (90.7 kg)  12/02/16 206 lb (93.4 kg)  Psych/Mental status: Alert, oriented x 3 (person, place, & time)       Eyes: PERLA Respiratory: No evidence of acute respiratory distress  Cervical Spine Exam  Inspection: No masses, redness, or swelling Alignment: Symmetrical Functional ROM: Improved after treatment      Stability: No instability detected Muscle strength & Tone: Functionally intact Sensory: Unimpaired Palpation: No palpable anomalies              Upper Extremity (UE) Exam    Side: Right upper extremity  Side: Left upper extremity  Inspection: No masses, redness, swelling, or asymmetry. No contractures  Inspection: No masses, redness, swelling, or asymmetry. No contractures  Functional ROM: Unrestricted  ROM          Functional ROM: Unrestricted ROM          Muscle strength & Tone: Functionally intact  Muscle strength & Tone: Functionally intact  Sensory: Unimpaired  Sensory: Unimpaired  Palpation: No palpable anomalies              Palpation: No palpable anomalies              Specialized Test(s): Deferred         Specialized Test(s): Deferred          Thoracic Spine Exam  Inspection: No masses, redness, or swelling Alignment: Symmetrical Functional ROM: Unrestricted ROM Stability: No instability detected Sensory: Unimpaired Muscle strength & Tone: No palpable anomalies  Lumbar Spine Exam  Inspection: No masses, redness, or swelling Alignment: Symmetrical Functional ROM: Unrestricted ROM      Stability: No instability detected Muscle strength & Tone: Functionally intact Sensory: Unimpaired Palpation: No palpable anomalies       Provocative Tests: Lumbar Hyperextension and rotation test: evaluation deferred today       Patrick's Maneuver: evaluation deferred today                    Gait & Posture Assessment  Ambulation: Unassisted Gait: Relatively normal for age and body habitus Posture: WNL   Lower Extremity Exam    Side: Right lower extremity   Side: Left lower extremity  Inspection: No masses, redness, swelling, or asymmetry. No contractures  Inspection: No masses, redness, swelling, or asymmetry. No contractures  Functional ROM: Unrestricted ROM          Functional ROM: Unrestricted ROM          Muscle strength & Tone: Functionally intact  Muscle strength & Tone: Functionally intact  Sensory: Unimpaired  Sensory: Unimpaired  Palpation: No palpable anomalies  Palpation: No palpable anomalies   Assessment  Primary Diagnosis & Pertinent Problem List: The primary encounter diagnosis was Chronic cervical radicular pain (Right) (intermittent). Diagnoses of Chronic upper extremity pain (Right), Cervical foraminal stenosis (Right), Chronic neck pain (Location of Primary Source of Pain) (Bilateral) (R>L), Chronic upper back pain (Location of Secondary source of pain) (Bilateral) (L>R), Chronic low back pain (Location of Tertiary source of pain) (Bilateral) (R>L), Chronic pain syndrome, Long term prescription opiate use, Opiate use (20 MME/Day), and Marijuana use were also pertinent to this visit.  Status Diagnosis  Improving Improving Stable 1. Chronic cervical radicular pain (Right) (intermittent)   2. Chronic upper extremity pain (Right)   3. Cervical foraminal stenosis (Right)   4. Chronic neck pain (Location of Primary Source of Pain) (Bilateral) (R>L)   5. Chronic upper back pain (Location of Secondary source of pain) (Bilateral) (L>R)   6. Chronic low back pain (Location of Tertiary source of pain) (Bilateral) (R>L)   7. Chronic pain syndrome   8. Long term prescription opiate use   9. Opiate use (20 MME/Day)   10. Marijuana use     Problems updated and reviewed during this visit: No problems updated. Plan of Care  Pharmacotherapy (Medications Ordered): No orders of the defined types were placed in this encounter.  New Prescriptions   No medications on file   Medications administered today: Mr. Sereno had no medications  administered during this visit. Lab-work, procedure(s), and/or referral(s): Orders Placed This Encounter  Procedures  . Cervical Epidural Injection  . ToxASSURE Select 13 (MW), Urine   Imaging and/or referral(s): None  Interventional therapies: Planned, scheduled,  and/or pending:   Diagnostic right-sided cervical epidural steroid injection #2 under fluoroscopic guidance    Considering:   Diagnostic right-sided cervical epidural steroid injection under fluoroscopic guidance  Diagnostic bilateral cervical facet block under fluoroscopic guidance  Possible bilateral cervical facet radiofrequency ablation  Bilateral intra-articular shoulder joint injection under fluoroscopic guidance  Diagnostic bilateral suprascapular nerve block under fluoroscopic guidance  Possible bilateral suprascapular nerve radiofrequency ablation  Diagnostic bilateral lumbar facet block under fluoroscopic guidance  Possible bilateral lumbar facet radiofrequency ablation  Diagnostic right-sided intra-articular knee joint injection with local anesthetic and steroid  Diagnostic right-sided genicular nerve blocks under fluoroscopic guidance  Possible right-sided genicular nerve radiofrequency ablation    Palliative PRN treatment(s):   Not at this time.    Provider-requested follow-up: Return for procedure (w/ sedation):, (ASAP), w/ MD.  Future Appointments Date Time Provider [place]  01/30/2017 1:00 PM Milinda Pointer, MD [facility] None  02/03/2017 1:00 PM Cameron Sprang, MD [facility] None   Primary Care Physician: Leone Haven, MD Location: [facility] Outpatient Pain Management Facility Note by: Kathlen Brunswick. Dossie Arbour, M.D, DABA, DABAPM, DABPM, DABIPP, FIPP Date: 01/17/2017; Time: 2:39 PM  Patient instructions provided during this appointment: Patient Instructions    ____________________________________________________________________________________________  Medication Rules  Applies to: All  patients receiving prescriptions (written or electronic).  Pharmacy of record: Pharmacy where electronic prescriptions will be sent. If written prescriptions are taken to a different pharmacy, please inform the nursing staff. The pharmacy listed in the electronic medical record should be the one where you would like electronic prescriptions to be sent.  Prescription refills: Only during scheduled appointments. Applies to both, written and electronic prescriptions.  NOTE: The following applies primarily to controlled substances (Opioid Pain Medications)  Patient's responsibilities: 1. Pain Pills: Bring all pain pills to every appointment (except for procedure appointments). 2. Pill Bottles: Bring pills in original pharmacy bottle. Always bring newest bottle. Bring bottle, even if empty. 3. Medication refills: You are responsible for knowing and keeping track of what medications you need refilled. The day before your appointment, write a list of all prescriptions that need to be refilled. Bring that list to your appointment and give it to the admitting nurse. Prescriptions will be written only during appointments. If you forget a medication, it will not be "Called in", "Faxed", or "electronically sent". You will need to get another appointment to get these prescribed. 4. Prescription Accuracy: You are responsible for carefully inspecting your prescriptions before leaving our office. Have the discharge nurse carefully go over each prescription with you, before taking them home. Make sure that your name is accurately spelled, that your address is correct. Check the name and dose of your medication to make sure it is accurate. Check the number of pills, and the written instructions to make sure they are clear and accurate. Make sure that you are given enough medication to last until your next medication refill appointment. 5. Taking Medication: Take medication as prescribed. Never take more pills than  instructed. Never take medication more frequently than prescribed. Taking less pills or less frequently is permitted and encouraged, when it comes to controlled substances (written prescriptions).  6. Inform other Doctors: Always inform, all of your healthcare providers, of all the medications you take. 7. Pain Medication from other Providers: You are not allowed to accept any additional pain medication from any other Doctor or Healthcare provider. There are two exceptions to this rule. (see below) In the event that you require additional pain medication, you are  responsible for notifying us, as stated below. 8. Medication Agreement: You are responsible for carefully reading and following our Medication Agreement. This must be signed before receiving any prescriptions from our practice. Safely store a copy of your signed Agreement. Violations to the Agreement will result in no further prescriptions. (Additional copies of our Medication Agreement are available upon request.) 9. Laws, Rules, & Regulations: All patients are expected to follow all Federal and [employer], [employer], Rules, [employer] [facility]. Ignorance of the Laws does not constitute a valid excuse.  Exceptions: There are only two exceptions to the rule of not receiving pain medications from other Healthcare Providers. 1. Exception #1 (Emergencies): In the event of an emergency (i.e.: accident requiring emergency care), you are allowed to receive additional pain medication. However, you are responsible for: As soon as you are able, call our office [phone], at any time of the day or night, and leave a message stating your name, the date and nature of the emergency, and the name and dose of the medication prescribed. In the event that your call is answered by a member of our staff, make sure to document and save the date, time, and the name of the person that took your information.  2. Exception #2 (Planned Surgery): In the event that you are  scheduled by another doctor or dentist to have any type of surgery or procedure, you are allowed (for a period no longer than 30 days), to receive additional pain medication, for the acute post-op pain. However, in this case, you are responsible for picking up a copy of our "Post-op Pain Management for Surgeons" handout, and giving it to your surgeon or dentist. This document is available at our office, and does not require an appointment to obtain it. Simply go to our office during business hours (Monday-Thursday from 8:00 AM to 4:00 PM) (Friday 8:00 AM to 12:00 Noon) or if you have a scheduled appointment with [place], prior to your surgery, and ask for it by name. In addition, you will need to provide [place] with your name, name of your surgeon, type of surgery, and date of procedure or surgery.  ____________________________________________________________________________________________ Epidural Steroid Injection Patient Information  Description: The epidural space surrounds the nerves as they exit the spinal cord.  In some patients, the nerves can be compressed and inflamed by a bulging disc or a tight spinal canal (spinal stenosis).  By injecting steroids into the epidural space, we can bring irritated nerves into direct contact with a potentially helpful medication.  These steroids act directly on the irritated nerves and can reduce swelling and inflammation which often leads to decreased pain.  Epidural steroids may be injected anywhere along the spine and from the neck to the low back depending upon the location of your pain.   After numbing the skin with local anesthetic (like Novocaine), a small needle is passed into the epidural space slowly.  You may experience a sensation of pressure while this is being done.  The entire block usually last less than 10 minutes.  Conditions which may be treated by epidural steroids:   Low back and leg pain  Neck and arm pain  Spinal stenosis  Post-laminectomy  syndrome  Herpes zoster (shingles) pain  Pain from compression fractures  Preparation for the injection:  3. Do not eat any solid food or dairy products within 8 hours of your appointment.  4. You may drink clear liquids up to 3 hours before appointment.  Clear liquids include water,  black coffee, juice or soda.  No milk or cream please. 5. You may take your regular medication, including pain medications, with a sip of water before your appointment  Diabetics should hold regular insulin (if taken separately) and take 1/2 normal NPH dos the morning of the procedure.  Carry some sugar containing items with you to your appointment. 6. A driver must accompany you and be prepared to drive you home after your procedure.  7. Bring all your current medications with your. 8. An IV may be inserted and sedation may be given at the discretion of the physician.   9. A blood pressure cuff, EKG and other monitors will often be applied during the procedure.  Some patients may need to have extra oxygen administered for a short period. 10. You will be asked to provide medical information, including your allergies, prior to the procedure.  We must know immediately if you are taking blood thinners (like Coumadin/Warfarin)  Or if you are allergic to IV iodine contrast (dye). We must know if you could possible be pregnant.  Possible side-effects:  Bleeding from needle site  Infection (rare, may require surgery)  Nerve injury (rare)  Numbness & tingling (temporary)  Difficulty urinating (rare, temporary)  Spinal headache ( a headache worse with upright posture)  Light -headedness (temporary)  Pain at injection site (several days)  Decreased blood pressure (temporary)  Weakness in arm/leg (temporary)  Pressure sensation in back/neck (temporary)  Call if you experience:  Fever/chills associated with headache or increased back/neck pain.  Headache worsened by an upright position.  New onset  weakness or numbness of an extremity below the injection site  Hives or difficulty breathing (go to the emergency room)  Inflammation or drainage at the infection site  Severe back/neck pain  Any new symptoms which are concerning to you  Please note:  Although the local anesthetic injected can often make your back or neck feel good for several hours after the injection, the pain will likely return.  It takes 3-7 days for steroids to work in the epidural space.  You may not notice any pain relief for at least that one week.  If effective, we will often do a series of three injections spaced 3-6 weeks apart to maximally decrease your pain.  After the initial series, we generally will wait several months before considering a repeat injection of the same type.  If you have any questions, please call [phone] [place]  What are the risk, side effects and possible complications? Generally speaking, most procedures are safe.  However, with any procedure there are risks, side effects, and the possibility of complications.  The risks and complications are dependent upon the sites that are lesioned, or the type of nerve block to be performed.  The closer the procedure is to the spine, the more serious the risks are.  Great care is taken when placing the radio frequency needles, block needles or lesioning probes, but sometimes complications can occur. 1. Infection: Any time there is an injection through the skin, there is a risk of infection.  This is why sterile conditions are used for these blocks.  There are four possible types of infection. 1. Localized skin infection. 2. Central Nervous System Infection-This can be in the form of Meningitis, which can be deadly. 3. Epidural Infections-This can be in the form of an epidural abscess, which can cause pressure inside of the spine, causing compression of the spinal  cord with  subsequent paralysis. This would require an emergency surgery to decompress, and there are no guarantees that the patient would recover from the paralysis. 4. Discitis-This is an infection of the intervertebral discs.  It occurs in about 1% of discography procedures.  It is difficult to treat and it may lead to surgery.        2. Pain: the needles have to go through skin and soft tissues, will cause soreness.       3. Damage to internal structures:  The nerves to be lesioned may be near blood vessels or    other nerves which can be potentially damaged.       4. Bleeding: Bleeding is more common if the patient is taking blood thinners such as  aspirin, Coumadin, Ticiid, Plavix, etc., or if he/she have some genetic predisposition  such as hemophilia. Bleeding into the spinal canal can cause compression of the spinal  cord with subsequent paralysis.  This would require an emergency surgery to  decompress and there are no guarantees that the patient would recover from the  paralysis.       5. Pneumothorax:  Puncturing of a lung is a possibility, every time a needle is introduced in  the area of the chest or upper back.  Pneumothorax refers to free air around the  collapsed lung(s), inside of the thoracic cavity (chest cavity).  Another two possible  complications related to a similar event would include: Hemothorax and Chylothorax.   These are variations of the Pneumothorax, where instead of air around the collapsed  lung(s), you may have blood or chyle, respectively.       6. Spinal headaches: They may occur with any procedures in the area of the spine.       7. Persistent CSF (Cerebro-Spinal Fluid) leakage: This is a rare problem, but may occur  with prolonged intrathecal or epidural catheters either due to the formation of a fistulous  track or a dural tear.       8. Nerve damage: By working so close to the spinal cord, there is always a possibility of  nerve damage, which could be as serious as a permanent  spinal cord injury with  paralysis.       9. Death:  Although rare, severe deadly allergic reactions known as "Anaphylactic  reaction" can occur to any of the medications used.      10. Worsening of the symptoms:  We can always make thing worse.  What are the chances of something like this happening? Chances of any of this occuring are extremely low.  By statistics, you have more of a chance of getting killed in a motor vehicle accident: while driving to the hospital than any of the above occurring .  Nevertheless, you should be aware that they are possibilities.  In general, it is similar to taking a shower.  Everybody knows that you can slip, hit your head and get killed.  Does that mean that you should not shower again?  Nevertheless always keep in mind that statistics do not mean anything if you happen to be on the wrong side of them.  Even if a procedure has a 1 (one) in a 1,000,000 (million) chance of going wrong, it you happen to be that one..Also, keep in mind that by statistics, you have more of a chance of having something go wrong when taking medications.  Who should not have this procedure? If you are on a blood thinning medication (  e.g. Coumadin, Plavix, see list of "Blood Thinners"), or if you have an active infection going on, you should not have the procedure.  If you are taking any blood thinners, please inform your physician.  How should I prepare for this procedure?  Do not eat or drink anything at least six hours prior to the procedure.  Bring a driver with you .  It cannot be a taxi.  Come accompanied by an adult that can drive you back, and that is strong enough to help you if your legs get weak or numb from the local anesthetic.  Take all of your medicines the morning of the procedure with just enough water to swallow them.  If you have diabetes, make sure that you are scheduled to have your procedure done first thing in the morning, whenever possible.  If you have  diabetes, take only half of your insulin dose and notify our nurse that you have done so as soon as you arrive at the clinic.  If you are diabetic, but only take blood sugar pills (oral hypoglycemic), then do not take them on the morning of your procedure.  You may take them after you have had the procedure.  Do not take aspirin or any aspirin-containing medications, at least eleven (11) days prior to the procedure.  They may prolong bleeding.  Wear loose fitting clothing that may be easy to take off and that you would not mind if it got stained with Betadine or blood.  Do not wear any jewelry or perfume  Remove any nail coloring.  It will interfere with some of our monitoring equipment.  NOTE: Remember that this is not meant to be interpreted as a complete list of all possible complications.  Unforeseen problems may occur.  BLOOD THINNERS The following drugs contain aspirin or other products, which can cause increased bleeding during surgery and should not be taken for 2 weeks prior to and 1 week after surgery.  If you should need take something for relief of minor pain, you may take acetaminophen which is found in Tylenol,m Datril, Anacin-3 and Panadol. It is not blood thinner. The products listed below are.  Do not take any of the products listed below in addition to any listed on your instruction sheet.  A.P.C or A.P.C with Codeine Codeine Phosphate Capsules #3 Ibuprofen Ridaura  ABC compound Congesprin Imuran rimadil  Advil Cope Indocin Robaxisal  Alka-Seltzer Effervescent Pain Reliever and Antacid Coricidin or Coricidin-D  Indomethacin Rufen  Alka-Seltzer plus Cold Medicine Cosprin Ketoprofen S-A-C Tablets  Anacin Analgesic Tablets or Capsules Coumadin Korlgesic Salflex  Anacin Extra Strength Analgesic tablets or capsules CP-2 Tablets Lanoril Salicylate  Anaprox Cuprimine Capsules Levenox Salocol  Anexsia-D Dalteparin Magan Salsalate  Anodynos Darvon compound Magnesium Salicylate  Sine-off  Ansaid Dasin Capsules Magsal Sodium Salicylate  Anturane Depen Capsules Marnal Soma  APF Arthritis pain formula Dewitt's Pills Measurin Stanback  Argesic Dia-Gesic Meclofenamic Sulfinpyrazone  Arthritis Bayer Timed Release Aspirin Diclofenac Meclomen Sulindac  Arthritis pain formula Anacin Dicumarol Medipren Supac  Analgesic (Safety coated) Arthralgen Diffunasal Mefanamic Suprofen  Arthritis Strength Bufferin Dihydrocodeine Mepro Compound Suprol  Arthropan liquid Dopirydamole Methcarbomol with Aspirin Synalgos  ASA tablets/Enseals Disalcid Micrainin Tagament  Ascriptin Doan's Midol Talwin  Ascriptin A/D Dolene Mobidin Tanderil  Ascriptin Extra Strength Dolobid Moblgesic Ticlid  Ascriptin with Codeine Doloprin or Doloprin with Codeine Momentum Tolectin  Asperbuf Duoprin Mono-gesic Trendar  Aspergum Duradyne Motrin or Motrin IB Triminicin  Aspirin plain, buffered or enteric coated Durasal Myochrisine  Trigesic  Aspirin Suppositories Easprin Nalfon Trillsate  Aspirin with Codeine Ecotrin Regular or Extra Strength Naprosyn Uracel  Atromid-S Efficin Naproxen Ursinus  Auranofin Capsules Elmiron Neocylate Vanquish  Axotal Emagrin Norgesic Verin  Azathioprine Empirin or Empirin with Codeine Normiflo Vitamin E  Azolid Emprazil Nuprin Voltaren  Bayer Aspirin plain, buffered or children's or timed BC Tablets or powders Encaprin Orgaran Warfarin Sodium  Buff-a-Comp Enoxaparin Orudis Zorpin  Buff-a-Comp with Codeine Equegesic Os-Cal-Gesic   Buffaprin Excedrin plain, buffered or Extra Strength Oxalid   Bufferin Arthritis Strength Feldene Oxphenbutazone   Bufferin plain or Extra Strength Feldene Capsules Oxycodone with Aspirin   Bufferin with Codeine Fenoprofen Fenoprofen Pabalate or Pabalate-SF   Buffets II Flogesic Panagesic   Buffinol plain or Extra Strength Florinal or Florinal with Codeine Panwarfarin   Buf-Tabs Flurbiprofen Penicillamine   Butalbital Compound Four-way cold tablets  Penicillin   Butazolidin Fragmin Pepto-Bismol   Carbenicillin Geminisyn Percodan   Carna Arthritis Reliever Geopen Persantine   Carprofen Gold's salt Persistin   Chloramphenicol Goody's Phenylbutazone   Chloromycetin Haltrain Piroxlcam   Clmetidine heparin Plaquenil   Cllnoril Hyco-pap Ponstel   Clofibrate Hydroxy chloroquine Propoxyphen         Before stopping any of these medications, be sure to consult the physician who ordered them.  Some, such as Coumadin (Warfarin) are ordered to prevent or treat serious conditions such as "deep thrombosis", "pumonary embolisms", and other heart problems.  The amount of time that you may need off of the medication may also vary with the medication and the reason for which you were taking it.  If you are taking any of these medications, please make sure you notify your pain physician before you undergo any procedures.

## 2017-01-17 ENCOUNTER — Encounter: Payer: Self-pay | Admitting: Pain Medicine

## 2017-01-17 ENCOUNTER — Ambulatory Visit: Payer: Medicare Other | Attending: Pain Medicine | Admitting: Pain Medicine

## 2017-01-17 VITALS — BP 127/79 | HR 103 | Temp 98.5°F | Resp 16 | Ht 69.0 in | Wt 205.0 lb

## 2017-01-17 DIAGNOSIS — F319 Bipolar disorder, unspecified: Secondary | ICD-10-CM | POA: Diagnosis not present

## 2017-01-17 DIAGNOSIS — M9981 Other biomechanical lesions of cervical region: Secondary | ICD-10-CM

## 2017-01-17 DIAGNOSIS — J441 Chronic obstructive pulmonary disease with (acute) exacerbation: Secondary | ICD-10-CM | POA: Diagnosis not present

## 2017-01-17 DIAGNOSIS — G894 Chronic pain syndrome: Secondary | ICD-10-CM

## 2017-01-17 DIAGNOSIS — G8929 Other chronic pain: Secondary | ICD-10-CM

## 2017-01-17 DIAGNOSIS — K219 Gastro-esophageal reflux disease without esophagitis: Secondary | ICD-10-CM | POA: Diagnosis not present

## 2017-01-17 DIAGNOSIS — Z79899 Other long term (current) drug therapy: Secondary | ICD-10-CM | POA: Insufficient documentation

## 2017-01-17 DIAGNOSIS — F4310 Post-traumatic stress disorder, unspecified: Secondary | ICD-10-CM | POA: Diagnosis not present

## 2017-01-17 DIAGNOSIS — E559 Vitamin D deficiency, unspecified: Secondary | ICD-10-CM | POA: Insufficient documentation

## 2017-01-17 DIAGNOSIS — M5412 Radiculopathy, cervical region: Secondary | ICD-10-CM

## 2017-01-17 DIAGNOSIS — G4730 Sleep apnea, unspecified: Secondary | ICD-10-CM | POA: Insufficient documentation

## 2017-01-17 DIAGNOSIS — Z87891 Personal history of nicotine dependence: Secondary | ICD-10-CM | POA: Insufficient documentation

## 2017-01-17 DIAGNOSIS — M549 Dorsalgia, unspecified: Secondary | ICD-10-CM | POA: Diagnosis not present

## 2017-01-17 DIAGNOSIS — Z79891 Long term (current) use of opiate analgesic: Secondary | ICD-10-CM | POA: Diagnosis not present

## 2017-01-17 DIAGNOSIS — M542 Cervicalgia: Secondary | ICD-10-CM | POA: Diagnosis not present

## 2017-01-17 DIAGNOSIS — M4802 Spinal stenosis, cervical region: Secondary | ICD-10-CM | POA: Diagnosis not present

## 2017-01-17 DIAGNOSIS — M79601 Pain in right arm: Secondary | ICD-10-CM

## 2017-01-17 DIAGNOSIS — F1290 Cannabis use, unspecified, uncomplicated: Secondary | ICD-10-CM | POA: Diagnosis not present

## 2017-01-17 DIAGNOSIS — F1190 Opioid use, unspecified, uncomplicated: Secondary | ICD-10-CM | POA: Diagnosis not present

## 2017-01-17 DIAGNOSIS — Z5181 Encounter for therapeutic drug level monitoring: Secondary | ICD-10-CM | POA: Diagnosis not present

## 2017-01-17 DIAGNOSIS — M545 Low back pain: Secondary | ICD-10-CM | POA: Diagnosis not present

## 2017-01-17 NOTE — Progress Notes (Signed)
Safety precautions to be maintained throughout the outpatient stay will include: orient to surroundings, keep bed in low position, maintain call bell within reach at all times, provide assistance with transfer out of bed and ambulation.  

## 2017-01-17 NOTE — Patient Instructions (Addendum)
____________________________________________________________________________________________  Medication Rules  Applies to: All patients receiving prescriptions (written or electronic).  Pharmacy of record: Pharmacy where electronic prescriptions will be sent. If written prescriptions are taken to a different pharmacy, please inform the nursing staff. The pharmacy listed in the electronic medical record should be the one where you would like electronic prescriptions to be sent.  Prescription refills: Only during scheduled appointments. Applies to both, written and electronic prescriptions.  NOTE: The following applies primarily to controlled substances (Opioid Pain Medications)  Patient's responsibilities: 1. Pain Pills: Bring all pain pills to every appointment (except for procedure appointments). 2. Pill Bottles: Bring pills in original pharmacy bottle. Always bring newest bottle. Bring bottle, even if empty. 3. Medication refills: You are responsible for knowing and keeping track of what medications you need refilled. The day before your appointment, write a list of all prescriptions that need to be refilled. Bring that list to your appointment and give it to the admitting nurse. Prescriptions will be written only during appointments. If you forget a medication, it will not be "Called in", "Faxed", or "electronically sent". You will need to get another appointment to get these prescribed. 4. Prescription Accuracy: You are responsible for carefully inspecting your prescriptions before leaving our office. Have the discharge nurse carefully go over each prescription with you, before taking them home. Make sure that your name is accurately spelled, that your address is correct. Check the name and dose of your medication to make sure it is accurate. Check the number of pills, and the written instructions to make sure they are clear and accurate. Make sure that you are given enough medication to last  until your next medication refill appointment. 5. Taking Medication: Take medication as prescribed. Never take more pills than instructed. Never take medication more frequently than prescribed. Taking less pills or less frequently is permitted and encouraged, when it comes to controlled substances (written prescriptions).  6. Inform other Doctors: Always inform, all of your healthcare providers, of all the medications you take. 7. Pain Medication from other Providers: You are not allowed to accept any additional pain medication from any other Doctor or Healthcare provider. There are two exceptions to this rule. (see below) In the event that you require additional pain medication, you are responsible for notifying us, as stated below. 8. Medication Agreement: You are responsible for carefully reading and following our Medication Agreement. This must be signed before receiving any prescriptions from our practice. Safely store a copy of your signed Agreement. Violations to the Agreement will result in no further prescriptions. (Additional copies of our Medication Agreement are available upon request.) 9. Laws, Rules, & Regulations: All patients are expected to follow all Federal and [employer], Rules, [employer]. Ignorance of the Laws does not constitute a valid excuse.  Exceptions: There are only two exceptions to the rule of not receiving pain medications from other Healthcare Providers. 1. Exception #1 (Emergencies): In the event of an emergency (i.e.: accident requiring emergency care), you are allowed to receive additional pain medication. However, you are responsible for: As soon as you are able, call our office [phone], at any time of the day or night, and leave a message stating your name, the date and nature of the emergency, and the name and dose of the medication prescribed. In the event that your call is answered by a member of our staff, make sure to document and save the date,  time, and the name of the person that  took your information.  2. Exception #2 (Planned Surgery): In the event that you are scheduled by another doctor or dentist to have any type of surgery or procedure, you are allowed (for a period no longer than 30 days), to receive additional pain medication, for the acute post-op pain. However, in this case, you are responsible for picking up a copy of our "Post-op Pain Management for Surgeons" handout, and giving it to your surgeon or dentist. This document is available at our office, and does not require an appointment to obtain it. Simply go to our office during business hours (Monday-Thursday from 8:00 AM to 4:00 PM) (Friday 8:00 AM to 12:00 Noon) or if you have a scheduled appointment with [place], prior to your surgery, and ask for it by name. In addition, you will need to provide [place] with your name, name of your surgeon, type of surgery, and date of procedure or surgery.  ____________________________________________________________________________________________ Epidural Steroid Injection Patient Information  Description: The epidural space surrounds the nerves as they exit the spinal cord.  In some patients, the nerves can be compressed and inflamed by a bulging disc or a tight spinal canal (spinal stenosis).  By injecting steroids into the epidural space, we can bring irritated nerves into direct contact with a potentially helpful medication.  These steroids act directly on the irritated nerves and can reduce swelling and inflammation which often leads to decreased pain.  Epidural steroids may be injected anywhere along the spine and from the neck to the low back depending upon the location of your pain.   After numbing the skin with local anesthetic (like Novocaine), a small needle is passed into the epidural space slowly.  You may experience a sensation of pressure while this is being done.  The entire block usually last less than 10 minutes.  Conditions which  may be treated by epidural steroids:   Low back and leg pain  Neck and arm pain  Spinal stenosis  Post-laminectomy syndrome  Herpes zoster (shingles) pain  Pain from compression fractures  Preparation for the injection:  3. Do not eat any solid food or dairy products within 8 hours of your appointment.  4. You may drink clear liquids up to 3 hours before appointment.  Clear liquids include water, black coffee, juice or soda.  No milk or cream please. 5. You may take your regular medication, including pain medications, with a sip of water before your appointment  Diabetics should hold regular insulin (if taken separately) and take 1/2 normal NPH dos the morning of the procedure.  Carry some sugar containing items with you to your appointment. 6. A driver must accompany you and be prepared to drive you home after your procedure.  7. Bring all your current medications with your. 8. An IV may be inserted and sedation may be given at the discretion of the physician.   9. A blood pressure cuff, EKG and other monitors will often be applied during the procedure.  Some patients may need to have extra oxygen administered for a short period. 10. You will be asked to provide medical information, including your allergies, prior to the procedure.  We must know immediately if you are taking blood thinners (like Coumadin/Warfarin)  Or if you are allergic to IV iodine contrast (dye). We must know if you could possible be pregnant.  Possible side-effects:  Bleeding from needle site  Infection (rare, may require surgery)  Nerve injury (rare)  Numbness & tingling (temporary)  Difficulty urinating (rare,  temporary)  Spinal headache ( a headache worse with upright posture)  Light -headedness (temporary)  Pain at injection site (several days)  Decreased blood pressure (temporary)  Weakness in arm/leg (temporary)  Pressure sensation in back/neck (temporary)  Call if you  experience:  Fever/chills associated with headache or increased back/neck pain.  Headache worsened by an upright position.  New onset weakness or numbness of an extremity below the injection site  Hives or difficulty breathing (go to the emergency room)  Inflammation or drainage at the infection site  Severe back/neck pain  Any new symptoms which are concerning to you  Please note:  Although the local anesthetic injected can often make your back or neck feel good for several hours after the injection, the pain will likely return.  It takes 3-7 days for steroids to work in the epidural space.  You may not notice any pain relief for at least that one week.  If effective, we will often do a series of three injections spaced 3-6 weeks apart to maximally decrease your pain.  After the initial series, we generally will wait several months before considering a repeat injection of the same type.  If you have any questions, please call [phone] [place]  What are the risk, side effects and possible complications? Generally speaking, most procedures are safe.  However, with any procedure there are risks, side effects, and the possibility of complications.  The risks and complications are dependent upon the sites that are lesioned, or the type of nerve block to be performed.  The closer the procedure is to the spine, the more serious the risks are.  Great care is taken when placing the radio frequency needles, block needles or lesioning probes, but sometimes complications can occur. 1. Infection: Any time there is an injection through the skin, there is a risk of infection.  This is why sterile conditions are used for these blocks.  There are four possible types of infection. 1. Localized skin infection. 2. Central Nervous System Infection-This can be in the form of Meningitis, which can be deadly. 3. Epidural Infections-This  can be in the form of an epidural abscess, which can cause pressure inside of the spine, causing compression of the spinal cord with subsequent paralysis. This would require an emergency surgery to decompress, and there are no guarantees that the patient would recover from the paralysis. 4. Discitis-This is an infection of the intervertebral discs.  It occurs in about 1% of discography procedures.  It is difficult to treat and it may lead to surgery.        2. Pain: the needles have to go through skin and soft tissues, will cause soreness.       3. Damage to internal structures:  The nerves to be lesioned may be near blood vessels or    other nerves which can be potentially damaged.       4. Bleeding: Bleeding is more common if the patient is taking blood thinners such as  aspirin, Coumadin, Ticiid, Plavix, etc., or if he/she have some genetic predisposition  such as hemophilia. Bleeding into the spinal canal can cause compression of the spinal  cord with subsequent paralysis.  This would require an emergency surgery to  decompress and there are no guarantees that the patient would recover from the  paralysis.       5. Pneumothorax:  Puncturing of a lung is a possibility, every time a needle is  introduced in  the area of the chest or upper back.  Pneumothorax refers to free air around the  collapsed lung(s), inside of the thoracic cavity (chest cavity).  Another two possible  complications related to a similar event would include: Hemothorax and Chylothorax.   These are variations of the Pneumothorax, where instead of air around the collapsed  lung(s), you may have blood or chyle, respectively.       6. Spinal headaches: They may occur with any procedures in the area of the spine.       7. Persistent CSF (Cerebro-Spinal Fluid) leakage: This is a rare problem, but may occur  with prolonged intrathecal or epidural catheters either due to the formation of a fistulous  track or a dural tear.       8. Nerve  damage: By working so close to the spinal cord, there is always a possibility of  nerve damage, which could be as serious as a permanent spinal cord injury with  paralysis.       9. Death:  Although rare, severe deadly allergic reactions known as "Anaphylactic  reaction" can occur to any of the medications used.      10. Worsening of the symptoms:  We can always make thing worse.  What are the chances of something like this happening? Chances of any of this occuring are extremely low.  By statistics, you have more of a chance of getting killed in a motor vehicle accident: while driving to the hospital than any of the above occurring .  Nevertheless, you should be aware that they are possibilities.  In general, it is similar to taking a shower.  Everybody knows that you can slip, hit your head and get killed.  Does that mean that you should not shower again?  Nevertheless always keep in mind that statistics do not mean anything if you happen to be on the wrong side of them.  Even if a procedure has a 1 (one) in a 1,000,000 (million) chance of going wrong, it you happen to be that one..Also, keep in mind that by statistics, you have more of a chance of having something go wrong when taking medications.  Who should not have this procedure? If you are on a blood thinning medication (e.g. Coumadin, Plavix, see list of "Blood Thinners"), or if you have an active infection going on, you should not have the procedure.  If you are taking any blood thinners, please inform your physician.  How should I prepare for this procedure?  Do not eat or drink anything at least six hours prior to the procedure.  Bring a driver with you .  It cannot be a taxi.  Come accompanied by an adult that can drive you back, and that is strong enough to help you if your legs get weak or numb from the local anesthetic.  Take all of your medicines the morning of the procedure with just enough water to swallow them.  If you have  diabetes, make sure that you are scheduled to have your procedure done first thing in the morning, whenever possible.  If you have diabetes, take only half of your insulin dose and notify our nurse that you have done so as soon as you arrive at the clinic.  If you are diabetic, but only take blood sugar pills (oral hypoglycemic), then do not take them on the morning of your procedure.  You may take them after you have had the procedure.  Do not  take aspirin or any aspirin-containing medications, at least eleven (11) days prior to the procedure.  They may prolong bleeding.  Wear loose fitting clothing that may be easy to take off and that you would not mind if it got stained with Betadine or blood.  Do not wear any jewelry or perfume  Remove any nail coloring.  It will interfere with some of our monitoring equipment.  NOTE: Remember that this is not meant to be interpreted as a complete list of all possible complications.  Unforeseen problems may occur.  BLOOD THINNERS The following drugs contain aspirin or other products, which can cause increased bleeding during surgery and should not be taken for 2 weeks prior to and 1 week after surgery.  If you should need take something for relief of minor pain, you may take acetaminophen which is found in Tylenol,m Datril, Anacin-3 and Panadol. It is not blood thinner. The products listed below are.  Do not take any of the products listed below in addition to any listed on your instruction sheet.  A.P.C or A.P.C with Codeine Codeine Phosphate Capsules #3 Ibuprofen Ridaura  ABC compound Congesprin Imuran rimadil  Advil Cope Indocin Robaxisal  Alka-Seltzer Effervescent Pain Reliever and Antacid Coricidin or Coricidin-D  Indomethacin Rufen  Alka-Seltzer plus Cold Medicine Cosprin Ketoprofen S-A-C Tablets  Anacin Analgesic Tablets or Capsules Coumadin Korlgesic Salflex  Anacin Extra Strength Analgesic tablets or capsules CP-2 Tablets Lanoril Salicylate   Anaprox Cuprimine Capsules Levenox Salocol  Anexsia-D Dalteparin Magan Salsalate  Anodynos Darvon compound Magnesium Salicylate Sine-off  Ansaid Dasin Capsules Magsal Sodium Salicylate  Anturane Depen Capsules Marnal Soma  APF Arthritis pain formula Dewitt's Pills Measurin Stanback  Argesic Dia-Gesic Meclofenamic Sulfinpyrazone  Arthritis Bayer Timed Release Aspirin Diclofenac Meclomen Sulindac  Arthritis pain formula Anacin Dicumarol Medipren Supac  Analgesic (Safety coated) Arthralgen Diffunasal Mefanamic Suprofen  Arthritis Strength Bufferin Dihydrocodeine Mepro Compound Suprol  Arthropan liquid Dopirydamole Methcarbomol with Aspirin Synalgos  ASA tablets/Enseals Disalcid Micrainin Tagament  Ascriptin Doan's Midol Talwin  Ascriptin A/D Dolene Mobidin Tanderil  Ascriptin Extra Strength Dolobid Moblgesic Ticlid  Ascriptin with Codeine Doloprin or Doloprin with Codeine Momentum Tolectin  Asperbuf Duoprin Mono-gesic Trendar  Aspergum Duradyne Motrin or Motrin IB Triminicin  Aspirin plain, buffered or enteric coated Durasal Myochrisine Trigesic  Aspirin Suppositories Easprin Nalfon Trillsate  Aspirin with Codeine Ecotrin Regular or Extra Strength Naprosyn Uracel  Atromid-S Efficin Naproxen Ursinus  Auranofin Capsules Elmiron Neocylate Vanquish  Axotal Emagrin Norgesic Verin  Azathioprine Empirin or Empirin with Codeine Normiflo Vitamin E  Azolid Emprazil Nuprin Voltaren  Bayer Aspirin plain, buffered or children's or timed BC Tablets or powders Encaprin Orgaran Warfarin Sodium  Buff-a-Comp Enoxaparin Orudis Zorpin  Buff-a-Comp with Codeine Equegesic Os-Cal-Gesic   Buffaprin Excedrin plain, buffered or Extra Strength Oxalid   Bufferin Arthritis Strength Feldene Oxphenbutazone   Bufferin plain or Extra Strength Feldene Capsules Oxycodone with Aspirin   Bufferin with Codeine Fenoprofen Fenoprofen Pabalate or Pabalate-SF   Buffets II Flogesic Panagesic   Buffinol plain or Extra  Strength Florinal or Florinal with Codeine Panwarfarin   Buf-Tabs Flurbiprofen Penicillamine   Butalbital Compound Four-way cold tablets Penicillin   Butazolidin Fragmin Pepto-Bismol   Carbenicillin Geminisyn Percodan   Carna Arthritis Reliever Geopen Persantine   Carprofen Gold's salt Persistin   Chloramphenicol Goody's Phenylbutazone   Chloromycetin Haltrain Piroxlcam   Clmetidine heparin Plaquenil   Cllnoril Hyco-pap Ponstel   Clofibrate Hydroxy chloroquine Propoxyphen         Before stopping any of  these medications, be sure to consult the physician who ordered them.  Some, such as Coumadin (Warfarin) are ordered to prevent or treat serious conditions such as "deep thrombosis", "pumonary embolisms", and other heart problems.  The amount of time that you may need off of the medication may also vary with the medication and the reason for which you were taking it.  If you are taking any of these medications, please make sure you notify your pain physician before you undergo any procedures.

## 2017-01-20 LAB — TOXASSURE SELECT 13 (MW), URINE

## 2017-01-26 NOTE — Progress Notes (Signed)
THC-DISCLOSED NOTE: This forensic urine drug screen (UDS) test was conducted using a state-of-the-art ultra high performance liquid chromatography and mass spectrometry system (UPLC/MS-MS), the most sophisticated and accurate method available. UPLC/MS-MS is 1,000 times more precise and accurate than standard gas chromatography and mass spectrometry (GC/MS). This system can analyze 26 drug categories and 180 drug compounds.  This patient's UDT results were (+) for Cannabinoids. The patient has been informed of our "Zero Tolerance" towards the use of any illegal substances. Because the patient did disclose the use of cannabinoids at the time of collection, we may not discontinue the posibility of using controlled substances, but we will limit them contingent on the patient's discontinuation of all illegal substances. We will also limit the interval between refills to no more that 30 days, with frequent unanounced UDTs. As long as the quantitative levels are seen to be decreasing, we may continue therapy. The instant they increase or remain the same, we will discontinue the opioid management option. ____________________________________________________________________________________________ Patrick Gregory NOTE: This forensic urine drug screen (UDS) test was conducted using a state-of-the-art ultra high performance liquid chromatography and mass spectrometry system (UPLC/MS-MS), the most sophisticated and accurate method available. UPLC/MS-MS is 1,000 times more precise and accurate than standard gas chromatography and mass spectrometry (GC/MS). This system can analyze 26 drug categories and 180 drug compounds.  Positive, undisclosed use of illicit substance (Cannabinoids). Our program has a "Zero Tolerance" for the use of illicit substances. As a consequence of the results obtained on this test, we will no longer offer controlled substances as a therapeutic option for this patient, until the UDS return clear.  In addition, we will check to see if this is a recurrent event. Today the patient will be given a final warning and informed that should this event happen again, we will no longer continue to offer opioids as an alternative treatment option. In the event that our records reveal that this warning had previously been given to the patient, we will then immediately discontinue this mode of therapy. _

## 2017-01-30 ENCOUNTER — Encounter: Payer: Self-pay | Admitting: Pain Medicine

## 2017-01-30 ENCOUNTER — Ambulatory Visit (HOSPITAL_BASED_OUTPATIENT_CLINIC_OR_DEPARTMENT_OTHER): Payer: Medicare Other | Admitting: Pain Medicine

## 2017-01-30 ENCOUNTER — Ambulatory Visit
Admission: RE | Admit: 2017-01-30 | Discharge: 2017-01-30 | Disposition: A | Discharge status: Home / Self Care | Payer: Medicare Other | Source: Ambulatory Visit | Attending: Pain Medicine | Admitting: Pain Medicine

## 2017-01-30 VITALS — BP 136/86 | HR 77 | Temp 98.0°F | Resp 16 | Ht 69.0 in | Wt 190.0 lb

## 2017-01-30 DIAGNOSIS — M9981 Other biomechanical lesions of cervical region: Secondary | ICD-10-CM | POA: Diagnosis not present

## 2017-01-30 DIAGNOSIS — M542 Cervicalgia: Secondary | ICD-10-CM | POA: Insufficient documentation

## 2017-01-30 DIAGNOSIS — M5412 Radiculopathy, cervical region: Secondary | ICD-10-CM | POA: Diagnosis not present

## 2017-01-30 DIAGNOSIS — M79601 Pain in right arm: Secondary | ICD-10-CM | POA: Diagnosis not present

## 2017-01-30 DIAGNOSIS — M4802 Spinal stenosis, cervical region: Secondary | ICD-10-CM

## 2017-01-30 DIAGNOSIS — G8929 Other chronic pain: Secondary | ICD-10-CM | POA: Insufficient documentation

## 2017-01-30 MED ORDER — DEXAMETHASONE SODIUM PHOSPHATE 10 MG/ML IJ SOLN
10.0000 mg | Freq: Once | INTRAMUSCULAR | Status: AC
  Administered 2017-01-30: 10 mg

## 2017-01-30 MED ORDER — MIDAZOLAM HCL 5 MG/5ML IJ SOLN
1.0000 mg | INTRAMUSCULAR | Status: DC | PRN
  Administered 2017-01-30: 2 mg via INTRAVENOUS

## 2017-01-30 MED ORDER — FENTANYL CITRATE (PF) 100 MCG/2ML IJ SOLN
25.0000 ug | INTRAMUSCULAR | Status: DC | PRN
  Administered 2017-01-30: 50 ug via INTRAVENOUS

## 2017-01-30 MED ORDER — LACTATED RINGERS IV SOLN
1000.0000 mL | Freq: Once | INTRAVENOUS | Status: AC
  Administered 2017-01-30: 1000 mL via INTRAVENOUS

## 2017-01-30 MED ORDER — IOPAMIDOL (ISOVUE-M 200) INJECTION 41%
10.0000 mL | Freq: Once | INTRAMUSCULAR | Status: AC
  Administered 2017-01-30: 10 mL via EPIDURAL

## 2017-01-30 MED ORDER — SODIUM CHLORIDE 0.9 % IJ SOLN
INTRAMUSCULAR | Status: AC
  Filled 2017-01-30: qty 10

## 2017-01-30 MED ORDER — FENTANYL CITRATE (PF) 100 MCG/2ML IJ SOLN
INTRAMUSCULAR | Status: AC
  Filled 2017-01-30: qty 2

## 2017-01-30 MED ORDER — SODIUM CHLORIDE 0.9% FLUSH
1.0000 mL | Freq: Once | INTRAVENOUS | Status: AC
  Administered 2017-01-30: 1 mL

## 2017-01-30 MED ORDER — DEXAMETHASONE SODIUM PHOSPHATE 10 MG/ML IJ SOLN
INTRAMUSCULAR | Status: AC
  Filled 2017-01-30: qty 1

## 2017-01-30 MED ORDER — MIDAZOLAM HCL 5 MG/5ML IJ SOLN
INTRAMUSCULAR | Status: AC
  Filled 2017-01-30: qty 5

## 2017-01-30 MED ORDER — IOPAMIDOL (ISOVUE-M 200) INJECTION 41%
INTRAMUSCULAR | Status: AC
  Filled 2017-01-30: qty 10

## 2017-01-30 MED ORDER — LIDOCAINE HCL (PF) 1 % IJ SOLN
INTRAMUSCULAR | Status: AC
Start: 2017-01-30 — End: ?
  Filled 2017-01-30: qty 5

## 2017-01-30 MED ORDER — ROPIVACAINE HCL 2 MG/ML IJ SOLN
1.0000 mL | Freq: Once | INTRAMUSCULAR | Status: AC
  Administered 2017-01-30: 1 mL via EPIDURAL

## 2017-01-30 MED ORDER — LIDOCAINE HCL (PF) 1 % IJ SOLN
5.0000 mL | Freq: Once | INTRAMUSCULAR | Status: AC
  Administered 2017-01-30: 5 mL

## 2017-01-30 MED ORDER — ROPIVACAINE HCL 2 MG/ML IJ SOLN
INTRAMUSCULAR | Status: AC
  Filled 2017-01-30: qty 10

## 2017-01-30 NOTE — Progress Notes (Signed)
Patient's Name: Patrick Gregory  MRN: [id number]  Referring Provider: Leone Haven, MD  DOB: [birth year]  PCP: Leone Haven, MD  DOS: 01/30/2017  Note by: Kathlen Brunswick. Dossie Arbour, MD  Service setting: Ambulatory outpatient  Specialty: Interventional Pain Management  Patient type: Established  Location: [facility] (AMB) Pain Management Facility  Visit type: Interventional Procedure   Primary Reason for Visit: Interventional Pain Management Treatment. CC: Neck Pain (right)  Procedure:  Anesthesia, Analgesia, Anxiolysis:  Type: Diagnostic, Inter-Laminar, Epidural Steroid Injection Region: Posterior Cervico-thoracic Region Level: C7-T1 Laterality: Right-Sided Paramedial  Type: Local Anesthesia with Moderate (Conscious) Sedation Local Anesthetic: Lidocaine 1% Route: Intravenous (IV) IV Access: Secured Sedation: Meaningful verbal contact was maintained at all times during the procedure  Indication(s): Analgesia and Anxiety  Indications: 1. Chronic cervical radicular pain (Right) (intermittent)   2. Chronic neck pain (Location of Primary Source of Pain) (Bilateral) (R>L)   3. Cervical foraminal stenosis (Right)   4. Chronic upper extremity pain (Right)    Pain Score: Pre-procedure: 8 /10 Post-procedure: 0-No pain/10  Pre-op Assessment:  Previous date of service: 01/17/17 Service provided: Evaluation Patrick Gregory is a 56 y.o. (year old), male patient, seen today for interventional treatment. He  has a past surgical history that includes Throat surgery; Direct laryngoscopy (Right, 10/23/2015); and Rotator cuff repair (Bilateral, 2005, 2013). Patrick Gregory has a current medication list which includes the following prescription(s): albuterol, budesonide-formoterol, calcium plus d3 absorbable, cholecalciferol, clonazepam, fluticasone, ipratropium-albuterol, lamotrigine, levetiracetam, lurasidone hcl, magnesium oxide, pantoprazole, ranitidine, sildenafil, cyclobenzaprine, oxycodone, and pregabalin, and the  following Facility-Administered Medications: fentanyl and midazolam. His primarily concern today is the Neck Pain (right)  Initial Vital Signs: There were no vitals taken for this visit. BMI: 28.06 kg/m  Risk Assessment: Allergies: Reviewed. He has No Known Allergies.  Allergy Precautions: None required Coagulopathies: Reviewed. None identified.  Blood-thinner therapy: None at this time Active Infection(s): Reviewed. None identified. Patrick Gregory is afebrile  Site Confirmation: Patrick Gregory was asked to confirm the procedure and laterality before marking the site Procedure checklist: Completed Consent: Before the procedure and under the influence of no sedative(s), amnesic(s), or anxiolytics, the patient was informed of the treatment options, risks and possible complications. To fulfill our ethical and legal obligations, as recommended by the American Medical Association's Code of Ethics, I have informed the patient of my clinical impression; the nature and purpose of the treatment or procedure; the risks, benefits, and possible complications of the intervention; the alternatives, including doing nothing; the risk(s) and benefit(s) of the alternative treatment(s) or procedure(s); and the risk(s) and benefit(s) of doing nothing. The patient was provided information about the general risks and possible complications associated with the procedure. These may include, but are not limited to: failure to achieve desired goals, infection, bleeding, organ or nerve damage, allergic reactions, paralysis, and death. In addition, the patient was informed of those risks and complications associated to Spine-related procedures, such as failure to decrease pain; infection (i.e.: Meningitis, epidural or intraspinal abscess); bleeding (i.e.: epidural hematoma, subarachnoid hemorrhage, or any other type of intraspinal or peri-dural bleeding); organ or nerve damage (i.e.: Any type of peripheral nerve, nerve root, or spinal  cord injury) with subsequent damage to sensory, motor, and/or autonomic systems, resulting in permanent pain, numbness, and/or weakness of one or several areas of the body; allergic reactions; (i.e.: anaphylactic reaction); and/or death. Furthermore, the patient was informed of those risks and complications associated with the medications. These include, but are not limited to: allergic reactions (i.e.: anaphylactic  or anaphylactoid reaction(s)); adrenal axis suppression; blood sugar elevation that in diabetics may result in ketoacidosis or comma; water retention that in patients with history of congestive heart failure may result in shortness of breath, pulmonary edema, and decompensation with resultant heart failure; weight gain; swelling or edema; medication-induced neural toxicity; particulate matter embolism and blood vessel occlusion with resultant organ, and/or nervous system infarction; and/or aseptic necrosis of one or more joints. Finally, the patient was informed that Medicine is not an exact science; therefore, there is also the possibility of unforeseen or unpredictable risks and/or possible complications that may result in a catastrophic outcome. The patient indicated having understood very clearly. We have given the patient no guarantees and we have made no promises. Enough time was given to the patient to ask questions, all of which were answered to the patient's satisfaction. Patrick Gregory has indicated that he wanted to continue with the procedure. Attestation: I, the ordering provider, attest that I have discussed with the patient the benefits, risks, side-effects, alternatives, likelihood of achieving goals, and potential problems during recovery for the procedure that I have provided informed consent. Date: 01/30/2017; Time: 12:37 PM  Pre-Procedure Preparation:  Monitoring: As per clinic protocol. Respiration, ETCO2, SpO2, BP, heart rate and rhythm monitor placed and checked for adequate  function Safety Precautions: Patient was assessed for positional comfort and pressure points before starting the procedure. Time-out: I initiated and conducted the "Time-out" before starting the procedure, as per protocol. The patient was asked to participate by confirming the accuracy of the "Time Out" information. Verification of the correct person, site, and procedure were performed and confirmed by me, the nursing staff, and the patient. "Time-out" conducted as per Joint Commission's Universal Protocol (UP.01.01.01). "Time-out" Date & Time: 01/30/2017; 1349 hrs.  Description of Procedure Process:   Position: Prone with head of the table was raised to facilitate breathing. Target Area: For Epidural Steroid injections the target is the interlaminar space, initially targeting the lower border of the superior vertebral body lamina. Approach: Paramedial approach. Area Prepped: Entire PosteriorCervical Region Prepping solution: ChloraPrep (2% chlorhexidine gluconate and 70% isopropyl alcohol) Safety Precautions: Aspiration looking for blood return was conducted prior to all injections. At no point did we inject any substances, as a needle was being advanced. No attempts were made at seeking any paresthesias. Safe injection practices and needle disposal techniques used. Medications properly checked for expiration dates. SDV (single dose vial) medications used. Description of the Procedure: Protocol guidelines were followed. The procedure needle was introduced through the skin, ipsilateral to the reported pain, and advanced to the target area. Bone was contacted and the needle walked caudad, until the lamina was cleared. The epidural space was identified using "loss-of-resistance technique" with 2-3 ml of PF-NaCl (0.9% NSS), in a 5cc LOR glass syringe. Vitals:   01/30/17 1359 01/30/17 1404 01/30/17 1414 01/30/17 1422  BP: (!) 132/92 136/86 137/89 136/86  Pulse: 72 77 73 77  Resp: 16 16 18 16   Temp:  98  F (36.7 C)    TempSrc:      SpO2: 99% 98% 96% 98%  Weight:      Height:        Start Time: 1349 hrs. End Time: 1357 hrs. Materials:  Needle(s) Type: Epidural needle Gauge: 17G Length: 3.5-in Medication(s): We administered lactated ringers, midazolam, fentaNYL, iopamidol, dexamethasone, ropivacaine (PF) 2 mg/mL (0.2%), sodium chloride flush, and lidocaine (PF). Please see chart orders for dosing details.  Imaging Guidance (Spinal):  Type of Imaging Technique:  Fluoroscopy Guidance (Spinal) Indication(s): Assistance in needle guidance and placement for procedures requiring needle placement in or near specific anatomical locations not easily accessible without such assistance. Exposure Time: Please see nurses notes. Contrast: Before injecting any contrast, we confirmed that the patient did not have an allergy to iodine, shellfish, or radiological contrast. Once satisfactory needle placement was completed at the desired level, radiological contrast was injected. Contrast injected under live fluoroscopy. No contrast complications. See chart for type and volume of contrast used. Fluoroscopic Guidance: I was personally present during the use of fluoroscopy. "Tunnel Vision Technique" used to obtain the best possible view of the target area. Parallax error corrected before commencing the procedure. "Direction-depth-direction" technique used to introduce the needle under continuous pulsed fluoroscopy. Once target was reached, antero-posterior, oblique, and lateral fluoroscopic projection used confirm needle placement in all planes. Images permanently stored in EMR. Interpretation: I personally interpreted the imaging intraoperatively. Adequate needle placement confirmed in multiple planes. Appropriate spread of contrast into desired area was observed. No evidence of afferent or efferent intravascular uptake. No intrathecal or subarachnoid spread observed. Permanent images saved into the patient's  record.  Antibiotic Prophylaxis:  Indication(s): None identified Antibiotic given: None  Post-operative Assessment:  EBL: None Complications: No immediate post-treatment complications observed by team, or reported by patient. Note: The patient tolerated the entire procedure well. A repeat set of vitals were taken after the procedure and the patient was kept under observation following institutional policy, for this type of procedure. Post-procedural neurological assessment was performed, showing return to baseline, prior to discharge. The patient was provided with post-procedure discharge instructions, including a section on how to identify potential problems. Should any problems arise concerning this procedure, the patient was given instructions to immediately contact us, at any time, without hesitation. In any case, we plan to contact the patient by telephone for a follow-up status report regarding this interventional procedure. Comments:  No additional relevant information.  Plan of Care  Disposition: Discharge home  Discharge Date & Time: 01/30/2017; 1423 hrs.  Physician-requested Follow-up:  Return in about 2 weeks (around 02/13/2017) for procedure (w/ sedation), by MD.  Future Appointments Date Time Provider [place]  02/03/2017 1:00 PM Cameron Sprang, MD [facility] None  02/13/2017 9:00 AM Milinda Pointer, MD [facility] None   Medications ordered for procedure: Meds ordered this encounter  Medications  . lactated ringers infusion 1,000 mL  . midazolam (VERSED) 5 MG/5ML injection 1-2 mg    Make sure Flumazenil is available in the pyxis when using this medication. If oversedation occurs, administer 0.2 mg IV over 15 sec. If after 45 sec no response, administer 0.2 mg again over 1 min; may repeat at 1 min intervals; not to exceed 4 doses (1 mg)  . fentaNYL (SUBLIMAZE) injection 25-50 mcg    Make sure Narcan is available in the pyxis when using this medication. In the event of  respiratory depression (RR< 8/min): Titrate NARCAN (naloxone) in increments of 0.1 to 0.2 mg IV at 2-3 minute intervals, until desired degree of reversal.  . iopamidol (ISOVUE-M) 41 % intrathecal injection 10 mL  . dexamethasone (DECADRON) injection 10 mg  . ropivacaine (PF) 2 mg/mL (0.2%) (NAROPIN) injection 1 mL  . sodium chloride flush (NS) 0.9 % injection 1 mL  . lidocaine (PF) (XYLOCAINE) 1 % injection 5 mL   Medications administered: We administered lactated ringers, midazolam, fentaNYL, iopamidol, dexamethasone, ropivacaine (PF) 2 mg/mL (0.2%), sodium chloride flush, and lidocaine (PF).  See the medical record for  exact dosing, route, and time of administration.  Lab-work, Procedure(s), & Referral(s) Ordered: Orders Placed This Encounter  Procedures  . Cervical Epidural Injection  . Cervical Epidural Injection  . DG C-Arm 1-60 Min-No Report   Imaging Ordered: Results for orders placed in visit on 01/02/17  DG C-Arm 1-60 Min-No Report   Narrative Fluoroscopy was utilized by the requesting physician.  No radiographic  interpretation.    New Prescriptions   No medications on file   Primary Care Physician: Leone Haven, MD Location: [facility] Note by: Kathlen Brunswick. Dossie Arbour, M.D, DABA, DABAPM, DABPM, DABIPP, FIPP Date: 01/30/2017; Time: 4:27 PM  Disclaimer:  Medicine is not an Chief Strategy Officer. The only guarantee in medicine is that nothing is guaranteed. It is important to note that the decision to proceed with this intervention was based on the information collected from the patient. The Data and conclusions were drawn from the patient's questionnaire, the interview, and the physical examination. Because the information was provided in large part by the patient, it cannot be guaranteed that it has not been purposely or unconsciously manipulated. Every effort has been made to obtain as much relevant data as possible for this evaluation. It is  important to note that the conclusions that lead to this procedure are derived in large part from the available data. Always take into account that the treatment will also be dependent on availability of resources and existing treatment guidelines, considered by other Pain Management Practitioners as being common knowledge and practice, at the time of the intervention. For Medico-Legal purposes, it is also important to point out that variation in procedural techniques and pharmacological choices are the acceptable norm. The indications, contraindications, technique, and results of the above procedure should only be interpreted and judged by a Board-Certified Interventional Pain Specialist with extensive familiarity and expertise in the same exact procedure and technique.  Instructions provided at this appointment: Patient Instructions   ____________________________________________________________________________________________You have been scheduled for another procedure at your next visit.  Do not eat or drink for 8 hours, bring a driver and follow any other instructions below.    Post-Procedure instructions Instructions:  Apply ice: Fill a plastic sandwich bag with crushed ice. Cover it with a small towel and apply to injection site. Apply for 15 minutes then remove x 15 minutes. Repeat sequence on day of procedure, until you go to bed. The purpose is to minimize swelling and discomfort after procedure.  Apply heat: Apply heat to procedure site starting the day following the procedure. The purpose is to treat any soreness and discomfort from the procedure.  Food intake: Start with clear liquids (like water) and advance to regular food, as tolerated.   Physical activities: Keep activities to a minimum for the first 8 hours after the procedure.   Driving: If you have received any sedation, you are not allowed to drive for 24 hours after your procedure.  Blood thinner: Restart your blood thinner  6 hours after your procedure. (Only for those taking blood thinners)  Insulin: As soon as you can eat, you may resume your normal dosing schedule. (Only for those taking insulin)  Infection prevention: Keep procedure site clean and dry.  Post-procedure Pain Diary: Extremely important that this be done correctly and accurately. Recorded information will be used to determine the next step in treatment.  Pain evaluated is that of treated area only. Do not include pain from an untreated area.  Complete every hour, on the hour, for the  initial 8 hours. Set an alarm to help you do this part accurately.  Do not go to sleep and have it completed later. It will not be accurate.  Follow-up appointment: Keep your follow-up appointment after the procedure. Usually 2 weeks for most procedures. (6 weeks in the case of radiofrequency.) Bring you pain diary.  Expect:  From numbing medicine (AKA: Local Anesthetics): Numbness or decrease in pain.  Onset: Full effect within 15 minutes of injected.  Duration: It will depend on the type of local anesthetic used. On the average, 1 to 8 hours.   From steroids: Decrease in swelling or inflammation. Once inflammation is improved, relief of the pain will follow.  Onset of benefits: Depends on the amount of swelling present. The more swelling, the longer it will take for the benefits to be seen. In some cases, up to 10 days.  Duration: Steroids will stay in the system x 2 weeks. Duration of benefits will depend on multiple posibilities including persistent irritating factors.  From procedure: Some discomfort is to be expected once the numbing medicine wears off. This should be minimal if ice and heat are applied as instructed. Call if:  You experience numbness and weakness that gets worse with time, as opposed to wearing off.  New onset bowel or bladder incontinence. (Spinal procedures only)  Emergency Numbers:  Durning business hours (Monday - Thursday,  8:00 AM - 4:00 PM) (Friday, 9:00 AM - 12:00 Noon): [phone]  After hours: [phone] ____________________________________________________________________________________________  ____________________________________________________________________________________________  Preparing for Procedure with Sedation Instructions: . Oral Intake: Do not eat or drink anything for at least 8 hours prior to your procedure. . Transportation: Public transportation is not allowed. Bring an adult driver. The driver must be physically present in our waiting room before any procedure can be started. Marland Kitchen Physical Assistance: Bring an adult physically capable of assisting you, in the event you need help. This adult should keep you company at home for at least 6 hours after the procedure. . Blood Pressure Medicine: Take your blood pressure medicine with a sip of water the morning of the procedure. . Blood thinners:  . Diabetics on insulin: Notify the staff so that you can be scheduled 1st case in the morning. If your diabetes requires high dose insulin, take only  of your normal insulin dose the morning of the procedure and notify the staff that you have done so. . Preventing infections: Shower with an antibacterial soap the morning of your procedure. . Build-up your immune system: Take 1000 mg of Vitamin C with every meal (3 times a day) the day prior to your procedure. Marland Kitchen Antibiotics: Inform the staff if you have a condition or reason that requires you to take antibiotics before dental procedures. . Pregnancy: If you are pregnant, call and cancel the procedure. . Sickness: If you have a cold, fever, or any active infections, call and cancel the procedure. . Arrival: You must be in the facility at least 30 minutes prior to your scheduled procedure. . Children: Do not bring children with you. . Dress appropriately: Bring dark clothing that you would not mind if they get stained. . Valuables: Do not bring  any jewelry or valuables. Procedure appointments are reserved for interventional treatments only. Marland Kitchen No Prescription Refills. . No medication changes will be discussed during procedure appointments. . No disability issues will be discussed. ____________________________________________________________________________________________  ____________________________________________________________________________________________  Pain Scale  Introduction: The pain score used by this practice is the Verbal Numerical Rating Scale (  VNRS-11). This is an 11-point scale. It is for adults and children 10 years or older. There are significant differences in how the pain score is reported, used, and applied. Forget everything you learned in the past and learn this scoring system.  General Information: The scale should reflect your current level of pain. Unless you are specifically asked for the level of your worst pain, or your average pain. If you are asked for one of these two, then it should be understood that it is over the past 24 hours.  Basic Activities of Daily Living (ADL): Personal hygiene, dressing, eating, transferring, and using restroom.  Instructions: Most patients tend to report their level of pain as a combination of two factors, their physical pain and their psychosocial pain. This last one is also known as "suffering" and it is reflection of how physical pain affects you socially and psychologically. From now on, report them separately. From this point on, when asked to report your pain level, report only your physical pain. Use the following table for reference.  Pain Clinic Pain Levels (0-5/10)  Pain Level Score  Description  No Pain 0   Mild pain 1 Nagging, annoying, but does not interfere with basic activities of daily living (ADL). Patients are able to eat, bathe, get dressed, toileting (being able to get on and off the toilet and perform personal hygiene functions), transfer (move in  and out of bed or a chair without assistance), and maintain continence (able to control bladder and bowel functions). Blood pressure and heart rate are unaffected. A normal heart rate for a healthy adult ranges from 60 to 100 bpm (beats per minute).   Mild to moderate pain 2 Noticeable and distracting. Impossible to hide from other people. More frequent flare-ups. Still possible to adapt and function close to normal. It can be very annoying and may have occasional stronger flare-ups. With discipline, patients may get used to it and adapt.   Moderate pain 3 Interferes significantly with activities of daily living (ADL). It becomes difficult to feed, bathe, get dressed, get on and off the toilet or to perform personal hygiene functions. Difficult to get in and out of bed or a chair without assistance. Very distracting. With effort, it can be ignored when deeply involved in activities.   Moderately severe pain 4 Impossible to ignore for more than a few minutes. With effort, patients may still be able to manage work or participate in some social activities. Very difficult to concentrate. Signs of autonomic nervous system discharge are evident: dilated pupils (mydriasis); mild sweating (diaphoresis); sleep interference. Heart rate becomes elevated (>115 bpm). Diastolic blood pressure (lower number) rises above 100 mmHg. Patients find relief in laying down and not moving.   Severe pain 5 Intense and extremely unpleasant. Associated with frowning face and frequent crying. Pain overwhelms the senses.  Ability to do any activity or maintain social relationships becomes significantly limited. Conversation becomes difficult. Pacing back and forth is common, as getting into a comfortable position is nearly impossible. Pain wakes you up from deep sleep. Physical signs will be obvious: pupillary dilation; increased sweating; goosebumps; brisk reflexes; cold, clammy hands and feet; nausea, vomiting or dry heaves; loss of  appetite; significant sleep disturbance with inability to fall asleep or to remain asleep. When persistent, significant weight loss is observed due to the complete loss of appetite and sleep deprivation.  Blood pressure and heart rate becomes significantly elevated. Caution: If elevated blood pressure triggers a pounding headache associated with  blurred vision, then the patient should immediately seek attention at an urgent or emergency care unit, as these may be signs of an impending stroke.    Emergency Department Pain Levels (6-10/10)  Emergency Room Pain 6 Severely limiting. Requires emergency care and should not be seen or managed at an outpatient pain management facility. Communication becomes difficult and requires great effort. Assistance to reach the emergency department may be required. Facial flushing and profuse sweating along with potentially dangerous increases in heart rate and blood pressure will be evident.   Distressing pain 7 Self-care is very difficult. Assistance is required to transport, or use restroom. Assistance to reach the emergency department will be required. Tasks requiring coordination, such as bathing and getting dressed become very difficult.   Disabling pain 8 Self-care is no longer possible. At this level, pain is disabling. The individual is unable to do even the most "basic" activities such as walking, eating, bathing, dressing, transferring to a bed, or toileting. Fine motor skills are lost. It is difficult to think clearly.   Incapacitating pain 9 Pain becomes incapacitating. Thought processing is no longer possible. Difficult to remember your own name. Control of movement and coordination are lost.   The worst pain imaginable 10 At this level, most patients pass out from pain. When this level is reached, collapse of the autonomic nervous system occurs, leading to a sudden drop in blood pressure and heart rate. This in turn results in a temporary and dramatic drop  in blood flow to the brain, leading to a loss of consciousness. Fainting is one of the body's self defense mechanisms. Passing out puts the brain in a calmed state and causes it to shut down for a while, in order to begin the healing process.    Summary: 1. Refer to this scale when providing [place] with your pain level. 2. Be accurate and careful when reporting your pain level. This will help with your care. 3. Over-reporting your pain level will lead to loss of credibility. 4. Even a level of 1/10 means that there is pain and will be treated at our facility. 5. High, inaccurate reporting will be documented as "Symptom Exaggeration", leading to loss of credibility and suspicions of possible secondary gains such as obtaining more narcotics, or wanting to appear disabled, for fraudulent reasons. 6. Only pain levels of 5 or below will be seen at our facility. 7. Pain levels of 6 and above will be sent to the Emergency Department and the appointment cancelled. ____________________________________________________________________________________________   Epidural Steroid Injection An epidural steroid injection is given to relieve pain in your neck, back, or legs that is caused by the irritation or swelling of a nerve root. This procedure involves injecting a steroid and numbing medicine (anesthetic) into the epidural space. The epidural space is the space between the outer covering of your spinal cord and the bones that form your backbone (vertebra).  LET [facility] CARE PROVIDER KNOW ABOUT:   Any allergies you have.  All medicines you are taking, including vitamins, herbs, eye drops, creams, and over-the-counter medicines such as aspirin.  Previous problems you or members of your family have had with the use of anesthetics.  Any blood disorders or blood clotting disorders you have.  Previous surgeries you have had.  Medical conditions you have.  RISKS AND COMPLICATIONS Generally, this is a  safe procedure. However, as with any procedure, complications can occur. Possible complications of epidural steroid injection include:  Headache.  Bleeding.  Infection.  Allergic reaction to the medicines.  Damage to your nerves. The response to this procedure depends on the underlying cause of the pain and its duration. People who have long-term (chronic) pain are less likely to benefit from epidural steroids than are those people whose pain comes on strong and suddenly.  BEFORE THE PROCEDURE   Ask your health care provider about changing or stopping your regular medicines. You may be advised to stop taking blood-thinning medicines a few days before the procedure.  You may be given medicines to reduce anxiety.  Arrange for someone to take you home after the procedure.  PROCEDURE   You will remain awake during the procedure. You may receive medicine to make you relaxed.  You will be asked to lie on your stomach.  The injection site will be cleaned.  The injection site will be numbed with a medicine (local anesthetic).  A needle will be injected through your skin into the epidural space.  Your health care provider will use an X-ray machine to ensure that the steroid is delivered closest to the affected nerve. You may have minimal discomfort at this time.  Once the needle is in the right position, the local anesthetic and the steroid will be injected into the epidural space.  The needle will then be removed and a bandage will be applied to the injection site.  AFTER THE PROCEDURE   You may be monitored for a short time before you go home.  You may feel weakness or numbness in your arm or leg, which disappears within hours.  You may be allowed to eat, drink, and take your regular medicine.  You may have soreness at the site of the injection.   This information is not intended to replace advice given to you by your health care provider. Make sure you discuss any questions  you have with your health care provider.   Document Released: 10/25/2007 Document Revised: 03/20/2013 Document Reviewed: 01/04/2013 Elsevier Interactive Patient Education 2016 [place]  What are the risk, side effects and possible complications? Generally speaking, most procedures are safe.  However, with any procedure there are risks, side effects, and the possibility of complications.  The risks and complications are dependent upon the sites that are lesioned, or the type of nerve block to be performed.  The closer the procedure is to the spine, the more serious the risks are.  Great care is taken when placing the radio frequency needles, block needles or lesioning probes, but sometimes complications can occur. Infection: Any time there is an injection through the skin, there is a risk of infection.  This is why sterile conditions are used for these blocks. There are four possible types of infection: 1. Localized skin infection. 2. Central Nervous System Infection: This can be in the form of Meningitis, which can be deadly. 3. Epidural Infections: This can be in the form of an epidural abscess, which can cause pressure inside of the spine, causing compression of the spinal cord with subsequent paralysis. This would require an emergency surgery to decompress, and there are no guarantees that the patient would recover from the paralysis. 4. Discitis: This is an infection of the intervertebral discs. It occurs in about 1% of discography procedures. It is difficult to treat and it may lead to surgery. Pain: the needles have to go through skin and soft tissues, will cause soreness. Damage to internal structures:  The nerves to be lesioned may be near blood vessels  or other nerves which can be potentially damaged. Bleeding: Bleeding is more common if the patient is taking blood thinners such as  aspirin, Coumadin, Ticiid, Plavix, etc., or if he/she have some genetic  predisposition such as hemophilia. Bleeding into the spinal canal can cause compression of the spinal  cord with subsequent paralysis.  This would require an emergency surgery to decompress and there are no guarantees that the patient would recover from the paralysis. Pneumothorax: Puncturing of a lung is a possibility, every time a needle is introduced in the area of the chest or upper back.  Pneumothorax refers to free air around the collapsed lung(s), inside of the thoracic cavity (chest cavity).  Another two possible complications related to a similar event would include: Hemothorax and Chylothorax. These are variations of the Pneumothorax, where instead of air around the collapsed lung(s), you may have blood or chyle, respectively. Spinal headaches: They may occur with any procedures in the area of the spine. Persistent CSF (Cerebro-Spinal Fluid) leakage: This is a rare problem, but may occur with prolonged intrathecal or epidural catheters either due to the formation of a fistulous track or a dural tear. Nerve damage: By working so close to the spinal cord, there is always a possibility of nerve damage, which could be as serious as a permanent spinal cord injury with paralysis. Death: Although rare, severe deadly allergic reactions known as "Anaphylactic reaction" can occur to any of the medications used. Worsening of the symptoms: We can always make thing worse.  What are the chances of something like this happening? Chances of any of this occuring are extremely low.  By statistics, you have more of a chance of getting killed in a motor vehicle accident: while driving to the hospital than any of the above occurring .  Nevertheless, you should be aware that they are possibilities.  In general, it is similar to taking a shower.  Everybody knows that you can slip, hit your head and get killed.  Does that mean that you should not shower again?  Nevertheless always keep in mind that statistics do not mean  anything if you happen to be on the wrong side of them.  Even if a procedure has a 1 (one) in a 1,000,000 (million) chance of going wrong, it you happen to be that one..Also, keep in mind that by statistics, you have more of a chance of having something go wrong when taking medications.  Who should not have this procedure? If you are on a blood thinning medication (e.g. Coumadin, Plavix, see list of "Blood Thinners"), or if you have an active infection going on, you should not have the procedure.  If you are taking any blood thinners, please inform your physician.  Preparing for your procedure: 1. Do not eat or drink anything at least eight (8) hours prior to the procedure. 2. Bring a driver with you .  It cannot be a taxi. 3. Come accompanied by an adult that can drive you back, and that is strong enough to help you if your legs get weak or numb from the local anesthetic. 4. Take all of your medicines the morning of the procedure with just enough water to swallow them. 5. If you have diabetes, make sure that you are scheduled to have your procedure done first thing in the morning, whenever possible. 6. If you have diabetes, take only half of your insulin dose and notify our nurse that you have done so as soon as you arrive at  the clinic. 7. If you are diabetic, but only take blood sugar pills (oral hypoglycemic), then do not take them on the morning of your procedure.  You may take them after you have had the procedure. 8. Do not take aspirin or any aspirin-containing medications, at least eleven (11) days prior to the procedure.  They may prolong bleeding. 9. Wear loose fitting clothing that may be easy to take off and that you would not mind if it got stained with Betadine or blood. 10. Do not wear any jewelry or perfume 11. Remove any nail coloring.  It will interfere with some of our monitoring equipment. 12. If you take Metformin for your diabetes, stop it 48 hours prior to the  procedure.  NOTE: Remember that this is not meant to be interpreted as a complete list of all possible complications.  Unforeseen problems may occur.  BLOOD THINNERS The following drugs contain aspirin or other products, which can cause increased bleeding during surgery and should not be taken for 2 weeks prior to and 1 week after surgery.  If you should need take something for relief of minor pain, you may take acetaminophen which is found in Tylenol,m Datril, Anacin-3 and Panadol. It is not blood thinner. The products listed below are.  Do not take any of the products listed below in addition to any listed on your instruction sheet.  A.P.C or A.P.C with Codeine Codeine Phosphate Capsules #3 Ibuprofen Ridaura  ABC compound Congesprin Imuran rimadil  Advil Cope Indocin Robaxisal  Alka-Seltzer Effervescent Pain Reliever and Antacid Coricidin or Coricidin-D  Indomethacin Rufen  Alka-Seltzer plus Cold Medicine Cosprin Ketoprofen S-A-C Tablets  Anacin Analgesic Tablets or Capsules Coumadin Korlgesic Salflex  Anacin Extra Strength Analgesic tablets or capsules CP-2 Tablets Lanoril Salicylate  Anaprox Cuprimine Capsules Levenox Salocol  Anexsia-D Dalteparin Magan Salsalate  Anodynos Darvon compound Magnesium Salicylate Sine-off  Ansaid Dasin Capsules Magsal Sodium Salicylate  Anturane Depen Capsules Marnal Soma  APF Arthritis pain formula Dewitt's Pills Measurin Stanback  Argesic Dia-Gesic Meclofenamic Sulfinpyrazone  Arthritis Bayer Timed Release Aspirin Diclofenac Meclomen Sulindac  Arthritis pain formula Anacin Dicumarol Medipren Supac  Analgesic (Safety coated) Arthralgen Diffunasal Mefanamic Suprofen  Arthritis Strength Bufferin Dihydrocodeine Mepro Compound Suprol  Arthropan liquid Dopirydamole Methcarbomol with Aspirin Synalgos  ASA tablets/Enseals Disalcid Micrainin Tagament  Ascriptin Doan's Midol Talwin  Ascriptin A/D Dolene Mobidin Tanderil  Ascriptin Extra Strength Dolobid  Moblgesic Ticlid  Ascriptin with Codeine Doloprin or Doloprin with Codeine Momentum Tolectin  Asperbuf Duoprin Mono-gesic Trendar  Aspergum Duradyne Motrin or Motrin IB Triminicin  Aspirin plain, buffered or enteric coated Durasal Myochrisine Trigesic  Aspirin Suppositories Easprin Nalfon Trillsate  Aspirin with Codeine Ecotrin Regular or Extra Strength Naprosyn Uracel  Atromid-S Efficin Naproxen Ursinus  Auranofin Capsules Elmiron Neocylate Vanquish  Axotal Emagrin Norgesic Verin  Azathioprine Empirin or Empirin with Codeine Normiflo Vitamin E  Azolid Emprazil Nuprin Voltaren  Bayer Aspirin plain, buffered or children's or timed BC Tablets or powders Encaprin Orgaran Warfarin Sodium  Buff-a-Comp Enoxaparin Orudis Zorpin  Buff-a-Comp with Codeine Equegesic Os-Cal-Gesic   Buffaprin Excedrin plain, buffered or Extra Strength Oxalid   Bufferin Arthritis Strength Feldene Oxphenbutazone   Bufferin plain or Extra Strength Feldene Capsules Oxycodone with Aspirin   Bufferin with Codeine Fenoprofen Fenoprofen Pabalate or Pabalate-SF   Buffets II Flogesic Panagesic   Buffinol plain or Extra Strength Florinal or Florinal with Codeine Panwarfarin   Buf-Tabs Flurbiprofen Penicillamine   Butalbital Compound Four-way cold tablets Penicillin   Butazolidin Fragmin Pepto-Bismol  Carbenicillin Geminisyn Percodan   Carna Arthritis Reliever Geopen Persantine   Carprofen Gold's salt Persistin   Chloramphenicol Goody's Phenylbutazone   Chloromycetin Haltrain Piroxlcam   Clmetidine heparin Plaquenil   Cllnoril Hyco-pap Ponstel   Clofibrate Hydroxy chloroquine Propoxyphen         Before stopping any of these medications, be sure to consult the physician who ordered them.  Some, such as Coumadin (Warfarin) are ordered to prevent or treat serious conditions such as "deep thrombosis", "pumonary embolisms", and other heart problems.  The amount of time that you may need off of the medication may also vary with  the medication and the reason for which you were taking it.  If you are taking any of these medications, please make sure you notify your pain physician before you undergo any procedures.Preparing for Procedure with Sedation Instructions: . Oral Intake: Do not eat or drink anything for at least 8 hours prior to your procedure. . Transportation: Public transportation is not allowed. Bring an adult driver. The driver must be physically present in our waiting room before any procedure can be started. Marland Kitchen Physical Assistance: Bring an adult capable of physically assisting you, in the event you need help. . Blood Pressure Medicine: Take your blood pressure medicine with a sip of water the morning of the procedure. . Insulin: Take only  of your normal insulin dose. . Preventing infections: Shower with an antibacterial soap the morning of your procedure. . Build-up your immune system: Take 1000 mg of Vitamin C with every meal (3 times a day) the day prior to your procedure. . Pregnancy: If you are pregnant, call and cancel the procedure. . Sickness: If you have a cold, fever, or any active infections, call and cancel the procedure. . Arrival: You must be in the facility at least 30 minutes prior to your scheduled procedure. . Children: Do not bring children with you. . Dress appropriately: Bring dark clothing that you would not mind if they get stained. . Valuables: Do not bring any jewelry or valuables. Procedure appointments are reserved for interventional treatments only. Marland Kitchen No Prescription Refills. . No medication changes will be discussed during procedure appointments. No disability issues will be discussed. Post-Procedure Pain Diary   Name: Date of Service Procedure      Time Period Pain Score Painful Area  Pre-procedure ____/10   Time Period Pain Score Area improved. Area not improved.  15 to 30 min post-procedure ____/10    1st hour after procedure ____/10    2nd hour after procedure  ____/10    3rd hour after procedure ____/10    4th hour after procedure ____/10    5th hour after procedure ____/10    6th hour after procedure ____/10    7th hour after procedure ____/10    Time Period Pain Score Area improved. Area not improved.  Note: From here on, always document your pain score 1st thing in the morning.  1st day after procedure ____/10    2nd day after procedure ____/10    3rd day after procedure ____/10    4th day after procedure ____/10    5th day after procedure ____/10    Time Period Pain Score Area improved. Area not improved.  10th day after procedure ____/10    20th day after procedure ____/10     Benefits Indicate for each set of activities if the procedure changes your ability to accomplish them.  Activity Worse No-Change Improved  Dressing, eating, walking, toileting, hygiene  Shopping, housekeeping, food preparation, community transportation     Range of motion of affected area      Your opinion Please indicate which statement best describes your impression of this treatment.  Statement (X)  Based on the results, I am encouraged.   Based on the results, I am disappointed.   I am not sure I have an opinion at this point.    Note: Make sure to complete and return this form to your physician, on your follow-up appointment. This information will be used to interpret the results. Failure to accurately complete, or to return this information, may result in less than optimal outcomes.

## 2017-01-30 NOTE — Patient Instructions (Addendum)
____________________________________________________________________________________________You have been scheduled for another procedure at your next visit.  Do not eat or drink for 8 hours, bring a driver and follow any other instructions below.    Post-Procedure instructions Instructions:  Apply ice: Fill a plastic sandwich bag with crushed ice. Cover it with a small towel and apply to injection site. Apply for 15 minutes then remove x 15 minutes. Repeat sequence on day of procedure, until you go to bed. The purpose is to minimize swelling and discomfort after procedure.  Apply heat: Apply heat to procedure site starting the day following the procedure. The purpose is to treat any soreness and discomfort from the procedure.  Food intake: Start with clear liquids (like water) and advance to regular food, as tolerated.   Physical activities: Keep activities to a minimum for the first 8 hours after the procedure.   Driving: If you have received any sedation, you are not allowed to drive for 24 hours after your procedure.  Blood thinner: Restart your blood thinner 6 hours after your procedure. (Only for those taking blood thinners)  Insulin: As soon as you can eat, you may resume your normal dosing schedule. (Only for those taking insulin)  Infection prevention: Keep procedure site clean and dry.  Post-procedure Pain Diary: Extremely important that this be done correctly and accurately. Recorded information will be used to determine the next step in treatment.  Pain evaluated is that of treated area only. Do not include pain from an untreated area.  Complete every hour, on the hour, for the initial 8 hours. Set an alarm to help you do this part accurately.  Do not go to sleep and have it completed later. It will not be accurate.  Follow-up appointment: Keep your follow-up appointment after the procedure. Usually 2 weeks for most procedures. (6 weeks in the case of radiofrequency.) Bring  you pain diary.  Expect:  From numbing medicine (AKA: Local Anesthetics): Numbness or decrease in pain.  Onset: Full effect within 15 minutes of injected.  Duration: It will depend on the type of local anesthetic used. On the average, 1 to 8 hours.   From steroids: Decrease in swelling or inflammation. Once inflammation is improved, relief of the pain will follow.  Onset of benefits: Depends on the amount of swelling present. The more swelling, the longer it will take for the benefits to be seen. In some cases, up to 10 days.  Duration: Steroids will stay in the system x 2 weeks. Duration of benefits will depend on multiple posibilities including persistent irritating factors.  From procedure: Some discomfort is to be expected once the numbing medicine wears off. This should be minimal if ice and heat are applied as instructed. Call if:  You experience numbness and weakness that gets worse with time, as opposed to wearing off.  New onset bowel or bladder incontinence. (Spinal procedures only)  Emergency Numbers:  Durning business hours (Monday - Thursday, 8:00 AM - 4:00 PM) (Friday, 9:00 AM - 12:00 Noon): [phone]  After hours: [phone] ____________________________________________________________________________________________  ____________________________________________________________________________________________  Preparing for Procedure with Sedation Instructions: . Oral Intake: Do not eat or drink anything for at least 8 hours prior to your procedure. . Transportation: Public transportation is not allowed. Bring an adult driver. The driver must be physically present in our waiting room before any procedure can be started. Marland Kitchen Physical Assistance: Bring an adult physically capable of assisting you, in the event you need help. This adult should keep you company at  home for at least 6 hours after the procedure. . Blood Pressure Medicine: Take your blood pressure  medicine with a sip of water the morning of the procedure. . Blood thinners:  . Diabetics on insulin: Notify the staff so that you can be scheduled 1st case in the morning. If your diabetes requires high dose insulin, take only  of your normal insulin dose the morning of the procedure and notify the staff that you have done so. . Preventing infections: Shower with an antibacterial soap the morning of your procedure. . Build-up your immune system: Take 1000 mg of Vitamin C with every meal (3 times a day) the day prior to your procedure. Marland Kitchen Antibiotics: Inform the staff if you have a condition or reason that requires you to take antibiotics before dental procedures. . Pregnancy: If you are pregnant, call and cancel the procedure. . Sickness: If you have a cold, fever, or any active infections, call and cancel the procedure. . Arrival: You must be in the facility at least 30 minutes prior to your scheduled procedure. . Children: Do not bring children with you. . Dress appropriately: Bring dark clothing that you would not mind if they get stained. . Valuables: Do not bring any jewelry or valuables. Procedure appointments are reserved for interventional treatments only. Marland Kitchen No Prescription Refills. . No medication changes will be discussed during procedure appointments. . No disability issues will be discussed. ____________________________________________________________________________________________  ____________________________________________________________________________________________  Pain Scale  Introduction: The pain score used by this practice is the Verbal Numerical Rating Scale (VNRS-11). This is an 11-point scale. It is for adults and children 10 years or older. There are significant differences in how the pain score is reported, used, and applied. Forget everything you learned in the past and learn this scoring system.  General Information: The scale should reflect your current  level of pain. Unless you are specifically asked for the level of your worst pain, or your average pain. If you are asked for one of these two, then it should be understood that it is over the past 24 hours.  Basic Activities of Daily Living (ADL): Personal hygiene, dressing, eating, transferring, and using restroom.  Instructions: Most patients tend to report their level of pain as a combination of two factors, their physical pain and their psychosocial pain. This last one is also known as "suffering" and it is reflection of how physical pain affects you socially and psychologically. From now on, report them separately. From this point on, when asked to report your pain level, report only your physical pain. Use the following table for reference.  Pain Clinic Pain Levels (0-5/10)  Pain Level Score  Description  No Pain 0   Mild pain 1 Nagging, annoying, but does not interfere with basic activities of daily living (ADL). Patients are able to eat, bathe, get dressed, toileting (being able to get on and off the toilet and perform personal hygiene functions), transfer (move in and out of bed or a chair without assistance), and maintain continence (able to control bladder and bowel functions). Blood pressure and heart rate are unaffected. A normal heart rate for a healthy adult ranges from 60 to 100 bpm (beats per minute).   Mild to moderate pain 2 Noticeable and distracting. Impossible to hide from other people. More frequent flare-ups. Still possible to adapt and function close to normal. It can be very annoying and may have occasional stronger flare-ups. With discipline, patients may get used to it and adapt.  Moderate pain 3 Interferes significantly with activities of daily living (ADL). It becomes difficult to feed, bathe, get dressed, get on and off the toilet or to perform personal hygiene functions. Difficult to get in and out of bed or a chair without assistance. Very distracting. With effort, it  can be ignored when deeply involved in activities.   Moderately severe pain 4 Impossible to ignore for more than a few minutes. With effort, patients may still be able to manage work or participate in some social activities. Very difficult to concentrate. Signs of autonomic nervous system discharge are evident: dilated pupils (mydriasis); mild sweating (diaphoresis); sleep interference. Heart rate becomes elevated (>115 bpm). Diastolic blood pressure (lower number) rises above 100 mmHg. Patients find relief in laying down and not moving.   Severe pain 5 Intense and extremely unpleasant. Associated with frowning face and frequent crying. Pain overwhelms the senses.  Ability to do any activity or maintain social relationships becomes significantly limited. Conversation becomes difficult. Pacing back and forth is common, as getting into a comfortable position is nearly impossible. Pain wakes you up from deep sleep. Physical signs will be obvious: pupillary dilation; increased sweating; goosebumps; brisk reflexes; cold, clammy hands and feet; nausea, vomiting or dry heaves; loss of appetite; significant sleep disturbance with inability to fall asleep or to remain asleep. When persistent, significant weight loss is observed due to the complete loss of appetite and sleep deprivation.  Blood pressure and heart rate becomes significantly elevated. Caution: If elevated blood pressure triggers a pounding headache associated with blurred vision, then the patient should immediately seek attention at an urgent or emergency care unit, as these may be signs of an impending stroke.    Emergency Department Pain Levels (6-10/10)  Emergency Room Pain 6 Severely limiting. Requires emergency care and should not be seen or managed at an outpatient pain management facility. Communication becomes difficult and requires great effort. Assistance to reach the emergency department may be required. Facial flushing and profuse sweating  along with potentially dangerous increases in heart rate and blood pressure will be evident.   Distressing pain 7 Self-care is very difficult. Assistance is required to transport, or use restroom. Assistance to reach the emergency department will be required. Tasks requiring coordination, such as bathing and getting dressed become very difficult.   Disabling pain 8 Self-care is no longer possible. At this level, pain is disabling. The individual is unable to do even the most "basic" activities such as walking, eating, bathing, dressing, transferring to a bed, or toileting. Fine motor skills are lost. It is difficult to think clearly.   Incapacitating pain 9 Pain becomes incapacitating. Thought processing is no longer possible. Difficult to remember your own name. Control of movement and coordination are lost.   The worst pain imaginable 10 At this level, most patients pass out from pain. When this level is reached, collapse of the autonomic nervous system occurs, leading to a sudden drop in blood pressure and heart rate. This in turn results in a temporary and dramatic drop in blood flow to the brain, leading to a loss of consciousness. Fainting is one of the body's self defense mechanisms. Passing out puts the brain in a calmed state and causes it to shut down for a while, in order to begin the healing process.    Summary: 1. Refer to this scale when providing [place] with your pain level. 2. Be accurate and careful when reporting your pain level. This will help with your care. 3.  Over-reporting your pain level will lead to loss of credibility. 4. Even a level of 1/10 means that there is pain and will be treated at our facility. 5. High, inaccurate reporting will be documented as "Symptom Exaggeration", leading to loss of credibility and suspicions of possible secondary gains such as obtaining more narcotics, or wanting to appear disabled, for fraudulent reasons. 6. Only pain levels of 5 or below will  be seen at our facility. 7. Pain levels of 6 and above will be sent to the Emergency Department and the appointment cancelled. ____________________________________________________________________________________________   Epidural Steroid Injection An epidural steroid injection is given to relieve pain in your neck, back, or legs that is caused by the irritation or swelling of a nerve root. This procedure involves injecting a steroid and numbing medicine (anesthetic) into the epidural space. The epidural space is the space between the outer covering of your spinal cord and the bones that form your backbone (vertebra).  LET [facility] CARE PROVIDER KNOW ABOUT:   Any allergies you have.  All medicines you are taking, including vitamins, herbs, eye drops, creams, and over-the-counter medicines such as aspirin.  Previous problems you or members of your family have had with the use of anesthetics.  Any blood disorders or blood clotting disorders you have.  Previous surgeries you have had.  Medical conditions you have.  RISKS AND COMPLICATIONS Generally, this is a safe procedure. However, as with any procedure, complications can occur. Possible complications of epidural steroid injection include:  Headache.  Bleeding.  Infection.  Allergic reaction to the medicines.  Damage to your nerves. The response to this procedure depends on the underlying cause of the pain and its duration. People who have long-term (chronic) pain are less likely to benefit from epidural steroids than are those people whose pain comes on strong and suddenly.  BEFORE THE PROCEDURE   Ask your health care provider about changing or stopping your regular medicines. You may be advised to stop taking blood-thinning medicines a few days before the procedure.  You may be given medicines to reduce anxiety.  Arrange for someone to take you home after the procedure.  PROCEDURE   You will remain awake during the  procedure. You may receive medicine to make you relaxed.  You will be asked to lie on your stomach.  The injection site will be cleaned.  The injection site will be numbed with a medicine (local anesthetic).  A needle will be injected through your skin into the epidural space.  Your health care provider will use an X-ray machine to ensure that the steroid is delivered closest to the affected nerve. You may have minimal discomfort at this time.  Once the needle is in the right position, the local anesthetic and the steroid will be injected into the epidural space.  The needle will then be removed and a bandage will be applied to the injection site.  AFTER THE PROCEDURE   You may be monitored for a short time before you go home.  You may feel weakness or numbness in your arm or leg, which disappears within hours.  You may be allowed to eat, drink, and take your regular medicine.  You may have soreness at the site of the injection.   This information is not intended to replace advice given to you by your health care provider. Make sure you discuss any questions you have with your health care provider.   Document Released: 10/25/2007 Document Revised: 03/20/2013 Document Reviewed: 01/04/2013  Elsevier Interactive Patient Education 2016 [place]  What are the risk, side effects and possible complications? Generally speaking, most procedures are safe.  However, with any procedure there are risks, side effects, and the possibility of complications.  The risks and complications are dependent upon the sites that are lesioned, or the type of nerve block to be performed.  The closer the procedure is to the spine, the more serious the risks are.  Great care is taken when placing the radio frequency needles, block needles or lesioning probes, but sometimes complications can occur. Infection: Any time there is an injection through the skin, there is a risk  of infection.  This is why sterile conditions are used for these blocks. There are four possible types of infection: 1. Localized skin infection. 2. Central Nervous System Infection: This can be in the form of Meningitis, which can be deadly. 3. Epidural Infections: This can be in the form of an epidural abscess, which can cause pressure inside of the spine, causing compression of the spinal cord with subsequent paralysis. This would require an emergency surgery to decompress, and there are no guarantees that the patient would recover from the paralysis. 4. Discitis: This is an infection of the intervertebral discs. It occurs in about 1% of discography procedures. It is difficult to treat and it may lead to surgery. Pain: the needles have to go through skin and soft tissues, will cause soreness. Damage to internal structures:  The nerves to be lesioned may be near blood vessels or other nerves which can be potentially damaged. Bleeding: Bleeding is more common if the patient is taking blood thinners such as  aspirin, Coumadin, Ticiid, Plavix, etc., or if he/she have some genetic predisposition such as hemophilia. Bleeding into the spinal canal can cause compression of the spinal  cord with subsequent paralysis.  This would require an emergency surgery to decompress and there are no guarantees that the patient would recover from the paralysis. Pneumothorax: Puncturing of a lung is a possibility, every time a needle is introduced in the area of the chest or upper back.  Pneumothorax refers to free air around the collapsed lung(s), inside of the thoracic cavity (chest cavity).  Another two possible complications related to a similar event would include: Hemothorax and Chylothorax. These are variations of the Pneumothorax, where instead of air around the collapsed lung(s), you may have blood or chyle, respectively. Spinal headaches: They may occur with any procedures in the area of the spine. Persistent CSF  (Cerebro-Spinal Fluid) leakage: This is a rare problem, but may occur with prolonged intrathecal or epidural catheters either due to the formation of a fistulous track or a dural tear. Nerve damage: By working so close to the spinal cord, there is always a possibility of nerve damage, which could be as serious as a permanent spinal cord injury with paralysis. Death: Although rare, severe deadly allergic reactions known as "Anaphylactic reaction" can occur to any of the medications used. Worsening of the symptoms: We can always make thing worse.  What are the chances of something like this happening? Chances of any of this occuring are extremely low.  By statistics, you have more of a chance of getting killed in a motor vehicle accident: while driving to the hospital than any of the above occurring .  Nevertheless, you should be aware that they are possibilities.  In general, it is similar to taking a shower.  Everybody knows that you can slip,  hit your head and get killed.  Does that mean that you should not shower again?  Nevertheless always keep in mind that statistics do not mean anything if you happen to be on the wrong side of them.  Even if a procedure has a 1 (one) in a 1,000,000 (million) chance of going wrong, it you happen to be that one..Also, keep in mind that by statistics, you have more of a chance of having something go wrong when taking medications.  Who should not have this procedure? If you are on a blood thinning medication (e.g. Coumadin, Plavix, see list of "Blood Thinners"), or if you have an active infection going on, you should not have the procedure.  If you are taking any blood thinners, please inform your physician.  Preparing for your procedure: 1. Do not eat or drink anything at least eight (8) hours prior to the procedure. 2. Bring a driver with you .  It cannot be a taxi. 3. Come accompanied by an adult that can drive you back, and that is strong enough to help you if  your legs get weak or numb from the local anesthetic. 4. Take all of your medicines the morning of the procedure with just enough water to swallow them. 5. If you have diabetes, make sure that you are scheduled to have your procedure done first thing in the morning, whenever possible. 6. If you have diabetes, take only half of your insulin dose and notify our nurse that you have done so as soon as you arrive at the clinic. 7. If you are diabetic, but only take blood sugar pills (oral hypoglycemic), then do not take them on the morning of your procedure.  You may take them after you have had the procedure. 8. Do not take aspirin or any aspirin-containing medications, at least eleven (11) days prior to the procedure.  They may prolong bleeding. 9. Wear loose fitting clothing that may be easy to take off and that you would not mind if it got stained with Betadine or blood. 10. Do not wear any jewelry or perfume 11. Remove any nail coloring.  It will interfere with some of our monitoring equipment. 12. If you take Metformin for your diabetes, stop it 48 hours prior to the procedure.  NOTE: Remember that this is not meant to be interpreted as a complete list of all possible complications.  Unforeseen problems may occur.  BLOOD THINNERS The following drugs contain aspirin or other products, which can cause increased bleeding during surgery and should not be taken for 2 weeks prior to and 1 week after surgery.  If you should need take something for relief of minor pain, you may take acetaminophen which is found in Tylenol,m Datril, Anacin-3 and Panadol. It is not blood thinner. The products listed below are.  Do not take any of the products listed below in addition to any listed on your instruction sheet.  A.P.C or A.P.C with Codeine Codeine Phosphate Capsules #3 Ibuprofen Ridaura  ABC compound Congesprin Imuran rimadil  Advil Cope Indocin Robaxisal  Alka-Seltzer Effervescent Pain Reliever and Antacid  Coricidin or Coricidin-D  Indomethacin Rufen  Alka-Seltzer plus Cold Medicine Cosprin Ketoprofen S-A-C Tablets  Anacin Analgesic Tablets or Capsules Coumadin Korlgesic Salflex  Anacin Extra Strength Analgesic tablets or capsules CP-2 Tablets Lanoril Salicylate  Anaprox Cuprimine Capsules Levenox Salocol  Anexsia-D Dalteparin Magan Salsalate  Anodynos Darvon compound Magnesium Salicylate Sine-off  Ansaid Dasin Capsules Magsal Sodium Salicylate  Anturane Depen Capsules Marnal Soma  APF Arthritis pain formula Dewitt's Pills Measurin Stanback  Argesic Dia-Gesic Meclofenamic Sulfinpyrazone  Arthritis Bayer Timed Release Aspirin Diclofenac Meclomen Sulindac  Arthritis pain formula Anacin Dicumarol Medipren Supac  Analgesic (Safety coated) Arthralgen Diffunasal Mefanamic Suprofen  Arthritis Strength Bufferin Dihydrocodeine Mepro Compound Suprol  Arthropan liquid Dopirydamole Methcarbomol with Aspirin Synalgos  ASA tablets/Enseals Disalcid Micrainin Tagament  Ascriptin Doan's Midol Talwin  Ascriptin A/D Dolene Mobidin Tanderil  Ascriptin Extra Strength Dolobid Moblgesic Ticlid  Ascriptin with Codeine Doloprin or Doloprin with Codeine Momentum Tolectin  Asperbuf Duoprin Mono-gesic Trendar  Aspergum Duradyne Motrin or Motrin IB Triminicin  Aspirin plain, buffered or enteric coated Durasal Myochrisine Trigesic  Aspirin Suppositories Easprin Nalfon Trillsate  Aspirin with Codeine Ecotrin Regular or Extra Strength Naprosyn Uracel  Atromid-S Efficin Naproxen Ursinus  Auranofin Capsules Elmiron Neocylate Vanquish  Axotal Emagrin Norgesic Verin  Azathioprine Empirin or Empirin with Codeine Normiflo Vitamin E  Azolid Emprazil Nuprin Voltaren  Bayer Aspirin plain, buffered or children's or timed BC Tablets or powders Encaprin Orgaran Warfarin Sodium  Buff-a-Comp Enoxaparin Orudis Zorpin  Buff-a-Comp with Codeine Equegesic Os-Cal-Gesic   Buffaprin Excedrin plain, buffered or Extra Strength Oxalid    Bufferin Arthritis Strength Feldene Oxphenbutazone   Bufferin plain or Extra Strength Feldene Capsules Oxycodone with Aspirin   Bufferin with Codeine Fenoprofen Fenoprofen Pabalate or Pabalate-SF   Buffets II Flogesic Panagesic   Buffinol plain or Extra Strength Florinal or Florinal with Codeine Panwarfarin   Buf-Tabs Flurbiprofen Penicillamine   Butalbital Compound Four-way cold tablets Penicillin   Butazolidin Fragmin Pepto-Bismol   Carbenicillin Geminisyn Percodan   Carna Arthritis Reliever Geopen Persantine   Carprofen Gold's salt Persistin   Chloramphenicol Goody's Phenylbutazone   Chloromycetin Haltrain Piroxlcam   Clmetidine heparin Plaquenil   Cllnoril Hyco-pap Ponstel   Clofibrate Hydroxy chloroquine Propoxyphen         Before stopping any of these medications, be sure to consult the physician who ordered them.  Some, such as Coumadin (Warfarin) are ordered to prevent or treat serious conditions such as "deep thrombosis", "pumonary embolisms", and other heart problems.  The amount of time that you may need off of the medication may also vary with the medication and the reason for which you were taking it.  If you are taking any of these medications, please make sure you notify your pain physician before you undergo any procedures.Preparing for Procedure with Sedation Instructions: . Oral Intake: Do not eat or drink anything for at least 8 hours prior to your procedure. . Transportation: Public transportation is not allowed. Bring an adult driver. The driver must be physically present in our waiting room before any procedure can be started. [facility] Physical Assistance: Bring an adult capable of physically assisting you, in the event you need help. . Blood Pressure Medicine: Take your blood pressure medicine with a sip of water the morning of the procedure. . Insulin: Take only  of your normal insulin dose. . Preventing infections: Shower with an antibacterial soap the morning of your  procedure. . Build-up your immune system: Take 1000 mg of Vitamin C with every meal (3 times a day) the day prior to your procedure. . Pregnancy: If you are pregnant, call and cancel the procedure. . Sickness: If you have a cold, fever, or any active infections, call and cancel the procedure. . Arrival: You must be in the facility at least 30 minutes prior to your scheduled procedure. . Children: Do not bring children with you. . Dress appropriately: Bring dark clothing that  you would not mind if they get stained. . Valuables: Do not bring any jewelry or valuables. Procedure appointments are reserved for interventional treatments only. [facility] No Prescription Refills. . No medication changes will be discussed during procedure appointments. No disability issues will be discussed. Post-Procedure Pain Diary   Name: Date of Service Procedure      Time Period Pain Score Painful Area  Pre-procedure ____/10   Time Period Pain Score Area improved. Area not improved.  15 to 30 min post-procedure ____/10    1st hour after procedure ____/10    2nd hour after procedure ____/10    3rd hour after procedure ____/10    4th hour after procedure ____/10    5th hour after procedure ____/10    6th hour after procedure ____/10    7th hour after procedure ____/10    Time Period Pain Score Area improved. Area not improved.  Note: From here on, always document your pain score 1st thing in the morning.  1st day after procedure ____/10    2nd day after procedure ____/10    3rd day after procedure ____/10    4th day after procedure ____/10    5th day after procedure ____/10    Time Period Pain Score Area improved. Area not improved.  10th day after procedure ____/10    20th day after procedure ____/10     Benefits Indicate for each set of activities if the procedure changes your ability to accomplish them.  Activity Worse No-Change Improved  Dressing, eating, walking, toileting, hygiene     Shopping,  housekeeping, food preparation, community transportation     Range of motion of affected area      Your opinion Please indicate which statement best describes your impression of this treatment.  Statement (X)  Based on the results, I am encouraged.   Based on the results, I am disappointed.   I am not sure I have an opinion at this point.    Note: Make sure to complete and return this form to your physician, on your follow-up appointment. This information will be used to interpret the results. Failure to accurately complete, or to return this information, may result in less than optimal outcomes.

## 2017-01-31 ENCOUNTER — Telehealth: Payer: Self-pay

## 2017-01-31 NOTE — Telephone Encounter (Signed)
Post procedure phone call.  Patient states he is doing ok today.   

## 2017-02-03 ENCOUNTER — Ambulatory Visit: Payer: Medicare Other | Admitting: Neurology

## 2017-02-13 ENCOUNTER — Ambulatory Visit (HOSPITAL_BASED_OUTPATIENT_CLINIC_OR_DEPARTMENT_OTHER): Payer: Medicare Other | Admitting: Pain Medicine

## 2017-02-13 ENCOUNTER — Ambulatory Visit
Admission: RE | Admit: 2017-02-13 | Discharge: 2017-02-13 | Disposition: A | Discharge status: Home / Self Care | Payer: Medicare Other | Source: Ambulatory Visit | Attending: Pain Medicine | Admitting: Pain Medicine

## 2017-02-13 ENCOUNTER — Encounter: Payer: Self-pay | Admitting: Pain Medicine

## 2017-02-13 VITALS — BP 132/87 | HR 79 | Temp 98.7°F | Resp 18 | Ht 69.0 in | Wt 198.0 lb

## 2017-02-13 DIAGNOSIS — M25511 Pain in right shoulder: Secondary | ICD-10-CM

## 2017-02-13 DIAGNOSIS — M542 Cervicalgia: Secondary | ICD-10-CM

## 2017-02-13 DIAGNOSIS — M4802 Spinal stenosis, cervical region: Secondary | ICD-10-CM

## 2017-02-13 DIAGNOSIS — M25512 Pain in left shoulder: Secondary | ICD-10-CM

## 2017-02-13 DIAGNOSIS — G8929 Other chronic pain: Secondary | ICD-10-CM

## 2017-02-13 DIAGNOSIS — M79601 Pain in right arm: Secondary | ICD-10-CM

## 2017-02-13 DIAGNOSIS — M5412 Radiculopathy, cervical region: Secondary | ICD-10-CM | POA: Insufficient documentation

## 2017-02-13 DIAGNOSIS — M9981 Other biomechanical lesions of cervical region: Secondary | ICD-10-CM

## 2017-02-13 DIAGNOSIS — M549 Dorsalgia, unspecified: Secondary | ICD-10-CM

## 2017-02-13 MED ORDER — MIDAZOLAM HCL 5 MG/5ML IJ SOLN
INTRAMUSCULAR | Status: AC
  Filled 2017-02-13: qty 5

## 2017-02-13 MED ORDER — IOPAMIDOL (ISOVUE-M 200) INJECTION 41%
10.0000 mL | Freq: Once | INTRAMUSCULAR | Status: DC
Start: 2017-02-13 — End: 2017-02-13

## 2017-02-13 MED ORDER — FENTANYL CITRATE (PF) 100 MCG/2ML IJ SOLN
INTRAMUSCULAR | Status: AC
  Filled 2017-02-13: qty 2

## 2017-02-13 MED ORDER — DEXAMETHASONE SODIUM PHOSPHATE 10 MG/ML IJ SOLN
10.0000 mg | Freq: Once | INTRAMUSCULAR | Status: AC
  Administered 2017-02-13: 10 mg

## 2017-02-13 MED ORDER — LIDOCAINE HCL (PF) 1 % IJ SOLN
5.0000 mL | Freq: Once | INTRAMUSCULAR | Status: AC
  Administered 2017-02-13: 5 mL

## 2017-02-13 MED ORDER — ROPIVACAINE HCL 2 MG/ML IJ SOLN
INTRAMUSCULAR | Status: AC
  Filled 2017-02-13: qty 10

## 2017-02-13 MED ORDER — FENTANYL CITRATE (PF) 100 MCG/2ML IJ SOLN
25.0000 ug | Freq: Once | INTRAMUSCULAR | Status: AC
  Administered 2017-02-13: 50 ug via INTRAVENOUS

## 2017-02-13 MED ORDER — DEXAMETHASONE SODIUM PHOSPHATE 10 MG/ML IJ SOLN
INTRAMUSCULAR | Status: AC
  Filled 2017-02-13: qty 1

## 2017-02-13 MED ORDER — ROPIVACAINE HCL 2 MG/ML IJ SOLN
1.0000 mL | Freq: Once | INTRAMUSCULAR | Status: AC
  Administered 2017-02-13: 1 mL via EPIDURAL

## 2017-02-13 MED ORDER — MIDAZOLAM HCL 5 MG/5ML IJ SOLN
1.0000 mg | Freq: Once | INTRAMUSCULAR | Status: AC
  Administered 2017-02-13: 2 mg via INTRAVENOUS

## 2017-02-13 MED ORDER — LIDOCAINE HCL (PF) 1 % IJ SOLN
INTRAMUSCULAR | Status: AC
  Filled 2017-02-13: qty 5

## 2017-02-13 MED ORDER — SODIUM CHLORIDE 0.9% FLUSH
1.0000 mL | Freq: Once | INTRAVENOUS | Status: AC
  Administered 2017-02-13: 1 mL

## 2017-02-13 MED ORDER — LACTATED RINGERS IV SOLN: 1000.0000 mL | Freq: Once | INTRAVENOUS | Status: DC

## 2017-02-13 MED ORDER — IOPAMIDOL (ISOVUE-M 200) INJECTION 41%
INTRAMUSCULAR | Status: AC
  Filled 2017-02-13: qty 10

## 2017-02-13 MED ORDER — SODIUM CHLORIDE 0.9 % IJ SOLN
INTRAMUSCULAR | Status: AC
  Filled 2017-02-13: qty 20

## 2017-02-13 NOTE — Progress Notes (Signed)
Patient's Name: Patrick Gregory  MRN: [id number]  Referring Provider: Milinda Pointer, MD  DOB: 1961-06-30  PCP: Leone Haven, MD  DOS: 02/13/2017  Note by: Gaspar Cola, MD  Service setting: Ambulatory outpatient  Specialty: Interventional Pain Management  Patient type: Established  Location: [facility] (AMB) Pain Management Facility  Visit type: Interventional Procedure   Primary Reason for Visit: Interventional Pain Management Treatment. CC: Neck Pain (back of the neck)  Procedure:  Anesthesia, Analgesia, Anxiolysis:  Type: Therapeutic, Inter-Laminar, Epidural Steroid #3 Injection Region: Posterior Cervico-thoracic Region Level: C7-T1 Laterality: Right-Sided Paramedial  Type: Local Anesthesia with Moderate (Conscious) Sedation Local Anesthetic: Lidocaine 1% Route: Intravenous (IV) IV Access: Secured Sedation: Meaningful verbal contact was maintained at all times during the procedure  Indication(s): Analgesia and Anxiety  Indications: 1. Chronic cervical radicular pain (Right) (intermittent)   2. Chronic upper extremity pain (Right)   3. Chronic neck pain (Location of Primary Source of Pain) (Bilateral) (R>L)   4. Chronic shoulder pain (Bilateral) (R>L)   5. Chronic upper back pain (Location of Secondary source of pain) (Bilateral) (L>R)   6. Cervical foraminal stenosis (Right)    Pain Score: Pre-procedure: 5 /10 Post-procedure: 0-No pain/10  Pre-op Assessment:  Previous date of service: 01/30/17 Service provided: Procedure Patrick Gregory is a 56 y.o. (year old), male patient, seen today for interventional treatment. He  has a past surgical history that includes Throat surgery; Direct laryngoscopy (Right, 10/23/2015); and Rotator cuff repair (Bilateral, 2005, 2013). Patrick Gregory has a current medication list which includes the following prescription(s): albuterol, budesonide-formoterol, calcium plus d3 absorbable, cholecalciferol, clonazepam, fluticasone, ipratropium-albuterol,  lamotrigine, levetiracetam, lurasidone hcl, magnesium oxide, pantoprazole, ranitidine, sildenafil, cyclobenzaprine, and pregabalin, and the following Facility-Administered Medications: iopamidol and lactated ringers. His primarily concern today is the Neck Pain (back of the neck)  Initial Vital Signs: There were no vitals taken for this visit. BMI: 29.24 kg/m  Risk Assessment: Allergies: Reviewed. He has No Known Allergies.  Allergy Precautions: None required Coagulopathies: Reviewed. None identified.  Blood-thinner therapy: None at this time Active Infection(s): Reviewed. None identified. Patrick Gregory is afebrile  Site Confirmation: Patrick Gregory was asked to confirm the procedure and laterality before marking the site Procedure checklist: Completed Consent: Before the procedure and under the influence of no sedative(s), amnesic(s), or anxiolytics, the patient was informed of the treatment options, risks and possible complications. To fulfill our ethical and legal obligations, as recommended by the American Medical Association's Code of Ethics, I have informed the patient of my clinical impression; the nature and purpose of the treatment or procedure; the risks, benefits, and possible complications of the intervention; the alternatives, including doing nothing; the risk(s) and benefit(s) of the alternative treatment(s) or procedure(s); and the risk(s) and benefit(s) of doing nothing. The patient was provided information about the general risks and possible complications associated with the procedure. These may include, but are not limited to: failure to achieve desired goals, infection, bleeding, organ or nerve damage, allergic reactions, paralysis, and death. In addition, the patient was informed of those risks and complications associated to Spine-related procedures, such as failure to decrease pain; infection (i.e.: Meningitis, epidural or intraspinal abscess); bleeding (i.e.: epidural hematoma,  subarachnoid hemorrhage, or any other type of intraspinal or peri-dural bleeding); organ or nerve damage (i.e.: Any type of peripheral nerve, nerve root, or spinal cord injury) with subsequent damage to sensory, motor, and/or autonomic systems, resulting in permanent pain, numbness, and/or weakness of one or several areas of the body; allergic reactions; (i.e.:  anaphylactic reaction); and/or death. Furthermore, the patient was informed of those risks and complications associated with the medications. These include, but are not limited to: allergic reactions (i.e.: anaphylactic or anaphylactoid reaction(s)); adrenal axis suppression; blood sugar elevation that in diabetics may result in ketoacidosis or comma; water retention that in patients with history of congestive heart failure may result in shortness of breath, pulmonary edema, and decompensation with resultant heart failure; weight gain; swelling or edema; medication-induced neural toxicity; particulate matter embolism and blood vessel occlusion with resultant organ, and/or nervous system infarction; and/or aseptic necrosis of one or more joints. Finally, the patient was informed that Medicine is not an exact science; therefore, there is also the possibility of unforeseen or unpredictable risks and/or possible complications that may result in a catastrophic outcome. The patient indicated having understood very clearly. We have given the patient no guarantees and we have made no promises. Enough time was given to the patient to ask questions, all of which were answered to the patient's satisfaction. Patrick Gregory has indicated that he wanted to continue with the procedure. Attestation: I, the ordering provider, attest that I have discussed with the patient the benefits, risks, side-effects, alternatives, likelihood of achieving goals, and potential problems during recovery for the procedure that I have provided informed consent. Date: 02/13/2017; Time: 7:51  AM  Pre-Procedure Preparation:  Monitoring: As per clinic protocol. Respiration, ETCO2, SpO2, BP, heart rate and rhythm monitor placed and checked for adequate function Safety Precautions: Patient was assessed for positional comfort and pressure points before starting the procedure. Time-out: I initiated and conducted the "Time-out" before starting the procedure, as per protocol. The patient was asked to participate by confirming the accuracy of the "Time Out" information. Verification of the correct person, site, and procedure were performed and confirmed by me, the nursing staff, and the patient. "Time-out" conducted as per Joint Commission's Universal Protocol (UP.01.01.01). "Time-out" Date & Time: 02/13/2017; 0900 hrs.  Description of Procedure Process:   Position: Prone with head of the table was raised to facilitate breathing. Target Area: For Epidural Steroid injections the target is the interlaminar space, initially targeting the lower border of the superior vertebral body lamina. Approach: Paramedial approach. Area Prepped: Entire PosteriorCervical Region Prepping solution: ChloraPrep (2% chlorhexidine gluconate and 70% isopropyl alcohol) Safety Precautions: Aspiration looking for blood return was conducted prior to all injections. At no point did we inject any substances, as a needle was being advanced. No attempts were made at seeking any paresthesias. Safe injection practices and needle disposal techniques used. Medications properly checked for expiration dates. SDV (single dose vial) medications used. Description of the Procedure: Protocol guidelines were followed. The procedure needle was introduced through the skin, ipsilateral to the reported pain, and advanced to the target area. Bone was contacted and the needle walked caudad, until the lamina was cleared. The epidural space was identified using "loss-of-resistance technique" with 2-3 ml of PF-NaCl (0.9% NSS), in a 5cc LOR glass  syringe. Vitals:   02/13/17 0910 02/13/17 0915 02/13/17 0925 02/13/17 0935  BP: 140/79 140/79 118/83 132/87  Pulse: 84 78 82 79  Resp: 15 16 12 18   Temp:      SpO2: 97% 99% 95% 96%  Weight:      Height:        Start Time: 0900 hrs. End Time: 0912 hrs. Materials:  Needle(s) Type: Epidural needle Gauge: 17G Length: 3.5-in Medication(s): We administered midazolam, fentaNYL, dexamethasone, ropivacaine (PF) 2 mg/mL (0.2%), sodium chloride flush, and lidocaine (PF).  Please see chart orders for dosing details.  Imaging Guidance (Spinal):  Type of Imaging Technique: Fluoroscopy Guidance (Spinal) Indication(s): Assistance in needle guidance and placement for procedures requiring needle placement in or near specific anatomical locations not easily accessible without such assistance. Exposure Time: Please see nurses notes. Contrast: Before injecting any contrast, we confirmed that the patient did not have an allergy to iodine, shellfish, or radiological contrast. Once satisfactory needle placement was completed at the desired level, radiological contrast was injected. Contrast injected under live fluoroscopy. No contrast complications. See chart for type and volume of contrast used. Fluoroscopic Guidance: I was personally present during the use of fluoroscopy. "Tunnel Vision Technique" used to obtain the best possible view of the target area. Parallax error corrected before commencing the procedure. "Direction-depth-direction" technique used to introduce the needle under continuous pulsed fluoroscopy. Once target was reached, antero-posterior, oblique, and lateral fluoroscopic projection used confirm needle placement in all planes. Images permanently stored in EMR. Interpretation: I personally interpreted the imaging intraoperatively. Adequate needle placement confirmed in multiple planes. Appropriate spread of contrast into desired area was observed. No evidence of afferent or efferent intravascular  uptake. No intrathecal or subarachnoid spread observed. Permanent images saved into the patient's record.  Antibiotic Prophylaxis:  Indication(s): None identified Antibiotic given: None  Post-operative Assessment:  EBL: None Complications: No immediate post-treatment complications observed by team, or reported by patient. Note: The patient tolerated the entire procedure well. A repeat set of vitals were taken after the procedure and the patient was kept under observation following institutional policy, for this type of procedure. Post-procedural neurological assessment was performed, showing return to baseline, prior to discharge. The patient was provided with post-procedure discharge instructions, including a section on how to identify potential problems. Should any problems arise concerning this procedure, the patient was given instructions to immediately contact us, at any time, without hesitation. In any case, we plan to contact the patient by telephone for a follow-up status report regarding this interventional procedure. Comments:  No additional relevant information.  Plan of Care  Disposition: Discharge home  Discharge Date & Time: 02/13/2017; 0942 hrs.  Physician-requested Follow-up:  Return for post-procedure eval (in 2 wks).  Future Appointments Date Time Provider [place]  02/14/2017 3:30 PM Cameron Sprang, MD [facility] None  03/06/2017 9:00 AM Milinda Pointer, MD [facility] None   Medications ordered for procedure: Meds ordered this encounter  Medications  . lactated ringers infusion 1,000 mL  . midazolam (VERSED) 5 MG/5ML injection 1-2 mg    Make sure Flumazenil is available in the pyxis when using this medication. If oversedation occurs, administer 0.2 mg IV over 15 sec. If after 45 sec no response, administer 0.2 mg again over 1 min; may repeat at 1 min intervals; not to exceed 4 doses (1 mg)  . fentaNYL (SUBLIMAZE) injection 25-50 mcg    Make sure Narcan is  available in the pyxis when using this medication. In the event of respiratory depression (RR< 8/min): Titrate NARCAN (naloxone) in increments of 0.1 to 0.2 mg IV at 2-3 minute intervals, until desired degree of reversal.  . iopamidol (ISOVUE-M) 41 % intrathecal injection 10 mL  . dexamethasone (DECADRON) injection 10 mg  . ropivacaine (PF) 2 mg/mL (0.2%) (NAROPIN) injection 1 mL  . sodium chloride flush (NS) 0.9 % injection 1 mL  . lidocaine (PF) (XYLOCAINE) 1 % injection 5 mL   Medications administered: We administered midazolam, fentaNYL, dexamethasone, ropivacaine (PF) 2 mg/mL (0.2%), sodium chloride flush, and lidocaine (  PF).  See the medical record for exact dosing, route, and time of administration.  Lab-work, Procedure(s), & Referral(s) Ordered: Orders Placed This Encounter  Procedures  . Cervical Epidural Injection  . DG C-Arm 1-60 Min-No Report  . Informed Consent Details: Transcribe to consent form and obtain patient signature  . Provider attestation of informed consent for procedure/surgical case  . Verify informed consent  . Discharge instructions  . Follow-up   Imaging Ordered: Results for orders placed in visit on 01/30/17  DG C-Arm 1-60 Min-No Report   Narrative Fluoroscopy was utilized by the requesting physician.  No radiographic  interpretation.    New Prescriptions   No medications on file   Primary Care Physician: Leone Haven, MD Location: [facility] Note by: Gaspar Cola, MD Date: 02/13/2017; Time: 10:27 AM  Disclaimer:  Medicine is not an exact science. The only guarantee in medicine is that nothing is guaranteed. It is important to note that the decision to proceed with this intervention was based on the information collected from the patient. The Data and conclusions were drawn from the patient's questionnaire, the interview, and the physical examination. Because the information was provided in large part by the  patient, it cannot be guaranteed that it has not been purposely or unconsciously manipulated. Every effort has been made to obtain as much relevant data as possible for this evaluation. It is important to note that the conclusions that lead to this procedure are derived in large part from the available data. Always take into account that the treatment will also be dependent on availability of resources and existing treatment guidelines, considered by other Pain Management Practitioners as being common knowledge and practice, at the time of the intervention. For Medico-Legal purposes, it is also important to point out that variation in procedural techniques and pharmacological choices are the acceptable norm. The indications, contraindications, technique, and results of the above procedure should only be interpreted and judged by a Board-Certified Interventional Pain Specialist with extensive familiarity and expertise in the same exact procedure and technique.  Instructions provided at this appointment: Patient Instructions   ____________________________________________________________________________________________  Post-Procedure instructions Instructions:  Apply ice: Fill a plastic sandwich bag with crushed ice. Cover it with a small towel and apply to injection site. Apply for 15 minutes then remove x 15 minutes. Repeat sequence on day of procedure, until you go to bed. The purpose is to minimize swelling and discomfort after procedure.  Apply heat: Apply heat to procedure site starting the day following the procedure. The purpose is to treat any soreness and discomfort from the procedure.  Food intake: Start with clear liquids (like water) and advance to regular food, as tolerated.   Physical activities: Keep activities to a minimum for the first 8 hours after the procedure.   Driving: If you have received any sedation, you are not allowed to drive for 24 hours after your procedure.  Blood  thinner: Restart your blood thinner 6 hours after your procedure. (Only for those taking blood thinners)  Insulin: As soon as you can eat, you may resume your normal dosing schedule. (Only for those taking insulin)  Infection prevention: Keep procedure site clean and dry.  Post-procedure Pain Diary: Extremely important that this be done correctly and accurately. Recorded information will be used to determine the next step in treatment.  Pain evaluated is that of treated area only. Do not include pain from an untreated area.  Complete every hour, on the hour,  for the initial 8 hours. Set an alarm to help you do this part accurately.  Do not go to sleep and have it completed later. It will not be accurate.  Follow-up appointment: Keep your follow-up appointment after the procedure. Usually 2 weeks for most procedures. (6 weeks in the case of radiofrequency.) Bring you pain diary.  Expect:  From numbing medicine (AKA: Local Anesthetics): Numbness or decrease in pain.  Onset: Full effect within 15 minutes of injected.  Duration: It will depend on the type of local anesthetic used. On the average, 1 to 8 hours.   From steroids: Decrease in swelling or inflammation. Once inflammation is improved, relief of the pain will follow.  Onset of benefits: Depends on the amount of swelling present. The more swelling, the longer it will take for the benefits to be seen. In some cases, up to 10 days.  Duration: Steroids will stay in the system x 2 weeks. Duration of benefits will depend on multiple posibilities including persistent irritating factors.  From procedure: Some discomfort is to be expected once the numbing medicine wears off. This should be minimal if ice and heat are applied as instructed. Call if:  You experience numbness and weakness that gets worse with time, as opposed to wearing off.  New onset bowel or bladder incontinence. (Spinal procedures only)  Emergency Numbers:  Durning  business hours (Monday - Thursday, 8:00 AM - 4:00 PM) (Friday, 9:00 AM - 12:00 Noon): [phone]  After hours: [phone] ____________________________________________________________________________________________  Pain Management Discharge Instructions  General Discharge Instructions :  If you need to reach your doctor call: Monday-Friday 8:00 am - 4:00 pm at [phone] or toll free [phone].  After clinic hours [phone] to have operator reach doctor.  Bring all of your medication bottles to all your appointments in the pain clinic.  To cancel or reschedule your appointment with Pain Management please remember to call 24 hours in advance to avoid a fee.  Refer to the educational materials which you have been given on: General Risks, I had my Procedure. Discharge Instructions, Post Sedation.  Post Procedure Instructions:  The drugs you were given will stay in your system until tomorrow, so for the next 24 hours you should not drive, make any legal decisions or drink any alcoholic beverages.  You may eat anything you prefer, but it is better to start with liquids then soups and crackers, and gradually work up to solid foods.  Please notify your doctor immediately if you have any unusual bleeding, trouble breathing or pain that is not related to your normal pain.  Depending on the type of procedure that was done, some parts of your body may feel week and/or numb.  This usually clears up by tonight or the next day.  Walk with the use of an assistive device or accompanied by an adult for the 24 hours.  You may use ice on the affected area for the first 24 hours.  Put ice in a Ziploc bag and cover with a towel and place against area 15 minutes on 15 minutes off.  You may switch to heat after 24 hours.GENERAL RISKS AND COMPLICATIONS  What are the risk, side effects and possible complications? Generally speaking, most procedures are safe.  However, with any procedure  there are risks, side effects, and the possibility of complications.  The risks and complications are dependent upon the sites that are lesioned, or the type of nerve block to be performed.  The closer the  procedure is to the spine, the more serious the risks are.  Great care is taken when placing the radio frequency needles, block needles or lesioning probes, but sometimes complications can occur. 1. Infection: Any time there is an injection through the skin, there is a risk of infection.  This is why sterile conditions are used for these blocks.  There are four possible types of infection. 1. Localized skin infection. 2. Central Nervous System Infection-This can be in the form of Meningitis, which can be deadly. 3. Epidural Infections-This can be in the form of an epidural abscess, which can cause pressure inside of the spine, causing compression of the spinal cord with subsequent paralysis. This would require an emergency surgery to decompress, and there are no guarantees that the patient would recover from the paralysis. 4. Discitis-This is an infection of the intervertebral discs.  It occurs in about 1% of discography procedures.  It is difficult to treat and it may lead to surgery.        2. Pain: the needles have to go through skin and soft tissues, will cause soreness.       3. Damage to internal structures:  The nerves to be lesioned may be near blood vessels or    other nerves which can be potentially damaged.       4. Bleeding: Bleeding is more common if the patient is taking blood thinners such as  aspirin, Coumadin, Ticiid, Plavix, etc., or if he/she have some genetic predisposition  such as hemophilia. Bleeding into the spinal canal can cause compression of the spinal  cord with subsequent paralysis.  This would require an emergency surgery to  decompress and there are no guarantees that the patient would recover from the  paralysis.       5. Pneumothorax:  Puncturing of a lung is a  possibility, every time a needle is introduced in  the area of the chest or upper back.  Pneumothorax refers to free air around the  collapsed lung(s), inside of the thoracic cavity (chest cavity).  Another two possible  complications related to a similar event would include: Hemothorax and Chylothorax.   These are variations of the Pneumothorax, where instead of air around the collapsed  lung(s), you may have blood or chyle, respectively.       6. Spinal headaches: They may occur with any procedures in the area of the spine.       7. Persistent CSF (Cerebro-Spinal Fluid) leakage: This is a rare problem, but may occur  with prolonged intrathecal or epidural catheters either due to the formation of a fistulous  track or a dural tear.       8. Nerve damage: By working so close to the spinal cord, there is always a possibility of  nerve damage, which could be as serious as a permanent spinal cord injury with  paralysis.       9. Death:  Although rare, severe deadly allergic reactions known as "Anaphylactic  reaction" can occur to any of the medications used.      10. Worsening of the symptoms:  We can always make thing worse.  What are the chances of something like this happening? Chances of any of this occuring are extremely low.  By statistics, you have more of a chance of getting killed in a motor vehicle accident: while driving to the hospital than any of the above occurring .  Nevertheless, you should be aware that they are possibilities.  In general,  it is similar to taking a shower.  Everybody knows that you can slip, hit your head and get killed.  Does that mean that you should not shower again?  Nevertheless always keep in mind that statistics do not mean anything if you happen to be on the wrong side of them.  Even if a procedure has a 1 (one) in a 1,000,000 (million) chance of going wrong, it you happen to be that one..Also, keep in mind that by statistics, you have more of a chance of having  something go wrong when taking medications.  Who should not have this procedure? If you are on a blood thinning medication (e.g. Coumadin, Plavix, see list of "Blood Thinners"), or if you have an active infection going on, you should not have the procedure.  If you are taking any blood thinners, please inform your physician.  How should I prepare for this procedure?  Do not eat or drink anything at least six hours prior to the procedure.  Bring a driver with you .  It cannot be a taxi.  Come accompanied by an adult that can drive you back, and that is strong enough to help you if your legs get weak or numb from the local anesthetic.  Take all of your medicines the morning of the procedure with just enough water to swallow them.  If you have diabetes, make sure that you are scheduled to have your procedure done first thing in the morning, whenever possible.  If you have diabetes, take only half of your insulin dose and notify our nurse that you have done so as soon as you arrive at the clinic.  If you are diabetic, but only take blood sugar pills (oral hypoglycemic), then do not take them on the morning of your procedure.  You may take them after you have had the procedure.  Do not take aspirin or any aspirin-containing medications, at least eleven (11) days prior to the procedure.  They may prolong bleeding.  Wear loose fitting clothing that may be easy to take off and that you would not mind if it got stained with Betadine or blood.  Do not wear any jewelry or perfume  Remove any nail coloring.  It will interfere with some of our monitoring equipment.  NOTE: Remember that this is not meant to be interpreted as a complete list of all possible complications.  Unforeseen problems may occur.  BLOOD THINNERS The following drugs contain aspirin or other products, which can cause increased bleeding during surgery and should not be taken for 2 weeks prior to and 1 week after surgery.  If you  should need take something for relief of minor pain, you may take acetaminophen which is found in Tylenol,m Datril, Anacin-3 and Panadol. It is not blood thinner. The products listed below are.  Do not take any of the products listed below in addition to any listed on your instruction sheet.  A.P.C or A.P.C with Codeine Codeine Phosphate Capsules #3 Ibuprofen Ridaura  ABC compound Congesprin Imuran rimadil  Advil Cope Indocin Robaxisal  Alka-Seltzer Effervescent Pain Reliever and Antacid Coricidin or Coricidin-D  Indomethacin Rufen  Alka-Seltzer plus Cold Medicine Cosprin Ketoprofen S-A-C Tablets  Anacin Analgesic Tablets or Capsules Coumadin Korlgesic Salflex  Anacin Extra Strength Analgesic tablets or capsules CP-2 Tablets Lanoril Salicylate  Anaprox Cuprimine Capsules Levenox Salocol  Anexsia-D Dalteparin Magan Salsalate  Anodynos Darvon compound Magnesium Salicylate Sine-off  Ansaid Dasin Capsules Magsal Sodium Salicylate  Anturane Depen Capsules Marnal Soma  APF Arthritis pain formula Dewitt's Pills Measurin Stanback  Argesic Dia-Gesic Meclofenamic Sulfinpyrazone  Arthritis Bayer Timed Release Aspirin Diclofenac Meclomen Sulindac  Arthritis pain formula Anacin Dicumarol Medipren Supac  Analgesic (Safety coated) Arthralgen Diffunasal Mefanamic Suprofen  Arthritis Strength Bufferin Dihydrocodeine Mepro Compound Suprol  Arthropan liquid Dopirydamole Methcarbomol with Aspirin Synalgos  ASA tablets/Enseals Disalcid Micrainin Tagament  Ascriptin Doan's Midol Talwin  Ascriptin A/D Dolene Mobidin Tanderil  Ascriptin Extra Strength Dolobid Moblgesic Ticlid  Ascriptin with Codeine Doloprin or Doloprin with Codeine Momentum Tolectin  Asperbuf Duoprin Mono-gesic Trendar  Aspergum Duradyne Motrin or Motrin IB Triminicin  Aspirin plain, buffered or enteric coated Durasal Myochrisine Trigesic  Aspirin Suppositories Easprin Nalfon Trillsate  Aspirin with Codeine Ecotrin Regular or Extra Strength  Naprosyn Uracel  Atromid-S Efficin Naproxen Ursinus  Auranofin Capsules Elmiron Neocylate Vanquish  Axotal Emagrin Norgesic Verin  Azathioprine Empirin or Empirin with Codeine Normiflo Vitamin E  Azolid Emprazil Nuprin Voltaren  Bayer Aspirin plain, buffered or children's or timed BC Tablets or powders Encaprin Orgaran Warfarin Sodium  Buff-a-Comp Enoxaparin Orudis Zorpin  Buff-a-Comp with Codeine Equegesic Os-Cal-Gesic   Buffaprin Excedrin plain, buffered or Extra Strength Oxalid   Bufferin Arthritis Strength Feldene Oxphenbutazone   Bufferin plain or Extra Strength Feldene Capsules Oxycodone with Aspirin   Bufferin with Codeine Fenoprofen Fenoprofen Pabalate or Pabalate-SF   Buffets II Flogesic Panagesic   Buffinol plain or Extra Strength Florinal or Florinal with Codeine Panwarfarin   Buf-Tabs Flurbiprofen Penicillamine   Butalbital Compound Four-way cold tablets Penicillin   Butazolidin Fragmin Pepto-Bismol   Carbenicillin Geminisyn Percodan   Carna Arthritis Reliever Geopen Persantine   Carprofen Gold's salt Persistin   Chloramphenicol Goody's Phenylbutazone   Chloromycetin Haltrain Piroxlcam   Clmetidine heparin Plaquenil   Cllnoril Hyco-pap Ponstel   Clofibrate Hydroxy chloroquine Propoxyphen         Before stopping any of these medications, be sure to consult the physician who ordered them.  Some, such as Coumadin (Warfarin) are ordered to prevent or treat serious conditions such as "deep thrombosis", "pumonary embolisms", and other heart problems.  The amount of time that you may need off of the medication may also vary with the medication and the reason for which you were taking it.  If you are taking any of these medications, please make sure you notify your pain physician before you undergo any procedures.          Epidural Steroid Injection An epidural steroid injection is a shot of steroid medicine and numbing medicine that is given into the space between the  spinal cord and the bones in your back (epidural space). The shot helps relieve pain caused by an irritated or swollen nerve root. The amount of pain relief you get from the injection depends on what is causing the nerve to be swollen and irritated, and how long your pain lasts. You are more likely to benefit from this injection if your pain is strong and comes on suddenly rather than if you have had pain for a long time. Tell a health care provider about:  Any allergies you have.  All medicines you are taking, including vitamins, herbs, eye drops, creams, and over-the-counter medicines.  Any problems you or family members have had with anesthetic medicines.  Any blood disorders you have.  Any surgeries you have had.  Any medical conditions you have.  Whether you are pregnant or may be pregnant. What are the risks? Generally, this is a safe procedure. However, problems may occur, including:  Headache.  Bleeding.  Infection.  Allergic reaction to medicines.  Damage to your nerves.  What happens before the procedure? Staying hydrated Follow instructions from your health care provider about hydration, which may include:  Up to 2 hours before the procedure - you may continue to drink clear liquids, such as water, clear fruit juice, black coffee, and plain tea.  Eating and drinking restrictions Follow instructions from your health care provider about eating and drinking, which may include:  8 hours before the procedure - stop eating heavy meals or foods such as meat, fried foods, or fatty foods.  6 hours before the procedure - stop eating light meals or foods, such as toast or cereal.  6 hours before the procedure - stop drinking milk or drinks that contain milk.  2 hours before the procedure - stop drinking clear liquids.  Medicine  You may be given medicines to lower anxiety.  Ask your health care provider about: ? Changing or stopping your regular medicines. This is  especially important if you are taking diabetes medicines or blood thinners. ? Taking medicines such as aspirin and ibuprofen. These medicines can thin your blood. Do not take these medicines before your procedure if your health care provider instructs you not to. General instructions  Plan to have someone take you home from the hospital or clinic. What happens during the procedure?  You may receive a medicine to help you relax (sedative).  You will be asked to lie on your abdomen.  The injection site will be cleaned.  A numbing medicine (local anesthetic) will be used to numb the injection site.  A needle will be inserted through your skin into the epidural space. You may feel some discomfort when this happens. An X-ray machine will be used to make sure the needle is put as close as possible to the affected nerve.  A steroid medicine and a local anesthetic will be injected into the epidural space.  The needle will be removed.  A bandage (dressing) will be put over the injection site. What happens after the procedure?  Your blood pressure, heart rate, breathing rate, and blood oxygen level will be monitored until the medicines you were given have worn off.  Your arm or leg may feel weak or numb for a few hours.  The injection site may feel sore.  Do not drive for 24 hours if you received a sedative. This information is not intended to replace advice given to you by your health care provider. Make sure you discuss any questions you have with your health care provider. Document Released: 10/25/2007 Document Revised: 12/30/2015 Document Reviewed: 11/03/2015 [employer] Interactive Patient Education  2017 [employer].

## 2017-02-13 NOTE — Patient Instructions (Addendum)
____________________________________________________________________________________________  Post-Procedure instructions Instructions:  Apply ice: Fill a plastic sandwich bag with crushed ice. Cover it with a small towel and apply to injection site. Apply for 15 minutes then remove x 15 minutes. Repeat sequence on day of procedure, until you go to bed. The purpose is to minimize swelling and discomfort after procedure.  Apply heat: Apply heat to procedure site starting the day following the procedure. The purpose is to treat any soreness and discomfort from the procedure.  Food intake: Start with clear liquids (like water) and advance to regular food, as tolerated.   Physical activities: Keep activities to a minimum for the first 8 hours after the procedure.   Driving: If you have received any sedation, you are not allowed to drive for 24 hours after your procedure.  Blood thinner: Restart your blood thinner 6 hours after your procedure. (Only for those taking blood thinners)  Insulin: As soon as you can eat, you may resume your normal dosing schedule. (Only for those taking insulin)  Infection prevention: Keep procedure site clean and dry.  Post-procedure Pain Diary: Extremely important that this be done correctly and accurately. Recorded information will be used to determine the next step in treatment.  Pain evaluated is that of treated area only. Do not include pain from an untreated area.  Complete every hour, on the hour, for the initial 8 hours. Set an alarm to help you do this part accurately.  Do not go to sleep and have it completed later. It will not be accurate.  Follow-up appointment: Keep your follow-up appointment after the procedure. Usually 2 weeks for most procedures. (6 weeks in the case of radiofrequency.) Bring you pain diary.  Expect:  From numbing medicine (AKA: Local Anesthetics): Numbness or decrease in pain.  Onset: Full effect within 15 minutes of  injected.  Duration: It will depend on the type of local anesthetic used. On the average, 1 to 8 hours.   From steroids: Decrease in swelling or inflammation. Once inflammation is improved, relief of the pain will follow.  Onset of benefits: Depends on the amount of swelling present. The more swelling, the longer it will take for the benefits to be seen. In some cases, up to 10 days.  Duration: Steroids will stay in the system x 2 weeks. Duration of benefits will depend on multiple posibilities including persistent irritating factors.  From procedure: Some discomfort is to be expected once the numbing medicine wears off. This should be minimal if ice and heat are applied as instructed. Call if:  You experience numbness and weakness that gets worse with time, as opposed to wearing off.  New onset bowel or bladder incontinence. (Spinal procedures only)  Emergency Numbers:  Durning business hours (Monday - Thursday, 8:00 AM - 4:00 PM) (Friday, 9:00 AM - 12:00 Noon): [phone]  After hours: [phone] ____________________________________________________________________________________________  Pain Management Discharge Instructions  General Discharge Instructions :  If you need to reach your doctor call: Monday-Friday 8:00 am - 4:00 pm at [phone] or toll free [phone].  After clinic hours [phone] to have operator reach doctor.  Bring all of your medication bottles to all your appointments in the pain clinic.  To cancel or reschedule your appointment with Pain Management please remember to call 24 hours in advance to avoid a fee.  Refer to the educational materials which you have been given on: General Risks, I had my Procedure. Discharge Instructions, Post Sedation.  Post Procedure Instructions:  The drugs you   were given will stay in your system until tomorrow, so for the next 24 hours you should not drive, make any legal decisions or drink any alcoholic  beverages.  You may eat anything you prefer, but it is better to start with liquids then soups and crackers, and gradually work up to solid foods.  Please notify your doctor immediately if you have any unusual bleeding, trouble breathing or pain that is not related to your normal pain.  Depending on the type of procedure that was done, some parts of your body may feel week and/or numb.  This usually clears up by tonight or the next day.  Walk with the use of an assistive device or accompanied by an adult for the 24 hours.  You may use ice on the affected area for the first 24 hours.  Put ice in a Ziploc bag and cover with a towel and place against area 15 minutes on 15 minutes off.  You may switch to heat after 24 hours.GENERAL RISKS AND COMPLICATIONS  What are the risk, side effects and possible complications? Generally speaking, most procedures are safe.  However, with any procedure there are risks, side effects, and the possibility of complications.  The risks and complications are dependent upon the sites that are lesioned, or the type of nerve block to be performed.  The closer the procedure is to the spine, the more serious the risks are.  Great care is taken when placing the radio frequency needles, block needles or lesioning probes, but sometimes complications can occur. 1. Infection: Any time there is an injection through the skin, there is a risk of infection.  This is why sterile conditions are used for these blocks.  There are four possible types of infection. 1. Localized skin infection. 2. Central Nervous System Infection-This can be in the form of Meningitis, which can be deadly. 3. Epidural Infections-This can be in the form of an epidural abscess, which can cause pressure inside of the spine, causing compression of the spinal cord with subsequent paralysis. This would require an emergency surgery to decompress, and there are no guarantees that the patient would recover from the  paralysis. 4. Discitis-This is an infection of the intervertebral discs.  It occurs in about 1% of discography procedures.  It is difficult to treat and it may lead to surgery.        2. Pain: the needles have to go through skin and soft tissues, will cause soreness.       3. Damage to internal structures:  The nerves to be lesioned may be near blood vessels or    other nerves which can be potentially damaged.       4. Bleeding: Bleeding is more common if the patient is taking blood thinners such as  aspirin, Coumadin, Ticiid, Plavix, etc., or if he/she have some genetic predisposition  such as hemophilia. Bleeding into the spinal canal can cause compression of the spinal  cord with subsequent paralysis.  This would require an emergency surgery to  decompress and there are no guarantees that the patient would recover from the  paralysis.       5. Pneumothorax:  Puncturing of a lung is a possibility, every time a needle is introduced in  the area of the chest or upper back.  Pneumothorax refers to free air around the  collapsed lung(s), inside of the thoracic cavity (chest cavity).  Another two possible  complications related to a similar event would include: Hemothorax and   Chylothorax.   These are variations of the Pneumothorax, where instead of air around the collapsed  lung(s), you may have blood or chyle, respectively.       6. Spinal headaches: They may occur with any procedures in the area of the spine.       7. Persistent CSF (Cerebro-Spinal Fluid) leakage: This is a rare problem, but may occur  with prolonged intrathecal or epidural catheters either due to the formation of a fistulous  track or a dural tear.       8. Nerve damage: By working so close to the spinal cord, there is always a possibility of  nerve damage, which could be as serious as a permanent spinal cord injury with  paralysis.       9. Death:  Although rare, severe deadly allergic reactions known as "Anaphylactic  reaction" can  occur to any of the medications used.      10. Worsening of the symptoms:  We can always make thing worse.  What are the chances of something like this happening? Chances of any of this occuring are extremely low.  By statistics, you have more of a chance of getting killed in a motor vehicle accident: while driving to the hospital than any of the above occurring .  Nevertheless, you should be aware that they are possibilities.  In general, it is similar to taking a shower.  Everybody knows that you can slip, hit your head and get killed.  Does that mean that you should not shower again?  Nevertheless always keep in mind that statistics do not mean anything if you happen to be on the wrong side of them.  Even if a procedure has a 1 (one) in a 1,000,000 (million) chance of going wrong, it you happen to be that one..Also, keep in mind that by statistics, you have more of a chance of having something go wrong when taking medications.  Who should not have this procedure? If you are on a blood thinning medication (e.g. Coumadin, Plavix, see list of "Blood Thinners"), or if you have an active infection going on, you should not have the procedure.  If you are taking any blood thinners, please inform your physician.  How should I prepare for this procedure?  Do not eat or drink anything at least six hours prior to the procedure.  Bring a driver with you .  It cannot be a taxi.  Come accompanied by an adult that can drive you back, and that is strong enough to help you if your legs get weak or numb from the local anesthetic.  Take all of your medicines the morning of the procedure with just enough water to swallow them.  If you have diabetes, make sure that you are scheduled to have your procedure done first thing in the morning, whenever possible.  If you have diabetes, take only half of your insulin dose and notify our nurse that you have done so as soon as you arrive at the clinic.  If you are  diabetic, but only take blood sugar pills (oral hypoglycemic), then do not take them on the morning of your procedure.  You may take them after you have had the procedure.  Do not take aspirin or any aspirin-containing medications, at least eleven (11) days prior to the procedure.  They may prolong bleeding.  Wear loose fitting clothing that may be easy to take off and that you would not mind if it got stained with Betadine   or blood.  Do not wear any jewelry or perfume  Remove any nail coloring.  It will interfere with some of our monitoring equipment.  NOTE: Remember that this is not meant to be interpreted as a complete list of all possible complications.  Unforeseen problems may occur.  BLOOD THINNERS The following drugs contain aspirin or other products, which can cause increased bleeding during surgery and should not be taken for 2 weeks prior to and 1 week after surgery.  If you should need take something for relief of minor pain, you may take acetaminophen which is found in Tylenol,m Datril, Anacin-3 and Panadol. It is not blood thinner. The products listed below are.  Do not take any of the products listed below in addition to any listed on your instruction sheet.  A.P.C or A.P.C with Codeine Codeine Phosphate Capsules #3 Ibuprofen Ridaura  ABC compound Congesprin Imuran rimadil  Advil Cope Indocin Robaxisal  Alka-Seltzer Effervescent Pain Reliever and Antacid Coricidin or Coricidin-D  Indomethacin Rufen  Alka-Seltzer plus Cold Medicine Cosprin Ketoprofen S-A-C Tablets  Anacin Analgesic Tablets or Capsules Coumadin Korlgesic Salflex  Anacin Extra Strength Analgesic tablets or capsules CP-2 Tablets Lanoril Salicylate  Anaprox Cuprimine Capsules Levenox Salocol  Anexsia-D Dalteparin Magan Salsalate  Anodynos Darvon compound Magnesium Salicylate Sine-off  Ansaid Dasin Capsules Magsal Sodium Salicylate  Anturane Depen Capsules Marnal Soma  APF Arthritis pain formula Dewitt's Pills  Measurin Stanback  Argesic Dia-Gesic Meclofenamic Sulfinpyrazone  Arthritis Bayer Timed Release Aspirin Diclofenac Meclomen Sulindac  Arthritis pain formula Anacin Dicumarol Medipren Supac  Analgesic (Safety coated) Arthralgen Diffunasal Mefanamic Suprofen  Arthritis Strength Bufferin Dihydrocodeine Mepro Compound Suprol  Arthropan liquid Dopirydamole Methcarbomol with Aspirin Synalgos  ASA tablets/Enseals Disalcid Micrainin Tagament  Ascriptin Doan's Midol Talwin  Ascriptin A/D Dolene Mobidin Tanderil  Ascriptin Extra Strength Dolobid Moblgesic Ticlid  Ascriptin with Codeine Doloprin or Doloprin with Codeine Momentum Tolectin  Asperbuf Duoprin Mono-gesic Trendar  Aspergum Duradyne Motrin or Motrin IB Triminicin  Aspirin plain, buffered or enteric coated Durasal Myochrisine Trigesic  Aspirin Suppositories Easprin Nalfon Trillsate  Aspirin with Codeine Ecotrin Regular or Extra Strength Naprosyn Uracel  Atromid-S Efficin Naproxen Ursinus  Auranofin Capsules Elmiron Neocylate Vanquish  Axotal Emagrin Norgesic Verin  Azathioprine Empirin or Empirin with Codeine Normiflo Vitamin E  Azolid Emprazil Nuprin Voltaren  Bayer Aspirin plain, buffered or children's or timed BC Tablets or powders Encaprin Orgaran Warfarin Sodium  Buff-a-Comp Enoxaparin Orudis Zorpin  Buff-a-Comp with Codeine Equegesic Os-Cal-Gesic   Buffaprin Excedrin plain, buffered or Extra Strength Oxalid   Bufferin Arthritis Strength Feldene Oxphenbutazone   Bufferin plain or Extra Strength Feldene Capsules Oxycodone with Aspirin   Bufferin with Codeine Fenoprofen Fenoprofen Pabalate or Pabalate-SF   Buffets II Flogesic Panagesic   Buffinol plain or Extra Strength Florinal or Florinal with Codeine Panwarfarin   Buf-Tabs Flurbiprofen Penicillamine   Butalbital Compound Four-way cold tablets Penicillin   Butazolidin Fragmin Pepto-Bismol   Carbenicillin Geminisyn Percodan   Carna Arthritis Reliever Geopen Persantine    Carprofen Gold's salt Persistin   Chloramphenicol Goody's Phenylbutazone   Chloromycetin Haltrain Piroxlcam   Clmetidine heparin Plaquenil   Cllnoril Hyco-pap Ponstel   Clofibrate Hydroxy chloroquine Propoxyphen         Before stopping any of these medications, be sure to consult the physician who ordered them.  Some, such as Coumadin (Warfarin) are ordered to prevent or treat serious conditions such as "deep thrombosis", "pumonary embolisms", and other heart problems.  The amount of time that you may need   off of the medication may also vary with the medication and the reason for which you were taking it.  If you are taking any of these medications, please make sure you notify your pain physician before you undergo any procedures.          Epidural Steroid Injection An epidural steroid injection is a shot of steroid medicine and numbing medicine that is given into the space between the spinal cord and the bones in your back (epidural space). The shot helps relieve pain caused by an irritated or swollen nerve root. The amount of pain relief you get from the injection depends on what is causing the nerve to be swollen and irritated, and how long your pain lasts. You are more likely to benefit from this injection if your pain is strong and comes on suddenly rather than if you have had pain for a long time. Tell a health care provider about:  Any allergies you have.  All medicines you are taking, including vitamins, herbs, eye drops, creams, and over-the-counter medicines.  Any problems you or family members have had with anesthetic medicines.  Any blood disorders you have.  Any surgeries you have had.  Any medical conditions you have.  Whether you are pregnant or may be pregnant. What are the risks? Generally, this is a safe procedure. However, problems may occur, including:  Headache.  Bleeding.  Infection.  Allergic reaction to medicines.  Damage to your  nerves.  What happens before the procedure? Staying hydrated Follow instructions from your health care provider about hydration, which may include:  Up to 2 hours before the procedure - you may continue to drink clear liquids, such as water, clear fruit juice, black coffee, and plain tea.  Eating and drinking restrictions Follow instructions from your health care provider about eating and drinking, which may include:  8 hours before the procedure - stop eating heavy meals or foods such as meat, fried foods, or fatty foods.  6 hours before the procedure - stop eating light meals or foods, such as toast or cereal.  6 hours before the procedure - stop drinking milk or drinks that contain milk.  2 hours before the procedure - stop drinking clear liquids.  Medicine  You may be given medicines to lower anxiety.  Ask your health care provider about: ? Changing or stopping your regular medicines. This is especially important if you are taking diabetes medicines or blood thinners. ? Taking medicines such as aspirin and ibuprofen. These medicines can thin your blood. Do not take these medicines before your procedure if your health care provider instructs you not to. General instructions  Plan to have someone take you home from the hospital or clinic. What happens during the procedure?  You may receive a medicine to help you relax (sedative).  You will be asked to lie on your abdomen.  The injection site will be cleaned.  A numbing medicine (local anesthetic) will be used to numb the injection site.  A needle will be inserted through your skin into the epidural space. You may feel some discomfort when this happens. An X-ray machine will be used to make sure the needle is put as close as possible to the affected nerve.  A steroid medicine and a local anesthetic will be injected into the epidural space.  The needle will be removed.  A bandage (dressing) will be put over the injection  site. What happens after the procedure?  Your blood pressure, heart rate, breathing   rate, and blood oxygen level will be monitored until the medicines you were given have worn off.  Your arm or leg may feel weak or numb for a few hours.  The injection site may feel sore.  Do not drive for 24 hours if you received a sedative. This information is not intended to replace advice given to you by your health care provider. Make sure you discuss any questions you have with your health care provider. Document Released: 10/25/2007 Document Revised: 12/30/2015 Document Reviewed: 11/03/2015 [employer] Interactive Patient Education  2017 [employer]  

## 2017-02-13 NOTE — Progress Notes (Signed)
Safety precautions to be maintained throughout the outpatient stay will include: orient to surroundings, keep bed in low position, maintain call bell within reach at all times, provide assistance with transfer out of bed and ambulation.  

## 2017-02-14 ENCOUNTER — Encounter: Payer: Self-pay | Admitting: Neurology

## 2017-02-14 ENCOUNTER — Ambulatory Visit (INDEPENDENT_AMBULATORY_CARE_PROVIDER_SITE_OTHER): Payer: Medicare Other | Admitting: Neurology

## 2017-02-14 ENCOUNTER — Telehealth: Payer: Self-pay

## 2017-02-14 VITALS — BP 126/78 | HR 98 | Ht 69.0 in | Wt 209.0 lb

## 2017-02-14 DIAGNOSIS — R413 Other amnesia: Secondary | ICD-10-CM | POA: Diagnosis not present

## 2017-02-14 DIAGNOSIS — G40009 Localization-related (focal) (partial) idiopathic epilepsy and epileptic syndromes with seizures of localized onset, not intractable, without status epilepticus: Secondary | ICD-10-CM

## 2017-02-14 MED ORDER — LEVETIRACETAM 500 MG PO TABS: 500.0000 mg | ORAL_TABLET | Freq: Two times a day (BID) | ORAL | 0 refills | Status: DC

## 2017-02-14 MED ORDER — LAMOTRIGINE 100 MG PO TABS: 100.0000 mg | ORAL_TABLET | Freq: Two times a day (BID) | ORAL | 3 refills | Status: DC

## 2017-02-14 NOTE — Patient Instructions (Signed)
1. Increase Lamotrigine to 100mg  twice a day: With your current bottle of Lamotrigine 25mg , take 4 tablets twice a day. Once done, your new bottle will be for Lamotrigine 100mg , take 1 tablet twice a day 2. After a week of the higher dose of Lamotrigine, start reducing the Keppra to 1 tablet daily for 1 week, then stop 3. Discuss cough control with your PCP or lung doctor 4. Schedule Neurocognitive testing with Dr. Otelia Limes have been referred for a neurocognitive evaluation in our office.   The evaluation consists of three appointments.   1. The first appointment is about 45 minutes and is a clinical interview with the neuropsychologist (Dr. Macarthur Critchley). You can bring someone with you to this appointment, as it is helpful for Dr. Si Raider to hear from both you and another adult who knows you well.   2. The second appointment is 2-3 hours long and is with the psychometrician Milana Kidney). You will complete a variety of tasks- mostly question-and-answer, some paper-and-pencil, some on the computer. There is nothing you need to do to prepare for this appointment, but having a good night's sleep prior to the testing, and bringing eyeglasses and hearing aids (if you wear them), is advised.   3. The final appointment is a follow-up with Dr. Si Raider where she will go over the test results with you and provide recommendations. This appointment is about 30 minutes.  If you would like a family member to receive this information as well, please bring them to the appointment.   We have to reserve several hours of the neuropsychologist's time and the psychometrician's time for your appointment. As such, please note that there is a No-Show fee of $100. If you are unable to attend any of your appointments, please contact our office as soon as possible to reschedule.

## 2017-02-14 NOTE — Telephone Encounter (Signed)
Post procedure phone call.   No answer.  

## 2017-02-14 NOTE — Progress Notes (Signed)
NEUROLOGY FOLLOW UP OFFICE NOTE  [name] [id number]  DOB: 10/06/1960  HISTORY OF PRESENT ILLNESS: I had the pleasure of seeing Patrick Gregory in follow-up in the neurology clinic on 02/14/2017.  He is again accompanied by his wife who helps supplement the history today. He has a Neurosurgeon in training. The patient was last seen 2 months ago for recurrent episodes concerning for seizures with episodes of unresponsiveness with falls. They continued to report episodes on Keppra 500mg  BID and were wondering about psychogenic seizures. He had a 48-hour EEG done which showed occasional focal slowing over the left temporal region, no epileptiform discharges seen, typical events not captured. He reports having a seizure every 1-2 weeks, Lamotrigine was started last May. He is currently on Lamotrigine 50mg  BID and Keppra 500mg  BID with no side effects. His wife reports a couple of times she has come home to find a goose egg on his forehead, more when he first started the Lamotrigine. They were also reporting passing out with coughing spells, most recently this morning where he felt like he was going to pass out. He has bad GERD as well. She denies any staring/unresponsive episodes. His wife continues to report memory loss, he forgot how to write his name when they were doing taxes. He forgets where he puts things 2 minutes later. He forgets conversations. He does not drive. She is in charge of finances and his medications.   HPI 03/03/2016: This is a pleasant 56 yo RH man with a history of COPD, chronic neck and back pain, PTSD, who presented for seizures. He reports that the first seizure occurred 3-4 years ago, he woke up on the floor then 2-3 hours later passed out again. He denied any prior warning symptoms. He was started on an unrecalled seizure medication which he took for a year then self-discontinued because he had been seizure-free.  He was event-free until January 2017 when he had 3 episodes in a week.  The first episode he was sitting on the couch when his head went back and he became unresponsive for a few seconds, eyes were closed. The next day, he was working on something on the wall when his uncle noticed he looked lost. He called to him and he snapped back. The third episode occurred 2 days later, again while sitting on the couch, his head went back with eyes closed, then he had mild jerking of both arms for a few seconds. No tongue bite or incontinence. When he came to, he told his fiancee he had a headache and felt nauseated. He had another mild episode 3 weeks ago. His is amnestic of these episodes, his whole body feels weak after, no focal weakness. He and his fiancee deny any staring/unresponsive episodes, no gaps in time. He denies any  olfactory/gustatory hallucinations, deja vu, rising epigastric sensation, myoclonic jerks. When he turns his head to the right, he has numbness over the left arm. He reports having degenerative disc disease and sees neurosurgery. He was started on Keppra 500mg  and denies any side effects since taking it the past 3 days. He was in the ER last 02/25/16 for gait difficulty, he was staggering, walking like was drunk. He was evaluated by Neurology at [facility], no focal weakness seen, finger to nose testing appeared functional. Symptoms lasted 2-3 days. He had not started Keppra when these symptoms began. He had an MRI brain with and without contrast which I personally reviewed, there were no acute changes, hippocampi symmetric with  no abnormal signal or enhancement seen. He had a head CT the next day with no acute changes. The gait instability has since resolved.  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 He has had several head injuries with loss of consciousness while he was in [employer], otherwise he had a normal birth and early development.  There is no  history of febrile convulsions, CNS infections such as meningitis/encephalitis, neurosurgical procedures, or family history of seizures.  PAST MEDICAL HISTORY: Past Medical History:  Diagnosis Date  . Allergic rhinitis   . Arthritis    shoulders, back  . Asthma   . Bipolar disorder ([place])   . Chickenpox   . COPD (chronic obstructive pulmonary disease) ([place])   . Depression   . Low back pain   . Seizures ([place])    last one approx 18 mos ago.  No meds approx 1 yr.  . Sleep apnea    supposed to use CPAP. Does not have.  . Stiff neck    limited side to side turn.  Left side pain when looking up.  . Throat cancer [facility])     MEDICATIONS: Current Outpatient Prescriptions on File Prior to Visit  Medication Sig Dispense Refill  . albuterol (PROVENTIL HFA;VENTOLIN HFA) 108 (90 Base) MCG/ACT inhaler Inhale 2 puffs into the lungs every 6 (six) hours as needed for wheezing or shortness of breath. 3 Inhaler 2  . budesonide-formoterol (SYMBICORT) 80-4.5 MCG/ACT inhaler Inhale 2 puffs into the lungs 2 (two) times daily. 3 Inhaler 3  . Calcium Carb-Cholecalciferol (CALCIUM PLUS D3 ABSORBABLE) 3317100651 MG-UNIT CAPS Take 1 capsule by mouth daily with breakfast. 90 capsule 0  . Cholecalciferol 5000 units capsule Take 1 capsule (5,000 Units total) by mouth daily. 90 capsule 0  . clonazePAM (KLONOPIN) 1 MG tablet Take 1 mg by mouth 3 (three) times daily as needed.     . cyclobenzaprine (FLEXERIL) 10 MG tablet Take 1 tablet (10 mg total) by mouth 3 (three) times daily. 90 tablet 0  . fluticasone (FLONASE) 50 MCG/ACT nasal spray Place 2 sprays into both nostrils daily.    Marland Kitchen ipratropium-albuterol (DUONEB) 0.5-2.5 (3) MG/3ML SOLN Take 3 mLs by nebulization 2 (two) times daily.    Marland Kitchen lamoTRIgine (LAMICTAL) 25 MG tablet Take 1 tablet twice a day for 2 weeks, then increase to 2 tablets twice a day 120 tablet 6  . levETIRAcetam (KEPPRA) 500 MG tablet Take 1 tablet (500 mg total) by mouth 2 (two) times daily. 60  tablet 11  . Lurasidone HCl 60 MG TABS Take 80 mg by mouth at bedtime.     . Magnesium Oxide 500 MG CAPS Take 1 capsule (500 mg total) by mouth 2 (two) times daily at 8 am and 10 pm. 90 capsule 0  . pantoprazole (PROTONIX) 40 MG tablet Take 1 tablet (40 mg total) by mouth 2 (two) times daily before a meal. 60 tablet 3  . pregabalin (LYRICA) 75 MG capsule Take 1 capsule (75 mg total) by mouth 2 (two) times daily. 60 capsule 0  . ranitidine (ZANTAC) 300 MG tablet Take 300 mg by mouth at bedtime.    . sildenafil (VIAGRA) 100 MG tablet Take 100 mg by mouth daily as needed for erectile dysfunction.     No current facility-administered medications on file prior to visit.     ALLERGIES: No Known Allergies  FAMILY HISTORY: Family History  Problem Relation Age of Onset  . Alcoholism Unknown   . Arthritis Unknown   . Hyperlipidemia Unknown   . Heart disease Unknown   . Sudden death Unknown   . Kidney disease Unknown   . Mental illness Unknown   . Depression Mother   . Bipolar disorder Mother   . Depression Brother   . Bipolar disorder Brother   . Heart disease Brother   . Heart disease Brother     SOCIAL HISTORY: Social History   Social History  . Marital status: Single    Spouse name: N/A  . Number of children: N/A  . Years of education: N/A   Occupational History  . retired     Nature conservation officer   Social History Main Topics  . Smoking status: Former Smoker    Packs/day: 1.00    Years: 35.00    Types: Cigarettes    Quit date: 05/01/2014  . Smokeless tobacco: Never Used     Comment: smokes 1-2 cigarettes weekly.   . Alcohol use 0.0 oz/week     Comment: occ  . Drug use: No  . Sexual activity: Not on file   Other Topics Concern  . Not on file   Social History Narrative   Lives with fiancee. Lives in apartment on the 3rd floor. Sometimes feels weakness going up the stairs. Formerly lived in [place]. Has been here for 8 months.     REVIEW OF SYSTEMS: Constitutional: No  fevers, chills, or sweats, no generalized fatigue, change in appetite Eyes: No visual changes, double vision, eye pain Ear, nose and throat: No hearing loss, ear pain, nasal congestion, sore throat Cardiovascular: No chest pain, palpitations Respiratory:  No shortness of breath at rest or with exertion, wheezes GastrointestinaI: No nausea, vomiting, diarrhea, abdominal pain, fecal incontinence Genitourinary:  No dysuria, urinary retention or frequency Musculoskeletal:  + neck pain, back pain Integumentary: No rash, pruritus, skin lesions Neurological: as above Psychiatric: + depression, insomnia, anxiety Endocrine: No palpitations, fatigue, diaphoresis, mood swings, change in appetite, change in weight, increased thirst Hematologic/Lymphatic:  No anemia, purpura, petechiae. Allergic/Immunologic: no itchy/runny eyes, nasal congestion, recent allergic reactions, rashes  PHYSICAL EXAM: Vitals:   02/14/17 1532  BP: 126/78  Pulse: 98   General: No acute distress Head:  Normocephalic/atraumatic Neck: supple, no paraspinal tenderness, full range of motion Heart:  Regular rate and rhythm Lungs:  Clear to auscultation bilaterally Back: No paraspinal tenderness Skin/Extremities: No rash, no edema Neurological Exam: alert and oriented to person, place, and month/year, day of week. No aphasia or dysarthria. Fund of knowledge is appropriate.  Remote memory intact. 1/3 delayed recall.  Attention and concentration are normal.    Able to name objects and repeat phrases. Cranial nerves: Pupils equal, round, reactive to light. Extraocular movements intact with no nystagmus. Visual fields full. Facial sensation intact. No facial asymmetry. Tongue, uvula, palate midline.  Motor: Bulk and tone normal, muscle strength 5/5 throughout with no pronator drift.  Sensation to light touch intact.  No extinction to double simultaneous stimulation.  Deep tendon reflexes 2+ throughout, toes downgoing.  Finger to nose  testing intact.  Gait narrow-based and steady, able to tandem walk adequately.  Romberg negative.  IMPRESSION: This is a pleasant 56 yo RH man with a history of COPD, chronic neck and back pain, PTSD, with a 3-4 year history of seizures. His EEG showed occasional left temporal slowing, no epileptiform discharges. MRI brain unremarkable. He continued to report seizures with  falls every 1-2 weeks on Keppra, and was started on Lamotrigine to help with mood as well. His wife still comes home to him having head bumps, more when Lamotrigine was first started. Increase to 100mg  BID. He will start tapering off Keppra to streamline medications. They had previously expressed concern about PNES, if he continues to have seizures despite adequate doses of AEDs, he will be referred for video EEG monitoring to classify his seizures. He will discuss symptoms suggestive of cough syncope with his PCP and pulmonologist. They continue to report memory loss and will be scheduled for Neuropsychological evaluation.  Continue Psychiatry follow-up. [place] driving laws were again discussed with the patient, and he knows to stop driving after a seizure, until 6 months seizure-free. He will follow-up in 4 months and knows to call for any changes.   Thank you for allowing me to participate in his care.  Please do not hesitate to call for any questions or concerns.  The duration of this appointment visit was 25 minutes of face-to-face time with the patient.  Greater than 50% of this time was spent in counseling, explanation of diagnosis, planning of further management, and coordination of care.   Ellouise Newer, M.D.   CC: Dr. Caryl Bis

## 2017-02-17 ENCOUNTER — Encounter: Payer: Self-pay | Admitting: Neurology

## 2017-02-17 DIAGNOSIS — R413 Other amnesia: Secondary | ICD-10-CM | POA: Insufficient documentation

## 2017-03-06 ENCOUNTER — Encounter: Payer: Self-pay | Admitting: Pain Medicine

## 2017-03-06 ENCOUNTER — Ambulatory Visit: Payer: Medicare Other | Attending: Pain Medicine | Admitting: Pain Medicine

## 2017-03-06 ENCOUNTER — Ambulatory Visit: Payer: Medicare Other | Admitting: Pain Medicine

## 2017-03-06 VITALS — BP 141/88 | HR 78 | Temp 98.0°F | Resp 16 | Ht 69.0 in | Wt 202.0 lb

## 2017-03-06 DIAGNOSIS — F419 Anxiety disorder, unspecified: Secondary | ICD-10-CM | POA: Diagnosis not present

## 2017-03-06 DIAGNOSIS — G8929 Other chronic pain: Secondary | ICD-10-CM | POA: Diagnosis not present

## 2017-03-06 DIAGNOSIS — Z8249 Family history of ischemic heart disease and other diseases of the circulatory system: Secondary | ICD-10-CM | POA: Insufficient documentation

## 2017-03-06 DIAGNOSIS — E559 Vitamin D deficiency, unspecified: Secondary | ICD-10-CM | POA: Insufficient documentation

## 2017-03-06 DIAGNOSIS — Z859 Personal history of malignant neoplasm, unspecified: Secondary | ICD-10-CM | POA: Diagnosis not present

## 2017-03-06 DIAGNOSIS — G4730 Sleep apnea, unspecified: Secondary | ICD-10-CM | POA: Insufficient documentation

## 2017-03-06 DIAGNOSIS — Z818 Family history of other mental and behavioral disorders: Secondary | ICD-10-CM | POA: Insufficient documentation

## 2017-03-06 DIAGNOSIS — Z841 Family history of disorders of kidney and ureter: Secondary | ICD-10-CM | POA: Diagnosis not present

## 2017-03-06 DIAGNOSIS — Z8619 Personal history of other infectious and parasitic diseases: Secondary | ICD-10-CM | POA: Insufficient documentation

## 2017-03-06 DIAGNOSIS — Z79891 Long term (current) use of opiate analgesic: Secondary | ICD-10-CM | POA: Diagnosis not present

## 2017-03-06 DIAGNOSIS — R569 Unspecified convulsions: Secondary | ICD-10-CM | POA: Diagnosis not present

## 2017-03-06 DIAGNOSIS — M5412 Radiculopathy, cervical region: Secondary | ICD-10-CM | POA: Diagnosis not present

## 2017-03-06 DIAGNOSIS — R413 Other amnesia: Secondary | ICD-10-CM | POA: Insufficient documentation

## 2017-03-06 DIAGNOSIS — K219 Gastro-esophageal reflux disease without esophagitis: Secondary | ICD-10-CM | POA: Insufficient documentation

## 2017-03-06 DIAGNOSIS — Z8261 Family history of arthritis: Secondary | ICD-10-CM | POA: Diagnosis not present

## 2017-03-06 DIAGNOSIS — M9981 Other biomechanical lesions of cervical region: Secondary | ICD-10-CM | POA: Diagnosis not present

## 2017-03-06 DIAGNOSIS — M79601 Pain in right arm: Secondary | ICD-10-CM | POA: Insufficient documentation

## 2017-03-06 DIAGNOSIS — M549 Dorsalgia, unspecified: Secondary | ICD-10-CM | POA: Diagnosis not present

## 2017-03-06 DIAGNOSIS — F17210 Nicotine dependence, cigarettes, uncomplicated: Secondary | ICD-10-CM | POA: Insufficient documentation

## 2017-03-06 DIAGNOSIS — M542 Cervicalgia: Secondary | ICD-10-CM | POA: Diagnosis not present

## 2017-03-06 DIAGNOSIS — F319 Bipolar disorder, unspecified: Secondary | ICD-10-CM | POA: Insufficient documentation

## 2017-03-06 DIAGNOSIS — J441 Chronic obstructive pulmonary disease with (acute) exacerbation: Secondary | ICD-10-CM | POA: Insufficient documentation

## 2017-03-06 DIAGNOSIS — Z811 Family history of alcohol abuse and dependence: Secondary | ICD-10-CM | POA: Insufficient documentation

## 2017-03-06 DIAGNOSIS — G894 Chronic pain syndrome: Secondary | ICD-10-CM | POA: Diagnosis not present

## 2017-03-06 DIAGNOSIS — M4802 Spinal stenosis, cervical region: Secondary | ICD-10-CM

## 2017-03-06 NOTE — Progress Notes (Signed)
Patient's Name: Patrick Gregory  MRN: [id number]  Referring Provider: Leone Haven, MD  DOB: [birth year]  PCP: Leone Haven, MD  DOS: 03/06/2017  Note by: Gaspar Cola, MD  Service setting: Ambulatory outpatient  Specialty: Interventional Pain Management  Location: [facility] (AMB) Pain Management Facility    Patient type: Established   Primary Reason(s) for Visit: Encounter for post-procedure evaluation of chronic illness with mild to moderate exacerbation CC: Neck Pain  HPI  Patrick Gregory is a 56 y.o. year old, male patient, who comes today for a post-procedure evaluation. He has Hoarseness; GERD (gastroesophageal reflux disease); Chronic neck pain (Location of Primary Source of Pain) (Bilateral) (R>L); Anxiety; Muscle cramps; Epigastric discomfort; COPD (chronic obstructive pulmonary disease) ([place]); DOE (dyspnea on exertion); Cough; Seizures ([place]); Chronic low back pain (Location of Tertiary source of pain) (Bilateral) (R>L); Transient alteration of awareness; PTSD (post-traumatic stress disorder); Cigarette smoker; Viral URI; Bipolar disorder ([place]); Sleep apnea; Chronic pain syndrome; Long term current use of opiate analgesic; Long term prescription opiate use; Opiate use (20 MME/Day); Chronic upper back pain (Location of Secondary source of pain) (Bilateral) (L>R); Chronic shoulder pain (Bilateral) (R>L); Rotator cuff syndrome (Right); Chronic upper extremity pain (Right); Cervical foraminal stenosis (Right); Chronic cervical radicular pain (Right) (intermittent); Chronic knee pain (Right); Osteoarthritis of knee (Right); COPD exacerbation ([place]); Sinusitis; Elevated C-reactive protein (CRP); Vitamin D deficiency; Localization-related idiopathic epilepsy and epileptic syndromes with seizures of localized onset, not intractable, without status epilepticus ([place]); Musculoskeletal pain; Neuropathic pain; Marijuana use; and Memory loss on his problem list. His primarily concern today is the Neck Pain  Pain  Assessment: Location: Right, Left Neck Radiating: down the right arm to the fingertips Onset: More than a month ago Duration: Chronic pain Quality: Tingling, Constant, Sharp, Numbness, Other (Comment) (weakness in the hand) Severity: 5 /10 (self-reported pain score)  Note: Reported level is compatible with observation.                   Effect on ADL: weakness in hand, difficult to hold cup.  Timing: Constant Modifying factors: procedure has helped the left side,  medicine helps minimally.    Patrick Gregory comes in today for post-procedure evaluation after the treatment done on 02/13/2017. The patient returns to the clinics today after having had his third right-sided cervical epidural steroid injection under fluoroscopic guidance and IV sedation. Unfortunately, the pain on the right arm continues while the pain on the left has completely subsided and he has attained 100% relief of the pain. Today I have reviewed the patient's cervical MRI and it shows a right sided disc protrusion at the C6-7 level compressing the C7 nerve root.  Further details on both, my assessment(s), as well as the proposed treatment plan, please see below.  Post-Procedure Assessment  02/13/2017 Procedure: Therapeutic right-sided cervical epidural steroid injection #3 under fluoroscopic guidance and IV sedation Pre-procedure pain score:  5/10 Post-procedure pain score: 0/10 (100% relief) Influential Factors: BMI: 29.83 kg/m Intra-procedural challenges: None observed.         Assessment challenges: None detected.              Reported side-effects: None.        Post-procedural adverse reactions or complications: None reported         Sedation: Sedation provided. When no sedatives are used, the analgesic levels obtained are directly associated to the effectiveness of the local anesthetics. However, when sedation is provided, the level of analgesia obtained during the initial 1  hour following the intervention, is believed to  be the result of a combination of factors. These factors may include, but are not limited to: 1. The effectiveness of the local anesthetics used. 2. The effects of the analgesic(s) and/or anxiolytic(s) used. 3. The degree of discomfort experienced by the patient at the time of the procedure. 4. The patients ability and reliability in recalling and recording the events. 5. The presence and influence of possible secondary gains and/or psychosocial factors. Reported result: Relief experienced during the 1st hour after the procedure: 100 % (Ultra-Short Term Relief)            Interpretative annotation: Clinically appropriate result. Analgesia during this period is likely to be Local Anesthetic and/or IV Sedative (Analgesic/Anxiolytic) related.          Effects of local anesthetic: The analgesic effects attained during this period are directly associated to the localized infiltration of local anesthetics and therefore cary significant diagnostic value as to the etiological location, or anatomical origin, of the pain. Expected duration of relief is directly dependent on the pharmacodynamics of the local anesthetic used. Long-acting (4-6 hours) anesthetics used.  Reported result: Relief during the next 4 to 6 hour after the procedure: 0 % (Short-Term Relief)            Interpretative annotation: Clinically appropriate result. Analgesia during this period is likely to be Local Anesthetic-related.          Long-term benefit: Defined as the period of time past the expected duration of local anesthetics (1 hour for short-acting and 4-6 hours for long-acting). With the possible exception of prolonged sympathetic blockade from the local anesthetics, benefits during this period are typically attributed to, or associated with, other factors such as analgesic sensory neuropraxia, antiinflammatory effects, or beneficial biochemical changes provided by agents other than the local anesthetics.  Reported result: Extended  relief following procedure: 100 % (pain is gone from left arm.  ) (Long-Term Relief)            Interpretative annotation: Clinically appropriate result. Good relief. No permanent benefit expected. Inflammation plays a part in the etiology to the pain. The pain on the right arm appears to be secondary to mechanical compression of the C7 nerve root by the C6-7 disc protrusion.  Current benefits: Defined as persistent relief that continues at this point in time.   Reported results: Treated area: 100 % Pain relief on the left arm and 0% on the right arm. Interpretative annotation: Recurrence of symptoms. No permanent benefit expected. Effective therapeutic approach. Because the patient attained no pain relief on the right arm, we will send him to have a neurosurgical consultation for a possible C6-7 discectomy.  Interpretation: Results would suggest a successful diagnostic and therapeutic intervention. However, because the patient attained no relief on the right arm, he will need a neurosurgical consult for possible discectomy.          Plan:  Neurosurgical consult for C6-7 discectomy on the right side.  Laboratory Chemistry  Inflammation Markers (CRP: Acute Phase) (ESR: Chronic Phase) Lab Results  Component Value Date   CRP 2.6 (H) 08/30/2016   ESRSEDRATE 20 08/30/2016                 Renal Function Markers Lab Results  Component Value Date   BUN 8 08/30/2016   CREATININE 1.05 08/30/2016   GFRAA >60 08/30/2016   GFRNONAA >60 08/30/2016  Hepatic Function Markers Lab Results  Component Value Date   AST 22 08/30/2016   ALT 12 (L) 08/30/2016   ALBUMIN 4.0 08/30/2016   ALKPHOS 72 08/30/2016                 Electrolytes Lab Results  Component Value Date   NA 138 08/30/2016   K 4.2 08/30/2016   CL 104 08/30/2016   CALCIUM 8.8 (L) 08/30/2016   MG 2.1 08/30/2016                 Neuropathy Markers Lab Results  Component Value Date   VITAMINB12 383 06/13/2016                  Bone Pathology Markers Lab Results  Component Value Date   ALKPHOS 72 08/30/2016   25OHVITD1 11 (L) 08/30/2016   25OHVITD2 <1.0 08/30/2016   25OHVITD3 11 08/30/2016   CALCIUM 8.8 (L) 08/30/2016                 Coagulation Parameters Lab Results  Component Value Date   PLT 293 02/25/2016                 Cardiovascular Markers Lab Results  Component Value Date   HGB 16.8 02/25/2016   HCT 47.8 02/25/2016                 Note: Lab results reviewed.  Recent Diagnostic Imaging Review  Dg C-arm 1-60 Min-no Report Result Date: 02/13/2017 Fluoroscopy was utilized by the requesting physician.  No radiographic interpretation.   Note: Imaging results reviewed.          Meds   Current Meds  Medication Sig  . albuterol (PROVENTIL HFA;VENTOLIN HFA) 108 (90 Base) MCG/ACT inhaler Inhale 2 puffs into the lungs every 6 (six) hours as needed for wheezing or shortness of breath.  . budesonide-formoterol (SYMBICORT) 80-4.5 MCG/ACT inhaler Inhale 2 puffs into the lungs 2 (two) times daily.  . Calcium Carb-Cholecalciferol (CALCIUM PLUS D3 ABSORBABLE) [id number] MG-UNIT CAPS Take 1 capsule by mouth daily with breakfast.  . cholecalciferol (VITAMIN D) 400 units TABS tablet Take 1,200 Units by mouth.  . clonazePAM (KLONOPIN) 1 MG tablet Take 1 mg by mouth 3 (three) times daily as needed.   . fluticasone (FLONASE) 50 MCG/ACT nasal spray Place 2 sprays into both nostrils daily.  Marland Kitchen ipratropium-albuterol (DUONEB) 0.5-2.5 (3) MG/3ML SOLN Take 3 mLs by nebulization 2 (two) times daily.  Marland Kitchen lamoTRIgine (LAMICTAL) 100 MG tablet Take 1 tablet (100 mg total) by mouth 2 (two) times daily.  . Lurasidone HCl 60 MG TABS Take 80 mg by mouth at bedtime.   . Magnesium Oxide 500 MG CAPS Take 1 capsule (500 mg total) by mouth 2 (two) times daily at 8 am and 10 pm.  . pantoprazole (PROTONIX) 40 MG tablet Take 1 tablet (40 mg total) by mouth 2 (two) times daily before a meal.  . ranitidine (ZANTAC) 300 MG  tablet Take 300 mg by mouth at bedtime.  . sildenafil (VIAGRA) 100 MG tablet Take 100 mg by mouth daily as needed for erectile dysfunction.    ROS  Constitutional: Denies any fever or chills Gastrointestinal: No reported hemesis, hematochezia, vomiting, or acute GI distress Musculoskeletal: Denies any acute onset joint swelling, redness, loss of ROM, or weakness Neurological: No reported episodes of acute onset apraxia, aphasia, dysarthria, agnosia, amnesia, paralysis, loss of coordination, or loss of consciousness  Allergies  Mr. Boschert has No Known Allergies.  [place]  Drug: Mr. Pandit  reports that he does not use drugs. Alcohol:  reports that he drinks alcohol. Tobacco:  reports that he quit smoking about 2 years ago. His smoking use included Cigarettes. He has a 35.00 pack-year smoking history. He has never used smokeless tobacco. Medical:  has a past medical history of Allergic rhinitis; Arthritis; Asthma; Bipolar disorder ([place]); Chickenpox; COPD (chronic obstructive pulmonary disease) ([place]); Depression; Low back pain; Seizures ([place]); Sleep apnea; Stiff neck; and Throat cancer ([place]). Surgical: Mr. Switzer  has a past surgical history that includes Throat surgery; Direct laryngoscopy (Right, 10/23/2015); and Rotator cuff repair (Bilateral, 2005, 2013). Family: family history includes Alcoholism in his unknown relative; Arthritis in his unknown relative; Bipolar disorder in his brother and mother; Depression in his brother and mother; Heart disease in his brother, brother, and unknown relative; Hyperlipidemia in his unknown relative; Kidney disease in his unknown relative; Mental illness in his unknown relative; Sudden death in his unknown relative.  Constitutional Exam  General appearance: Well nourished, well developed, and well hydrated. In no apparent acute distress Vitals:   03/06/17 0903  BP: (!) 141/88  Pulse: 78  Resp: 16  Temp: 98 F (36.7 C)  TempSrc: Oral  SpO2: 100%  Weight: 202  lb (91.6 kg)  Height: _0  (1.753 m)   BMI Assessment: Estimated body mass index is 29.83 kg/m as calculated from the following:   Height as of this encounter: _1  (1.753 m).   Weight as of this encounter: 202 lb (91.6 kg).  BMI interpretation table: BMI level Category Range association with higher incidence of chronic pain  <18 kg/m2 Underweight   18.5-24.9 kg/m2 Ideal body weight   25-29.9 kg/m2 Overweight Increased incidence by 20%  30-34.9 kg/m2 Obese (Class I) Increased incidence by 68%  35-39.9 kg/m2 Severe obesity (Class II) Increased incidence by 136%  >40 kg/m2 Extreme obesity (Class III) Increased incidence by 254%   BMI Readings from Last 4 Encounters:  03/06/17 29.83 kg/m  02/14/17 30.86 kg/m  02/13/17 29.24 kg/m  01/30/17 28.06 kg/m   Wt Readings from Last 4 Encounters:  03/06/17 202 lb (91.6 kg)  02/14/17 209 lb (94.8 kg)  02/13/17 198 lb (89.8 kg)  01/30/17 190 lb (86.2 kg)  Psych/Mental status: Alert, oriented x 3 (person, place, & time)       Eyes: PERLA Respiratory: No evidence of acute respiratory distress  Cervical Spine Area Exam  Skin & Axial Inspection: No masses, redness, edema, swelling, or associated skin lesions Alignment: Symmetrical Functional ROM: Unrestricted ROM      Stability: No instability detected Muscle Tone/Strength: Functionally intact. No obvious neuro-muscular anomalies detected. Sensory (Neurological): Unimpaired Palpation: No palpable anomalies              Upper Extremity (UE) Exam    Side: Right upper extremity  Side: Left upper extremity  Skin & Extremity Inspection: Skin color, temperature, and hair growth are WNL. No peripheral edema or cyanosis. No masses, redness, swelling, asymmetry, or associated skin lesions. No contractures.  Skin & Extremity Inspection: Skin color, temperature, and hair growth are WNL. No peripheral edema or cyanosis. No masses, redness, swelling, asymmetry, or associated skin lesions. No  contractures.  Functional ROM: Guarding          Functional ROM: Unrestricted ROM          Muscle Tone/Strength: Guarding  Muscle Tone/Strength: Functionally intact. No obvious neuro-muscular anomalies detected.  Sensory (Neurological): Dermatomal pain pattern  Sensory (Neurological): Unimpaired  Palpation: No palpable anomalies              Palpation: No palpable anomalies              Specialized Test(s): Deferred         Specialized Test(s): Deferred          Thoracic Spine Area Exam  Skin & Axial Inspection: No masses, redness, or swelling Alignment: Symmetrical Functional ROM: Unrestricted ROM Stability: No instability detected Muscle Tone/Strength: Functionally intact. No obvious neuro-muscular anomalies detected. Sensory (Neurological): Unimpaired Muscle strength & Tone: No palpable anomalies  Lumbar Spine Area Exam  Skin & Axial Inspection: No masses, redness, or swelling Alignment: Symmetrical Functional ROM: Unrestricted ROM      Stability: No instability detected Muscle Tone/Strength: Functionally intact. No obvious neuro-muscular anomalies detected. Sensory (Neurological): Unimpaired Palpation: No palpable anomalies       Provocative Tests: Lumbar Hyperextension and rotation test: evaluation deferred today       Lumbar Lateral bending test: evaluation deferred today       Patrick's Maneuver: evaluation deferred today                    Gait & Posture Assessment  Ambulation: Unassisted Gait: Relatively normal for age and body habitus Posture: WNL   Lower Extremity Exam    Side: Right lower extremity  Side: Left lower extremity  Skin & Extremity Inspection: Skin color, temperature, and hair growth are WNL. No peripheral edema or cyanosis. No masses, redness, swelling, asymmetry, or associated skin lesions. No contractures.  Skin & Extremity Inspection: Skin color, temperature, and hair growth are WNL. No peripheral edema or cyanosis. No masses, redness, swelling,  asymmetry, or associated skin lesions. No contractures.  Functional ROM: Unrestricted ROM          Functional ROM: Unrestricted ROM          Muscle Tone/Strength: Functionally intact. No obvious neuro-muscular anomalies detected.  Muscle Tone/Strength: Functionally intact. No obvious neuro-muscular anomalies detected.  Sensory (Neurological): Unimpaired  Sensory (Neurological): Unimpaired  Palpation: No palpable anomalies  Palpation: No palpable anomalies   Assessment  Primary Diagnosis & Pertinent Problem List: The primary encounter diagnosis was Chronic neck pain (Location of Primary Source of Pain) (Bilateral) (R>L). Diagnoses of Chronic upper back pain (Location of Secondary source of pain) (Bilateral) (L>R), Cervical foraminal stenosis (Right), Chronic cervical radicular pain (Right) (intermittent), and Chronic upper extremity pain (Right) were also pertinent to this visit.  Status Diagnosis  Controlled Controlled Persistent 1. Chronic neck pain (Location of Primary Source of Pain) (Bilateral) (R>L)   2. Chronic upper back pain (Location of Secondary source of pain) (Bilateral) (L>R)   3. Cervical foraminal stenosis (Right)   4. Chronic cervical radicular pain (Right) (intermittent)   5. Chronic upper extremity pain (Right)     Problems updated and reviewed during this visit: Problem  Memory Loss   Plan of Care  Pharmacotherapy (Medications Ordered): No orders of the defined types were placed in this encounter.  New Prescriptions   No medications on file   Medications administered today: Mr. Mirabal had no medications administered during this visit.  Orders:  Procedure Orders     Cervical Epidural Injection Lab Orders  No laboratory test(s) ordered today   Imaging Orders  No imaging studies ordered today    Referral Orders     Ambulatory referral to Neurosurgery  Interventional management options: Planned, scheduled, and/or pending:   Not at this time.  Considering:   Diagnostic right-sided cervical epidural steroid injectionunder fluoroscopic guidance (series of 3 completed) Diagnostic bilateral cervical facet blockunder fluoroscopic guidance  Possible bilateral cervical facet radiofrequencyablation  Bilateral intra-articular shoulder joint injectionunder fluoroscopic guidance  Diagnostic bilateral suprascapular nerve blockunder fluoroscopic guidance  Possible bilateral suprascapular nerve radiofrequencyablation  Diagnostic bilateral lumbar facet blockunder fluoroscopic guidance  Possible bilateral lumbar facet radiofrequencyablation  Diagnostic right-sided intra-articular knee joint injectionwith local anesthetic and steroid  Diagnostic right-sided genicular nerve blocksunder fluoroscopic guidance  Possible right-sided genicular nerve radiofrequencyablation    Palliative PRN treatment(s):   Palliative right-sided cervical epidural steroid injection    Provider-requested follow-up: Return for PRN Procedure.  Future Appointments Date Time Provider [place]  05/23/2017 11:00 AM Kandis Nab, PsyD [facility] None  05/30/2017 8:00 AM [facility] None  06/13/2017 10:00 AM Kandis Nab, PsyD [facility] None  06/20/2017 1:30 PM Cameron Sprang, MD [facility] None   Primary Care Physician: Leone Haven, MD Location: [facility] Note by: Gaspar Cola, MD Date: 03/06/2017; Time: 1:24 PM  Patient Instructions  ____________________________________________________________________________________________  Preparing for Procedure with Sedation Instructions: . Oral Intake: Do not eat or drink anything for at least 8 hours prior to your procedure. . Transportation: Public transportation is not allowed. Bring an adult driver. The driver must be physically present in our waiting room before any procedure can be started. Marland Kitchen Physical Assistance: Bring an  adult physically capable of assisting you, in the event you need help. This adult should keep you company at home for at least 6 hours after the procedure. . Blood Pressure Medicine: Take your blood pressure medicine with a sip of water the morning of the procedure. . Blood thinners:  . Diabetics on insulin: Notify the staff so that you can be scheduled 1st case in the morning. If your diabetes requires high dose insulin, take only  of your normal insulin dose the morning of the procedure and notify the staff that you have done so. . Preventing infections: Shower with an antibacterial soap the morning of your procedure. . Build-up your immune system: Take 1000 mg of Vitamin C with every meal (3 times a day) the day prior to your procedure. Marland Kitchen Antibiotics: Inform the staff if you have a condition or reason that requires you to take antibiotics before dental procedures. . Pregnancy: If you are pregnant, call and cancel the procedure. . Sickness: If you have a cold, fever, or any active infections, call and cancel the procedure. . Arrival: You must be in the facility at least 30 minutes prior to your scheduled procedure. . Children: Do not bring children with you. . Dress appropriately: Bring dark clothing that you would not mind if they get stained. . Valuables: Do not bring any jewelry or valuables. Procedure appointments are reserved for interventional treatments only. Marland Kitchen No Prescription Refills. . No medication changes will be discussed during procedure appointments. . No disability issues will be discussed. ____________________________________________________________________________________________

## 2017-03-06 NOTE — Patient Instructions (Signed)

## 2017-03-09 ENCOUNTER — Telehealth: Payer: Self-pay | Admitting: Family Medicine

## 2017-03-09 NOTE — Telephone Encounter (Signed)
Left pt message asking to call Ebony Hail back directly at [phone] to schedule AWV. Thanks!  *NOTE* No show on 01/06/17---Never had AWV before--started Medicare in 2011

## 2017-04-11 ENCOUNTER — Ambulatory Visit: Payer: Medicare Other | Admitting: Pain Medicine

## 2017-04-12 NOTE — Telephone Encounter (Signed)
Scheduled 04/19/17

## 2017-04-19 ENCOUNTER — Ambulatory Visit: Payer: Medicare Other

## 2017-05-03 ENCOUNTER — Ambulatory Visit: Payer: Medicare Other

## 2017-05-05 ENCOUNTER — Ambulatory Visit: Payer: Medicare Other

## 2017-05-23 ENCOUNTER — Encounter: Payer: Self-pay | Admitting: Psychology

## 2017-05-23 ENCOUNTER — Ambulatory Visit (INDEPENDENT_AMBULATORY_CARE_PROVIDER_SITE_OTHER): Payer: Medicare Other | Admitting: Psychology

## 2017-05-23 DIAGNOSIS — R413 Other amnesia: Secondary | ICD-10-CM

## 2017-05-23 DIAGNOSIS — F4310 Post-traumatic stress disorder, unspecified: Secondary | ICD-10-CM

## 2017-05-23 DIAGNOSIS — F3130 Bipolar disorder, current episode depressed, mild or moderate severity, unspecified: Secondary | ICD-10-CM | POA: Diagnosis not present

## 2017-05-23 DIAGNOSIS — G40009 Localization-related (focal) (partial) idiopathic epilepsy and epileptic syndromes with seizures of localized onset, not intractable, without status epilepticus: Secondary | ICD-10-CM
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
## 2017-05-24 DIAGNOSIS — M5030 Other cervical disc degeneration, unspecified cervical region: Secondary | ICD-10-CM | POA: Insufficient documentation

## 2017-05-24 DIAGNOSIS — M9981 Other biomechanical lesions of cervical region: Secondary | ICD-10-CM | POA: Diagnosis not present

## 2017-05-24 DIAGNOSIS — M542 Cervicalgia: Secondary | ICD-10-CM | POA: Diagnosis not present

## 2017-05-24 DIAGNOSIS — M5412 Radiculopathy, cervical region: Secondary | ICD-10-CM | POA: Diagnosis not present

## 2017-05-24 HISTORY — DX: Other cervical disc degeneration, unspecified cervical region: M50.30

## 2017-05-30 ENCOUNTER — Ambulatory Visit (INDEPENDENT_AMBULATORY_CARE_PROVIDER_SITE_OTHER): Payer: Medicare Other | Admitting: Psychology

## 2017-05-30 DIAGNOSIS — G40009 Localization-related (focal) (partial) idiopathic epilepsy and epileptic syndromes with seizures of localized onset, not intractable, without status epilepticus: Secondary | ICD-10-CM

## 2017-05-30 DIAGNOSIS — R413 Other amnesia: Secondary | ICD-10-CM

## 2017-05-30 NOTE — Progress Notes (Signed)
   Neuropsychology Note  Derelle Cockrell returned today for 3 hours of neuropsychological testing with technician, Milana Kidney, BS, under the supervision of Dr. Macarthur Critchley. The patient did not appear overtly distressed by the testing session, per behavioral observation or via self-report to the technician. Rest breaks were offered. Patrick Gregory will return within 2 weeks for a feedback session with Dr. Si Raider at which time his test performances, clinical impressions and treatment recommendations will be reviewed in detail. The patient understands he can contact our office should he require our assistance before this time.  Full report to follow.

## 2017-06-01 ENCOUNTER — Telehealth: Payer: Self-pay | Admitting: Family Medicine

## 2017-06-01 DIAGNOSIS — K921 Melena: Secondary | ICD-10-CM | POA: Diagnosis not present

## 2017-06-01 NOTE — Telephone Encounter (Signed)
Noted and agree. 

## 2017-06-01 NOTE — Telephone Encounter (Signed)
Reason for call: blood in stool  Symptoms: present since Monday  Duration bowel movements normal in form , bright red blood present in commode small amount,  noticed small amount  when wipes, no recent last colonoscopy 2016 , no  abdominal pain , no straining when reported bowel movement , no abdominal pain, no fever,  scheduled upcoming appointment GI for stomach issues 07/21/17 , no history of hemorrhoids, not on regular stool softener  Medications: pantoprazole , zantac  Last seen for this problem: Seen by: Lives in [place] offered to schedule appointment at another office ,  Will go to urgent care for evaluation

## 2017-06-01 NOTE — Telephone Encounter (Signed)
Spoke with patients wife patient was evaluated at  local urgent([facility] in [place] for rectal bleeding  checked  Blood count and it was  normal , He was referred to local GI urgent  appointment .  Informed if rectal bleeding persisted or increased to go to  ER . Patients wife verbalized understanding. They still would like to keep appointment on schedule with you on Monday.

## 2017-06-01 NOTE — Telephone Encounter (Signed)
Pt wife states that pt is having blood in his stool and all wanted to see Dr. Caryl Bis. Scheduled him for Monday.Marland Kitchen

## 2017-06-01 NOTE — Telephone Encounter (Signed)
Please triage

## 2017-06-01 NOTE — Telephone Encounter (Signed)
Noted. Thanks.

## 2017-06-05 ENCOUNTER — Ambulatory Visit (INDEPENDENT_AMBULATORY_CARE_PROVIDER_SITE_OTHER): Payer: Medicare Other | Admitting: Family Medicine

## 2017-06-05 ENCOUNTER — Encounter: Payer: Self-pay | Admitting: Family Medicine

## 2017-06-05 VITALS — BP 140/88 | HR 89 | Temp 97.9°F | Wt 226.2 lb

## 2017-06-05 DIAGNOSIS — K625 Hemorrhage of anus and rectum: Secondary | ICD-10-CM | POA: Insufficient documentation

## 2017-06-05 DIAGNOSIS — Z86010 Personal history of colonic polyps: Secondary | ICD-10-CM | POA: Diagnosis not present

## 2017-06-05 DIAGNOSIS — K5900 Constipation, unspecified: Secondary | ICD-10-CM | POA: Diagnosis not present

## 2017-06-05 DIAGNOSIS — J449 Chronic obstructive pulmonary disease, unspecified: Secondary | ICD-10-CM | POA: Diagnosis not present

## 2017-06-05 DIAGNOSIS — J0101 Acute recurrent maxillary sinusitis: Secondary | ICD-10-CM | POA: Diagnosis not present

## 2017-06-05 MED ORDER — AMOXICILLIN-POT CLAVULANATE 875-125 MG PO TABS: 1.0000 | ORAL_TABLET | Freq: Two times a day (BID) | ORAL | 0 refills | Status: DC

## 2017-06-05 MED ORDER — ALBUTEROL SULFATE HFA 108 (90 BASE) MCG/ACT IN AERS: 2.0000 | INHALATION_SPRAY | Freq: Four times a day (QID) | RESPIRATORY_TRACT | 2 refills | Status: AC | PRN

## 2017-06-05 MED ORDER — BUDESONIDE-FORMOTEROL FUMARATE 80-4.5 MCG/ACT IN AERO: 2.0000 | INHALATION_SPRAY | Freq: Two times a day (BID) | RESPIRATORY_TRACT | 3 refills | Status: DC

## 2017-06-05 NOTE — Progress Notes (Signed)
[name], MD Phone: [phone]  Patrick Gregory is a 56 y.o. male who presents today for same-day visit.  Cough/congestion: Patient notes for the last week he has had sinus congestion.  He is coughing clear mucus up.  Blowing clear mucus out of his nose.  Has sore throat.  No fevers.  No chest pain.  He notes some wheezing intermittently.  Minimal dyspnea.  Has been using his inhalers for his COPD.  He tried Alka-Seltzer plus with some benefit.  Prior to this he noted his COPD was doing well.  He wants to get set up with a new pulmonologist.  Rectal bleeding: Patient evaluated at urgent care last week.  Notes no recurrent bleeding though he has not had a bowel movement since last Wednesday.  He saw GI earlier today and they advised him on methods for constipation and they have set him up for a colonoscopy in December.  Notes no abdominal pain.  No other GI symptoms.  Smoker   ROS see history of present illness  Objective  Physical Exam Vitals:   06/05/17 1332  BP: 140/88  Pulse: 89  Temp: 97.9 F (36.6 C)  SpO2: 97%    BP Readings from Last 3 Encounters:  06/05/17 140/88  03/06/17 (!) 141/88  02/14/17 126/78   Wt Readings from Last 3 Encounters:  06/05/17 226 lb 3.2 oz (102.6 kg)  03/06/17 202 lb (91.6 kg)  02/14/17 209 lb (94.8 kg)    Physical Exam  Constitutional: No distress.  HENT:  Head: Normocephalic and atraumatic.  Mouth/Throat: Oropharynx is clear and moist. No oropharyngeal exudate.  Normal TMs  Eyes: Conjunctivae are normal. Pupils are equal, round, and reactive to light.  Cardiovascular: Normal rate, regular rhythm and normal heart sounds.  Pulmonary/Chest: Effort normal and breath sounds normal.  Abdominal: Soft. Bowel sounds are normal. He exhibits no distension. There is no tenderness.  Skin: He is not diaphoretic.     Assessment/Plan: Please see individual problem list.  COPD (chronic obstructive pulmonary disease) ([place]) Chronic issue.   Refill inhalers.  Refer to pulmonology.  Sinusitis Symptoms and duration consistent with sinusitis.  Will treat with Augmentin.  Given return precautions.  Rectal bleeding Evaluated by GI today.  Benign abdominal exam.  He will follow their recommendations for constipation.  Colonoscopy as scheduled.  Given return precautions.   Orders Placed This Encounter  Procedures  . Ambulatory referral to Pulmonology    Referral Priority:   Routine    Referral Type:   Consultation    Referral Reason:   Specialty Services Required    Requested Specialty:   Pulmonary Disease    Number of Visits Requested:   1    Meds ordered this encounter  Medications  . budesonide-formoterol (SYMBICORT) 80-4.5 MCG/ACT inhaler    Sig: Inhale 2 puffs 2 (two) times daily into the lungs.    Dispense:  3 Inhaler    Refill:  3  . DISCONTD: amoxicillin-clavulanate (AUGMENTIN) 875-125 MG tablet    Sig: Take 1 tablet 2 (two) times daily by mouth.    Dispense:  14 tablet    Refill:  0  . albuterol (PROVENTIL HFA;VENTOLIN HFA) 108 (90 Base) MCG/ACT inhaler    Sig: Inhale 2 puffs every 6 (six) hours as needed into the lungs for wheezing or shortness of breath.    Dispense:  3 Inhaler    Refill:  2  . amoxicillin-clavulanate (AUGMENTIN) 875-125 MG tablet    Sig: Take 1 tablet 2 (two) times  daily by mouth.    Dispense:  14 tablet    Refill:  0   [name], MD [place]

## 2017-06-05 NOTE — Assessment & Plan Note (Signed)
Evaluated by GI today.  Benign abdominal exam.  He will follow their recommendations for constipation.  Colonoscopy as scheduled.  Given return precautions.

## 2017-06-05 NOTE — Assessment & Plan Note (Signed)
Symptoms and duration consistent with sinusitis.  Will treat with Augmentin.  Given return precautions.

## 2017-06-05 NOTE — Assessment & Plan Note (Signed)
Chronic issue.  Refill inhalers.  Refer to pulmonology.

## 2017-06-05 NOTE — Patient Instructions (Signed)
Nice to see you. We will treat you with Augmentin for your upper respiratory sinus infection. We will get you to see a new pulmonologist. Please follow-up with GI as planned.
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
## 2017-06-13 ENCOUNTER — Ambulatory Visit (INDEPENDENT_AMBULATORY_CARE_PROVIDER_SITE_OTHER): Payer: Medicare Other | Admitting: Psychology

## 2017-06-13 ENCOUNTER — Encounter: Payer: Self-pay | Admitting: Psychology

## 2017-06-13 DIAGNOSIS — G40009 Localization-related (focal) (partial) idiopathic epilepsy and epileptic syndromes with seizures of localized onset, not intractable, without status epilepticus: Secondary | ICD-10-CM | POA: Diagnosis not present

## 2017-06-13 DIAGNOSIS — R413 Other amnesia: Secondary | ICD-10-CM

## 2017-06-13 NOTE — Patient Instructions (Signed)
   The effect of depression and anxiety on your cognitive functioning: . One of the typical symptoms of depression is difficulty concentrating and making decisions, and various types of anxiety also interfere with attention and concentration . Problems with attention and concentration can disrupt the process of learning and making new memories, which can make it seem like there is a problem with your memory. In your daily life, you may experience this disruption as forgetting names and appointments, misplacing items, and needing to make lists for shopping and errands. It may be harder for you to stay focused on tasks and feel as "sharp" as you did in the past.  . Also, when we are depressed or anxious, we often pay more attention to our difficulties (rather than our strengths) in our daily life, and this can make it seem to us like we are doing worse cognitively than we really are. . The cognitive aspects of depression and anxiety are sometimes observed as an identifiable pattern of poor performance on a neuropsychological evaluation, but it is also possible that all scores on an evaluation are within normal limits. . Regardless of the test scores, distress related to depression and anxiety can interfere with the ability to make use of your cognitive resources and function optimally across settings such as work or school, maintaining the home and responsibilities, and personal relationships. . Fortunately, there are treatments for depression and anxiety, and when mood improves, cognitive functioning in daily life often improves. . Treatment options include psychotherapy, medications (e.g., antidepressants), and behavioral changes, such as increasing your involvement in enjoyable activities, increasing the amount of exercise you are getting, and maintaining a regular routine.   

## 2017-06-20 ENCOUNTER — Encounter: Payer: Self-pay | Admitting: Neurology

## 2017-06-20 ENCOUNTER — Ambulatory Visit (INDEPENDENT_AMBULATORY_CARE_PROVIDER_SITE_OTHER): Payer: Medicare Other | Admitting: Neurology

## 2017-06-20 VITALS — BP 132/84 | HR 90 | Ht 69.0 in | Wt 228.0 lb

## 2017-06-20 DIAGNOSIS — F3130 Bipolar disorder, current episode depressed, mild or moderate severity, unspecified: Secondary | ICD-10-CM

## 2017-06-20 DIAGNOSIS — F4310 Post-traumatic stress disorder, unspecified: Secondary | ICD-10-CM

## 2017-06-20 DIAGNOSIS — G40009 Localization-related (focal) (partial) idiopathic epilepsy and epileptic syndromes with seizures of localized onset, not intractable, without status epilepticus: Secondary | ICD-10-CM

## 2017-06-20 MED ORDER — LAMOTRIGINE 100 MG PO TABS: 100.0000 mg | ORAL_TABLET | Freq: Two times a day (BID) | ORAL | 3 refills | Status: DC

## 2017-06-20 NOTE — Progress Notes (Signed)
NEUROLOGY FOLLOW UP OFFICE NOTE  Charlies Rayburn [id number]  DOB: [birth year]  HISTORY OF PRESENT ILLNESS: I had the pleasure of seeing Jaysin Gayler in follow-up in the neurology clinic on 06/20/2017.  He is again accompanied by his wife who helps supplement the history today. He has a Neurosurgeon in training. The patient was last seen 4 months ago for recurrent episodes concerning for seizures with episodes of unresponsiveness with falls. They continued to report episodes on Keppra 500mg  BID and were wondering about psychogenic seizures. He had a 48-hour EEG done which showed occasional focal slowing over the left temporal region, no epileptiform discharges seen, typical events not captured. He was reporting a seizure every 1-2 weeks, his wife would come home and find a goose egg on his head. Lamotrigine dose was increased to 100mg  BID, and he was tapered off Keppra. He has not had any further episodes since the beginning of August. He was also reporting memory loss and underwent Neuropsychological evaluation with a diagnosis of Bipolar disorder, current episode depressed; PTSD (by history). Per report, "Results of cognitive testing could not be interpreted due to evidence of sub-optimal effort on formal testing. A finding of sub-optimal effort in combination with severely impaired performances across all aspects of testing is commonly seen in individuals with somatoform disorders such as PNES. Additionally, he presents with significant emotional distress and relatively severe psychopathology including bipolar disorder (currently depressed with suicidal ideation) and PTSD; primary psychiatric disorders such as these are often present in individuals with PNES as well. Regardless of whether or not he has PNES, it is clear that his psychiatric disorders are significantly and negatively affecting his daily functioning and likely his cognitive functioning to at least some degree. Ongoing and intensive mental health  treatment is certainly indicated, including ongoing risk assessment and crisis intervention as indicated." He continues to see a psychiatrist and a therapist every month.  HPI 03/03/2016: This is a pleasant 56 yo RH man with a history of COPD, chronic neck and back pain, PTSD, who presented for seizures. He reports that the first seizure occurred 3-4 years ago, he woke up on the floor then 2-3 hours later passed out again. He denied any prior warning symptoms. He was started on an unrecalled seizure medication which he took for a year then self-discontinued because he had been seizure-free.  He was event-free until January 2017 when he had 3 episodes in a week. The first episode he was sitting on the couch when his head went back and he became unresponsive for a few seconds, eyes were closed. The next day, he was working on something on the wall when his uncle noticed he looked lost. He called to him and he snapped back. The third episode occurred 2 days later, again while sitting on the couch, his head went back with eyes closed, then he had mild jerking of both arms for a few seconds. No tongue bite or incontinence. When he came to, he told his fiancee he had a headache and felt nauseated. He had another mild episode 3 weeks ago. His is amnestic of these episodes, his whole body feels weak after, no focal weakness. He and his fiancee deny any staring/unresponsive episodes, no gaps in time. He denies any  olfactory/gustatory hallucinations, deja vu, rising epigastric sensation, myoclonic jerks. When he turns his head to the right, he has numbness over the left arm. He reports having degenerative disc disease and sees neurosurgery. He was started on Keppra 500mg  and  denies any side effects since taking it the past 3 days. He was in the ER last 02/25/16 for gait difficulty, he was staggering, walking like was drunk. He was evaluated by Neurology at [facility], no focal weakness seen, finger to nose testing appeared  functional. Symptoms lasted 2-3 days. He had not started Keppra when these symptoms began. He had an MRI brain with and without contrast which I personally reviewed, there were no acute changes, hippocampi symmetric with no abnormal signal or enhancement seen. He had a head CT the next day with no acute changes. The gait instability has since resolved.  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 He has had several head injuries with loss of consciousness while he was in [employer], otherwise he had a normal birth and early development.  There is no history of febrile convulsions, CNS infections such as meningitis/encephalitis, neurosurgical procedures, or family history of seizures.  PAST MEDICAL HISTORY: Past Medical History:  Diagnosis Date  . Allergic rhinitis   . Arthritis    shoulders, back  . Asthma   . Bipolar disorder ([place])   . Chickenpox   . COPD (chronic obstructive pulmonary disease) ([place])   . Depression   . Low back pain   . Seizures ([place])    last one approx 18 mos ago.  No meds approx 1 yr.  . Sleep apnea    supposed to use CPAP. Does not have.  . Stiff neck    limited side to side turn.  Left side pain when looking up.  . Throat cancer [facility])     MEDICATIONS: Current Outpatient Medications on File Prior to Visit  Medication Sig Dispense Refill  . albuterol (PROVENTIL HFA;VENTOLIN HFA) 108 (90 Base) MCG/ACT inhaler Inhale 2 puffs every 6 (six) hours as needed into the lungs for wheezing or shortness of breath. 3 Inhaler 2  . amoxicillin-clavulanate (AUGMENTIN) 875-125 MG tablet Take 1 tablet 2 (two) times daily by mouth. 14 tablet 0  . budesonide-formoterol (SYMBICORT) 80-4.5 MCG/ACT inhaler Inhale 2 puffs 2 (two) times daily into the lungs. 3 Inhaler 3  . cholecalciferol (VITAMIN D) 400 units TABS tablet Take 1,200 Units by mouth.    . clonazePAM  (KLONOPIN) 1 MG tablet Take 1 mg 3 (three) times daily by mouth.     . fluticasone (FLONASE) 50 MCG/ACT nasal spray Place 2 sprays into both nostrils daily.    Marland Kitchen ipratropium-albuterol (DUONEB) 0.5-2.5 (3) MG/3ML SOLN Take 3 mLs by nebulization 2 (two) times daily.    Marland Kitchen lamoTRIgine (LAMICTAL) 100 MG tablet Take 1 tablet (100 mg total) by mouth 2 (two) times daily. 180 tablet 3  . Lurasidone HCl 60 MG TABS Take 80 mg by mouth at bedtime.     . pantoprazole (PROTONIX) 40 MG tablet Take 1 tablet (40 mg total) by mouth 2 (two) times daily before a meal. 60 tablet 3  . ranitidine (ZANTAC) 300 MG tablet Take 300 mg by mouth at bedtime.    . sildenafil (VIAGRA) 100 MG tablet Take 100 mg by mouth daily as needed for erectile dysfunction.    . cyclobenzaprine (FLEXERIL) 10 MG tablet Take 1 tablet (10 mg total) by mouth 3 (three) times daily. 90 tablet 0  . pregabalin (LYRICA) 75 MG capsule Take 1 capsule (75 mg total) by mouth 2 (two) times daily. 60 capsule 0   No  current facility-administered medications on file prior to visit.     ALLERGIES: No Known Allergies  FAMILY HISTORY: Family History  Problem Relation Age of Onset  . Alcoholism Unknown   . Arthritis Unknown   . Hyperlipidemia Unknown   . Heart disease Unknown   . Sudden death Unknown   . Kidney disease Unknown   . Mental illness Unknown   . Depression Mother   . Bipolar disorder Mother   . Depression Brother   . Bipolar disorder Brother   . Heart disease Brother   . Heart disease Brother     SOCIAL HISTORY: Social History   Socioeconomic History  . Marital status: Single    Spouse name: Not on file  . Number of children: Not on file  . Years of education: Not on file  . Highest education level: Not on file  Social Needs  . Financial resource strain: Not on file  . Food insecurity - worry: Not on file  . Food insecurity - inability: Not on file  . Transportation needs - medical: Not on file  . Transportation needs -  non-medical: Not on file  Occupational History  . Occupation: retired    Comment: Nature conservation officer  Tobacco Use  . Smoking status: Former Smoker    Packs/day: 1.00    Years: 35.00    Pack years: 35.00    Types: Cigarettes    Last attempt to quit: 05/01/2014    Years since quitting: 3.1  . Smokeless tobacco: Never Used  Substance and Sexual Activity  . Alcohol use: No    Alcohol/week: 0.0 oz  . Drug use: No  . Sexual activity: Not on file  Other Topics Concern  . Not on file  Social History Narrative  . Not on file    REVIEW OF SYSTEMS: Constitutional: No fevers, chills, or sweats, no generalized fatigue, change in appetite Eyes: No visual changes, double vision, eye pain Ear, nose and throat: No hearing loss, ear pain, nasal congestion, sore throat Cardiovascular: No chest pain, palpitations Respiratory:  No shortness of breath at rest or with exertion, wheezes GastrointestinaI: No nausea, vomiting, diarrhea, abdominal pain, fecal incontinence Genitourinary:  No dysuria, urinary retention or frequency Musculoskeletal:  + neck pain, back pain Integumentary: No rash, pruritus, skin lesions Neurological: as above Psychiatric: + depression, insomnia, anxiety Endocrine: No palpitations, fatigue, diaphoresis, mood swings, change in appetite, change in weight, increased thirst Hematologic/Lymphatic:  No anemia, purpura, petechiae. Allergic/Immunologic: no itchy/runny eyes, nasal congestion, recent allergic reactions, rashes  PHYSICAL EXAM: Vitals:   06/20/17 1341  BP: 132/84  Pulse: 90  SpO2: 96%   General: No acute distress Head:  Normocephalic/atraumatic Neck: supple, no paraspinal tenderness, full range of motion Heart:  Regular rate and rhythm Lungs:  Clear to auscultation bilaterally Back: No paraspinal tenderness Skin/Extremities: No rash, no edema Neurological Exam: alert and oriented to person, place, and month/year, day of week. No aphasia or dysarthria. Fund of  knowledge is appropriate.  Remote memory intact. Attention and concentration are normal.    Able to name objects and repeat phrases. Cranial nerves: Pupils equal, round, reactive to light. Extraocular movements intact with no nystagmus. Visual fields full. Facial sensation intact. No facial asymmetry. Tongue, uvula, palate midline.  Motor: Bulk and tone normal, muscle strength 5/5 throughout with no pronator drift.  Sensation to light touch intact.  No extinction to double simultaneous stimulation.  Deep tendon reflexes 2+ throughout, toes downgoing.  Finger to nose testing intact.  Gait narrow-based and  steady, able to tandem walk adequately.  Romberg negative.  IMPRESSION: This is a pleasant 56 yo RH man with a history of COPD, chronic neck and back pain, PTSD, with a history of seizures since 2013/2014. His EEG showed occasional left temporal slowing, no epileptiform discharges. MRI brain unremarkable. He continued to report seizures with falls every 1-2 weeks on Keppra, and was started on Lamotrigine to help with mood as well. He has tapered off Keppra and is taking Lamotrigine 100mg  BID, with no events since August. No side effects on Lamotrigine. Refills sent. He will let his psychiatrist know that if it is felt that Lamotrigine may help better with mood, dose can be adjusted by Behavioral health. He was reporting memory loss, Neuropsych testing indicated sub-optimal effort on formal testing. A finding of sub-optimal effort in combination with severely impaired performances across all aspects of testing is commonly seen in individuals with somatoform disorders such as PNES. He also had significant emotional distress and relatively severe psychopathology including bipolar disorder (currently depressed with suicidal ideation) and PTSD. His psychiatric disorders are significantly and negatively affecting his daily functioning and likely his cognitive functioning to at least some degree. He continues to  follow-up with Behavioral health. [place] driving laws were again discussed with the patient, and he knows to stop driving after a seizure, until 6 months seizure-free. He will follow-up in 6 months and knows to call for any changes.   Thank you for allowing me to participate in his care.  Please do not hesitate to call for any questions or concerns.  The duration of this appointment visit was 25 minutes of face-to-face time with the patient.  Greater than 50% of this time was spent in counseling, explanation of diagnosis, planning of further management, and coordination of care.   Ellouise Newer, M.D.   CC: Dr. Caryl Bis

## 2017-06-20 NOTE — Patient Instructions (Signed)
1. Continue Lamictal 100mg  twice a day 2. Continue follow-up with psychiatry and therapy 3. Follow-up in 6 months, call for any changes  Seizure precautions: 1. If medication has been prescribed for you to prevent seizures, take it exactly as directed.  Do not stop taking the medicine without talking to your doctor first, even if you have not had a seizure in a long time.   2. Avoid activities in which a seizure would cause danger to yourself or to others.  Don't operate dangerous machinery, swim alone, or climb in high or dangerous places, such as on ladders, roofs, or girders.  Do not drive unless your doctor says you may.  3. If you have any warning that you may have a seizure, lay down in a safe place where you can't hurt yourself.    4.  No driving for 6 months from last seizure, as per [facility]   Please refer to the following link on the [place] website for more information: [email or web address]   5.  Maintain good sleep hygiene. Avoid alcohol.  6.  Contact your doctor if you have any problems that may be related to the medicine you are taking.  7.  Call 911 and bring the patient back to the ED if:        A.  The seizure lasts longer than 5 minutes.       B.  The patient doesn't awaken shortly after the seizure  C.  The patient has new problems such as difficulty seeing, speaking or moving  D.  The patient was injured during the seizure  E.  The patient has a temperature over 102 F (39C)  F.  The patient vomited and now is having trouble breathing

## 2017-06-30 ENCOUNTER — Institutional Professional Consult (permissible substitution): Payer: Medicare Other | Admitting: Internal Medicine

## 2017-07-03 DIAGNOSIS — K648 Other hemorrhoids: Secondary | ICD-10-CM | POA: Diagnosis not present

## 2017-07-03 DIAGNOSIS — Z86010 Personal history of colonic polyps: Secondary | ICD-10-CM | POA: Diagnosis not present

## 2017-07-03 DIAGNOSIS — D123 Benign neoplasm of transverse colon: Secondary | ICD-10-CM | POA: Diagnosis not present

## 2017-08-15 ENCOUNTER — Institutional Professional Consult (permissible substitution): Payer: Medicare Other | Admitting: Internal Medicine

## 2017-08-29 ENCOUNTER — Institutional Professional Consult (permissible substitution): Payer: Medicare Other | Admitting: Internal Medicine

## 2017-09-05 ENCOUNTER — Ambulatory Visit: Payer: Medicare Other | Admitting: Family Medicine

## 2017-10-01 DIAGNOSIS — K4090 Unilateral inguinal hernia, without obstruction or gangrene, not specified as recurrent: Secondary | ICD-10-CM | POA: Diagnosis not present

## 2017-10-01 DIAGNOSIS — F319 Bipolar disorder, unspecified: Secondary | ICD-10-CM | POA: Diagnosis not present

## 2017-10-01 DIAGNOSIS — K760 Fatty (change of) liver, not elsewhere classified: Secondary | ICD-10-CM | POA: Diagnosis not present

## 2017-10-01 DIAGNOSIS — R110 Nausea: Secondary | ICD-10-CM | POA: Diagnosis not present

## 2017-10-01 DIAGNOSIS — R05 Cough: Secondary | ICD-10-CM | POA: Diagnosis not present

## 2017-10-01 DIAGNOSIS — R1032 Left lower quadrant pain: Secondary | ICD-10-CM | POA: Diagnosis not present

## 2017-10-01 DIAGNOSIS — R160 Hepatomegaly, not elsewhere classified: Secondary | ICD-10-CM | POA: Diagnosis not present

## 2017-10-01 DIAGNOSIS — K5730 Diverticulosis of large intestine without perforation or abscess without bleeding: Secondary | ICD-10-CM | POA: Diagnosis not present

## 2017-10-01 DIAGNOSIS — K5900 Constipation, unspecified: Secondary | ICD-10-CM | POA: Diagnosis not present

## 2017-10-01 DIAGNOSIS — Z79899 Other long term (current) drug therapy: Secondary | ICD-10-CM | POA: Diagnosis not present

## 2017-10-01 DIAGNOSIS — Z87891 Personal history of nicotine dependence: Secondary | ICD-10-CM | POA: Diagnosis not present

## 2017-10-23 DIAGNOSIS — H1013 Acute atopic conjunctivitis, bilateral: Secondary | ICD-10-CM | POA: Diagnosis not present

## 2017-10-23 DIAGNOSIS — M545 Low back pain: Secondary | ICD-10-CM | POA: Diagnosis not present

## 2017-10-23 DIAGNOSIS — R4584 Anhedonia: Secondary | ICD-10-CM | POA: Diagnosis not present

## 2017-10-23 DIAGNOSIS — Z8619 Personal history of other infectious and parasitic diseases: Secondary | ICD-10-CM | POA: Diagnosis not present

## 2017-10-23 DIAGNOSIS — R7309 Other abnormal glucose: Secondary | ICD-10-CM | POA: Diagnosis not present

## 2017-10-23 DIAGNOSIS — R799 Abnormal finding of blood chemistry, unspecified: Secondary | ICD-10-CM | POA: Diagnosis not present

## 2017-10-23 DIAGNOSIS — R030 Elevated blood-pressure reading, without diagnosis of hypertension: Secondary | ICD-10-CM | POA: Diagnosis not present

## 2017-10-23 DIAGNOSIS — F319 Bipolar disorder, unspecified: Secondary | ICD-10-CM | POA: Diagnosis not present

## 2017-10-23 DIAGNOSIS — M5412 Radiculopathy, cervical region: Secondary | ICD-10-CM | POA: Diagnosis not present

## 2017-10-23 DIAGNOSIS — Z720 Tobacco use: Secondary | ICD-10-CM | POA: Insufficient documentation

## 2017-10-23 DIAGNOSIS — E559 Vitamin D deficiency, unspecified: Secondary | ICD-10-CM | POA: Diagnosis not present

## 2017-10-23 DIAGNOSIS — J309 Allergic rhinitis, unspecified: Secondary | ICD-10-CM | POA: Diagnosis not present

## 2017-10-23 DIAGNOSIS — G8929 Other chronic pain: Secondary | ICD-10-CM | POA: Diagnosis not present

## 2017-10-23 DIAGNOSIS — Z1159 Encounter for screening for other viral diseases: Secondary | ICD-10-CM | POA: Diagnosis not present

## 2017-10-23 DIAGNOSIS — F17210 Nicotine dependence, cigarettes, uncomplicated: Secondary | ICD-10-CM | POA: Insufficient documentation

## 2017-10-23 HISTORY — DX: Tobacco use: Z72.0

## 2017-10-25 DIAGNOSIS — Z8619 Personal history of other infectious and parasitic diseases: Secondary | ICD-10-CM

## 2017-10-25 HISTORY — DX: Personal history of other infectious and parasitic diseases: Z86.19

## 2017-11-13 DIAGNOSIS — R4584 Anhedonia: Secondary | ICD-10-CM | POA: Diagnosis not present

## 2017-11-13 DIAGNOSIS — R799 Abnormal finding of blood chemistry, unspecified: Secondary | ICD-10-CM | POA: Diagnosis not present

## 2017-11-13 DIAGNOSIS — Z8619 Personal history of other infectious and parasitic diseases: Secondary | ICD-10-CM | POA: Diagnosis not present

## 2017-11-13 DIAGNOSIS — R7309 Other abnormal glucose: Secondary | ICD-10-CM | POA: Diagnosis not present

## 2017-11-13 DIAGNOSIS — E559 Vitamin D deficiency, unspecified: Secondary | ICD-10-CM | POA: Diagnosis not present

## 2017-11-13 DIAGNOSIS — Z1159 Encounter for screening for other viral diseases: Secondary | ICD-10-CM | POA: Diagnosis not present

## 2017-11-13 DIAGNOSIS — F319 Bipolar disorder, unspecified: Secondary | ICD-10-CM | POA: Diagnosis not present

## 2017-11-13 DIAGNOSIS — F17210 Nicotine dependence, cigarettes, uncomplicated: Secondary | ICD-10-CM | POA: Diagnosis not present

## 2017-11-13 DIAGNOSIS — R030 Elevated blood-pressure reading, without diagnosis of hypertension: Secondary | ICD-10-CM | POA: Diagnosis not present

## 2017-11-20 DIAGNOSIS — E782 Mixed hyperlipidemia: Secondary | ICD-10-CM | POA: Diagnosis not present

## 2017-11-20 DIAGNOSIS — R7989 Other specified abnormal findings of blood chemistry: Secondary | ICD-10-CM | POA: Diagnosis not present

## 2017-11-20 DIAGNOSIS — M545 Low back pain: Secondary | ICD-10-CM | POA: Diagnosis not present

## 2017-11-20 DIAGNOSIS — R7301 Impaired fasting glucose: Secondary | ICD-10-CM | POA: Diagnosis not present

## 2017-11-20 DIAGNOSIS — R270 Ataxia, unspecified: Secondary | ICD-10-CM | POA: Diagnosis not present

## 2017-11-20 DIAGNOSIS — G40009 Localization-related (focal) (partial) idiopathic epilepsy and epileptic syndromes with seizures of localized onset, not intractable, without status epilepticus: Secondary | ICD-10-CM | POA: Diagnosis not present

## 2017-11-20 DIAGNOSIS — Z9181 History of falling: Secondary | ICD-10-CM | POA: Diagnosis not present

## 2017-11-20 DIAGNOSIS — G8929 Other chronic pain: Secondary | ICD-10-CM | POA: Diagnosis not present

## 2017-11-20 DIAGNOSIS — F17210 Nicotine dependence, cigarettes, uncomplicated: Secondary | ICD-10-CM | POA: Diagnosis not present

## 2017-11-20 DIAGNOSIS — M5412 Radiculopathy, cervical region: Secondary | ICD-10-CM | POA: Diagnosis not present

## 2017-11-20 DIAGNOSIS — E559 Vitamin D deficiency, unspecified: Secondary | ICD-10-CM | POA: Diagnosis not present

## 2017-11-20 DIAGNOSIS — Z0000 Encounter for general adult medical examination without abnormal findings: Secondary | ICD-10-CM | POA: Diagnosis not present

## 2017-11-20 DIAGNOSIS — R030 Elevated blood-pressure reading, without diagnosis of hypertension: Secondary | ICD-10-CM | POA: Diagnosis not present

## 2017-11-21 DIAGNOSIS — R030 Elevated blood-pressure reading, without diagnosis of hypertension: Secondary | ICD-10-CM | POA: Insufficient documentation

## 2017-11-21 HISTORY — DX: Elevated blood-pressure reading, without diagnosis of hypertension: R03.0

## 2017-12-08 DIAGNOSIS — M5136 Other intervertebral disc degeneration, lumbar region: Secondary | ICD-10-CM | POA: Diagnosis not present

## 2017-12-08 DIAGNOSIS — R030 Elevated blood-pressure reading, without diagnosis of hypertension: Secondary | ICD-10-CM | POA: Diagnosis not present

## 2017-12-08 DIAGNOSIS — R400 Somnolence: Secondary | ICD-10-CM | POA: Diagnosis not present

## 2017-12-08 DIAGNOSIS — G8194 Hemiplegia, unspecified affecting left nondominant side: Secondary | ICD-10-CM | POA: Diagnosis not present

## 2017-12-08 DIAGNOSIS — I639 Cerebral infarction, unspecified: Secondary | ICD-10-CM | POA: Diagnosis not present

## 2017-12-08 DIAGNOSIS — R29898 Other symptoms and signs involving the musculoskeletal system: Secondary | ICD-10-CM | POA: Insufficient documentation

## 2017-12-08 DIAGNOSIS — G459 Transient cerebral ischemic attack, unspecified: Secondary | ICD-10-CM | POA: Diagnosis not present

## 2017-12-08 DIAGNOSIS — Z8619 Personal history of other infectious and parasitic diseases: Secondary | ICD-10-CM | POA: Diagnosis not present

## 2017-12-08 DIAGNOSIS — G40009 Localization-related (focal) (partial) idiopathic epilepsy and epileptic syndromes with seizures of localized onset, not intractable, without status epilepticus: Secondary | ICD-10-CM | POA: Diagnosis not present

## 2017-12-08 DIAGNOSIS — R4701 Aphasia: Secondary | ICD-10-CM

## 2017-12-08 DIAGNOSIS — F4310 Post-traumatic stress disorder, unspecified: Secondary | ICD-10-CM | POA: Diagnosis not present

## 2017-12-08 DIAGNOSIS — R531 Weakness: Secondary | ICD-10-CM | POA: Diagnosis not present

## 2017-12-08 DIAGNOSIS — R4781 Slurred speech: Secondary | ICD-10-CM | POA: Diagnosis not present

## 2017-12-08 DIAGNOSIS — I6782 Cerebral ischemia: Secondary | ICD-10-CM | POA: Diagnosis not present

## 2017-12-08 DIAGNOSIS — Z79899 Other long term (current) drug therapy: Secondary | ICD-10-CM | POA: Diagnosis not present

## 2017-12-08 DIAGNOSIS — Z7951 Long term (current) use of inhaled steroids: Secondary | ICD-10-CM | POA: Diagnosis not present

## 2017-12-08 DIAGNOSIS — R471 Dysarthria and anarthria: Secondary | ICD-10-CM

## 2017-12-08 DIAGNOSIS — F319 Bipolar disorder, unspecified: Secondary | ICD-10-CM | POA: Diagnosis not present

## 2017-12-08 DIAGNOSIS — Z888 Allergy status to other drugs, medicaments and biological substances status: Secondary | ICD-10-CM | POA: Diagnosis not present

## 2017-12-08 DIAGNOSIS — G4733 Obstructive sleep apnea (adult) (pediatric): Secondary | ICD-10-CM | POA: Diagnosis not present

## 2017-12-08 DIAGNOSIS — J449 Chronic obstructive pulmonary disease, unspecified: Secondary | ICD-10-CM | POA: Diagnosis not present

## 2017-12-08 DIAGNOSIS — F17210 Nicotine dependence, cigarettes, uncomplicated: Secondary | ICD-10-CM | POA: Diagnosis not present

## 2017-12-08 DIAGNOSIS — G9341 Metabolic encephalopathy: Secondary | ICD-10-CM

## 2017-12-08 DIAGNOSIS — R270 Ataxia, unspecified: Secondary | ICD-10-CM | POA: Diagnosis not present

## 2017-12-08 DIAGNOSIS — M5030 Other cervical disc degeneration, unspecified cervical region: Secondary | ICD-10-CM | POA: Diagnosis not present

## 2017-12-08 DIAGNOSIS — Z7982 Long term (current) use of aspirin: Secondary | ICD-10-CM | POA: Diagnosis not present

## 2017-12-08 HISTORY — DX: Dysarthria and anarthria: R47.1

## 2017-12-08 HISTORY — DX: Aphasia: R47.01

## 2017-12-08 HISTORY — DX: Metabolic encephalopathy: G93.41

## 2017-12-08 HISTORY — DX: Other symptoms and signs involving the musculoskeletal system: R29.898

## 2017-12-09 DIAGNOSIS — F419 Anxiety disorder, unspecified: Secondary | ICD-10-CM | POA: Diagnosis not present

## 2017-12-09 DIAGNOSIS — G9341 Metabolic encephalopathy: Secondary | ICD-10-CM | POA: Diagnosis not present

## 2017-12-09 DIAGNOSIS — R471 Dysarthria and anarthria: Secondary | ICD-10-CM | POA: Diagnosis not present

## 2017-12-09 DIAGNOSIS — I519 Heart disease, unspecified: Secondary | ICD-10-CM | POA: Diagnosis not present

## 2017-12-09 DIAGNOSIS — R29898 Other symptoms and signs involving the musculoskeletal system: Secondary | ICD-10-CM | POA: Diagnosis not present

## 2017-12-09 DIAGNOSIS — R4701 Aphasia: Secondary | ICD-10-CM | POA: Diagnosis not present

## 2017-12-09 DIAGNOSIS — J449 Chronic obstructive pulmonary disease, unspecified: Secondary | ICD-10-CM | POA: Diagnosis not present

## 2017-12-09 DIAGNOSIS — F319 Bipolar disorder, unspecified: Secondary | ICD-10-CM | POA: Diagnosis not present

## 2017-12-09 DIAGNOSIS — R270 Ataxia, unspecified: Secondary | ICD-10-CM | POA: Diagnosis not present

## 2017-12-09 DIAGNOSIS — I358 Other nonrheumatic aortic valve disorders: Secondary | ICD-10-CM | POA: Diagnosis not present

## 2017-12-09 DIAGNOSIS — F4310 Post-traumatic stress disorder, unspecified: Secondary | ICD-10-CM | POA: Diagnosis not present

## 2017-12-09 DIAGNOSIS — I517 Cardiomegaly: Secondary | ICD-10-CM | POA: Diagnosis not present

## 2017-12-11 DIAGNOSIS — I69322 Dysarthria following cerebral infarction: Secondary | ICD-10-CM | POA: Diagnosis not present

## 2017-12-11 DIAGNOSIS — I69354 Hemiplegia and hemiparesis following cerebral infarction affecting left non-dominant side: Secondary | ICD-10-CM | POA: Diagnosis not present

## 2017-12-11 MED ORDER — NABUMETONE 500 MG PO TABS
500.00 | ORAL_TABLET | ORAL | Status: DC
Start: 2017-12-09 — End: 2017-12-11

## 2017-12-11 MED ORDER — ASPIRIN EC 81 MG PO TBEC
81.00 | DELAYED_RELEASE_TABLET | ORAL | Status: DC
Start: 2017-12-10 — End: 2017-12-11

## 2017-12-11 MED ORDER — HEPARIN SODIUM (PORCINE) 5000 UNIT/ML IJ SOLN
5000.00 | INTRAMUSCULAR | Status: DC
Start: 2017-12-10 — End: 2017-12-11

## 2017-12-11 MED ORDER — ACETAMINOPHEN 325 MG PO TABS
650.00 | ORAL_TABLET | ORAL | Status: DC
Start: ? — End: 2017-12-11

## 2017-12-11 MED ORDER — SODIUM CHLORIDE 0.9 % IV SOLN
10.00 | INTRAVENOUS | Status: DC
Start: ? — End: 2017-12-11

## 2017-12-11 MED ORDER — VENLAFAXINE HCL ER 75 MG PO CP24
225.00 | ORAL_CAPSULE | ORAL | Status: DC
Start: 2017-12-10 — End: 2017-12-11

## 2017-12-11 MED ORDER — LABETALOL HCL 5 MG/ML IV SOLN
10.00 | INTRAVENOUS | Status: DC
Start: ? — End: 2017-12-11

## 2017-12-11 MED ORDER — VITAMIN D3 25 MCG (1000 UT) PO TABS
1000.00 | ORAL_TABLET | ORAL | Status: DC
Start: 2017-12-10 — End: 2017-12-11

## 2017-12-11 MED ORDER — ONDANSETRON HCL 4 MG/2ML IJ SOLN
4.00 | INTRAMUSCULAR | Status: DC
Start: ? — End: 2017-12-11

## 2017-12-11 MED ORDER — GENERIC EXTERNAL MEDICATION
Status: DC
Start: ? — End: 2017-12-11

## 2017-12-11 MED ORDER — FAMOTIDINE 20 MG PO TABS
40.00 | ORAL_TABLET | ORAL | Status: DC
Start: 2017-12-10 — End: 2017-12-11

## 2017-12-11 MED ORDER — DOCUSATE SODIUM 100 MG PO CAPS
100.00 | ORAL_CAPSULE | ORAL | Status: DC
Start: 2017-12-09 — End: 2017-12-11

## 2017-12-11 MED ORDER — FLUTICASONE PROPIONATE 50 MCG/ACT NA SUSP
1.00 | NASAL | Status: DC
Start: 2017-12-10 — End: 2017-12-11

## 2017-12-11 MED ORDER — HYDRALAZINE HCL 20 MG/ML IJ SOLN
10.00 | INTRAMUSCULAR | Status: DC
Start: ? — End: 2017-12-11

## 2017-12-11 MED ORDER — CIPROFLOXACIN HCL 0.3 % OP SOLN
1.00 | OPHTHALMIC | Status: DC
Start: 2017-12-09 — End: 2017-12-11

## 2017-12-11 MED ORDER — PREGABALIN 75 MG PO CAPS
75.00 | ORAL_CAPSULE | ORAL | Status: DC
Start: 2017-12-09 — End: 2017-12-11

## 2017-12-11 MED ORDER — PANTOPRAZOLE SODIUM 20 MG PO TBEC
20.00 | DELAYED_RELEASE_TABLET | ORAL | Status: DC
Start: 2017-12-09 — End: 2017-12-11

## 2017-12-11 MED ORDER — CLONAZEPAM 1 MG PO TABS
1.00 | ORAL_TABLET | ORAL | Status: DC
Start: ? — End: 2017-12-11

## 2017-12-11 MED ORDER — HYDROCODONE-ACETAMINOPHEN 10-325 MG PO TABS
1.00 | ORAL_TABLET | ORAL | Status: DC
Start: ? — End: 2017-12-11

## 2017-12-11 MED ORDER — ATORVASTATIN CALCIUM 40 MG PO TABS
40.00 | ORAL_TABLET | ORAL | Status: DC
Start: 2017-12-09 — End: 2017-12-11

## 2017-12-11 MED ORDER — PAROXETINE HCL 20 MG PO TABS
40.00 | ORAL_TABLET | ORAL | Status: DC
Start: 2017-12-10 — End: 2017-12-11

## 2017-12-11 MED ORDER — LAMOTRIGINE 100 MG PO TABS
100.00 | ORAL_TABLET | ORAL | Status: DC
Start: 2017-12-09 — End: 2017-12-11

## 2017-12-11 MED ORDER — TRAZODONE HCL 100 MG PO TABS
100.00 | ORAL_TABLET | ORAL | Status: DC
Start: 2017-12-09 — End: 2017-12-11

## 2017-12-11 MED ORDER — AMOXICILLIN-POT CLAVULANATE 875-125 MG PO TABS
1.00 | ORAL_TABLET | ORAL | Status: DC
Start: 2017-12-09 — End: 2017-12-11

## 2017-12-13 DIAGNOSIS — I69322 Dysarthria following cerebral infarction: Secondary | ICD-10-CM | POA: Diagnosis not present

## 2017-12-13 DIAGNOSIS — I69354 Hemiplegia and hemiparesis following cerebral infarction affecting left non-dominant side: Secondary | ICD-10-CM | POA: Diagnosis not present

## 2017-12-14 DIAGNOSIS — F319 Bipolar disorder, unspecified: Secondary | ICD-10-CM | POA: Diagnosis not present

## 2017-12-14 DIAGNOSIS — G459 Transient cerebral ischemic attack, unspecified: Secondary | ICD-10-CM | POA: Diagnosis not present

## 2017-12-14 DIAGNOSIS — G40009 Localization-related (focal) (partial) idiopathic epilepsy and epileptic syndromes with seizures of localized onset, not intractable, without status epilepticus: Secondary | ICD-10-CM | POA: Diagnosis not present

## 2017-12-14 DIAGNOSIS — R296 Repeated falls: Secondary | ICD-10-CM | POA: Diagnosis not present

## 2017-12-14 DIAGNOSIS — I69354 Hemiplegia and hemiparesis following cerebral infarction affecting left non-dominant side: Secondary | ICD-10-CM | POA: Diagnosis not present

## 2017-12-14 DIAGNOSIS — G894 Chronic pain syndrome: Secondary | ICD-10-CM | POA: Diagnosis not present

## 2017-12-14 DIAGNOSIS — Z09 Encounter for follow-up examination after completed treatment for conditions other than malignant neoplasm: Secondary | ICD-10-CM | POA: Diagnosis not present

## 2017-12-14 DIAGNOSIS — I69322 Dysarthria following cerebral infarction: Secondary | ICD-10-CM | POA: Diagnosis not present

## 2017-12-14 DIAGNOSIS — F17210 Nicotine dependence, cigarettes, uncomplicated: Secondary | ICD-10-CM | POA: Diagnosis not present

## 2017-12-14 DIAGNOSIS — R42 Dizziness and giddiness: Secondary | ICD-10-CM | POA: Diagnosis not present

## 2017-12-14 DIAGNOSIS — R270 Ataxia, unspecified: Secondary | ICD-10-CM | POA: Diagnosis not present

## 2017-12-15 DIAGNOSIS — I69322 Dysarthria following cerebral infarction: Secondary | ICD-10-CM | POA: Diagnosis not present

## 2017-12-15 DIAGNOSIS — I69354 Hemiplegia and hemiparesis following cerebral infarction affecting left non-dominant side: Secondary | ICD-10-CM | POA: Diagnosis not present

## 2017-12-20 ENCOUNTER — Other Ambulatory Visit: Payer: Self-pay

## 2017-12-20 ENCOUNTER — Encounter: Payer: Self-pay | Admitting: Neurology

## 2017-12-20 ENCOUNTER — Ambulatory Visit (INDEPENDENT_AMBULATORY_CARE_PROVIDER_SITE_OTHER): Payer: Medicare Other | Admitting: Neurology

## 2017-12-20 VITALS — BP 122/78 | HR 99 | Ht 69.0 in | Wt 234.0 lb

## 2017-12-20 DIAGNOSIS — I69322 Dysarthria following cerebral infarction: Secondary | ICD-10-CM | POA: Diagnosis not present

## 2017-12-20 DIAGNOSIS — G40009 Localization-related (focal) (partial) idiopathic epilepsy and epileptic syndromes with seizures of localized onset, not intractable, without status epilepticus: Secondary | ICD-10-CM | POA: Diagnosis not present

## 2017-12-20 DIAGNOSIS — I69354 Hemiplegia and hemiparesis following cerebral infarction affecting left non-dominant side: Secondary | ICD-10-CM | POA: Diagnosis not present

## 2017-12-20 DIAGNOSIS — F4310 Post-traumatic stress disorder, unspecified: Secondary | ICD-10-CM | POA: Diagnosis not present

## 2017-12-20 DIAGNOSIS — F3130 Bipolar disorder, current episode depressed, mild or moderate severity, unspecified: Secondary | ICD-10-CM | POA: Diagnosis not present

## 2017-12-20 MED ORDER — LAMOTRIGINE 100 MG PO TABS: 100.0000 mg | ORAL_TABLET | Freq: Two times a day (BID) | ORAL | 3 refills | Status: DC

## 2017-12-20 NOTE — Progress Notes (Signed)
NEUROLOGY FOLLOW UP OFFICE NOTE  Eulon Allnutt [id number]  DOB: 1961/05/03  HISTORY OF PRESENT ILLNESS: I had the pleasure of seeing Daniil Labarge in follow-up in the neurology clinic on 12/20/2017.  He is again accompanied by his wife who helps supplement the history today. The patient was last seen 6 months ago for recurrent episodes concerning for seizures with episodes of unresponsiveness with falls. He had a 48-hour EEG done which showed occasional focal slowing over the left temporal region, no epileptiform discharges seen, typical events not captured. He was reporting a seizure every 1-2 weeks, his wife would come home and find a goose egg on his head. He is taking Lamotrigine 100mg  BID without any episodes since August 2018. He was in the hospital overnight at [facility] from 5/10 to 5/11 for left-sided weakness, dropping things from his left hand, slurred speech, and gibberish speech. He had a left-sideed facial droop per wife. He had an MRI brain with no acute changes seen. Echo and carotid dopplers were normal. There was concern about polypharmacy raised. He and his wife report that he is doing much better since then. He has home PT. He has had 2 falls last week, one time getting out of the bed too quickly, another while getting into the tub and hitting the back of his head. No loss of consciousness. He lost his balance yesterday but did not fall. He is on multiple psychiatry medications, his wife reports Paxil was recently added for anxiety and Tramadol for sleep. She reduced dose to 50mg  qhs, which does not help as much for sleep, he has difficulty with sleep maintenance.  HPI 03/03/2016: This is a pleasant 57 yo RH man with a history of COPD, chronic neck and back pain, PTSD, who presented for seizures. He reports that the first seizure occurred 3-4 years ago, he woke up on the floor then 2-3 hours later passed out again. He denied any prior warning symptoms. He was started on an  unrecalled seizure medication which he took for a year then self-discontinued because he had been seizure-free.  He was event-free until January 2017 when he had 3 episodes in a week. The first episode he was sitting on the couch when his head went back and he became unresponsive for a few seconds, eyes were closed. The next day, he was working on something on the wall when his uncle noticed he looked lost. He called to him and he snapped back. The third episode occurred 2 days later, again while sitting on the couch, his head went back with eyes closed, then he had mild jerking of both arms for a few seconds. No tongue bite or incontinence. When he came to, he told his fiancee he had a headache and felt nauseated. He had another mild episode 3 weeks ago. His is amnestic of these episodes, his whole body feels weak after, no focal weakness. He and his fiancee deny any staring/unresponsive episodes, no gaps in time. He denies any  olfactory/gustatory hallucinations, deja vu, rising epigastric sensation, myoclonic jerks. When he turns his head to the right, he has numbness over the left arm. He reports having degenerative disc disease and sees neurosurgery. He was started on Keppra 500mg  and denies any side effects since taking it the past 3 days. He was in the ER last 02/25/16 for gait difficulty, he was staggering, walking like was drunk. He was evaluated by Neurology at [facility], no focal weakness seen, finger to nose testing appeared functional. Symptoms  lasted 2-3 days. He had not started Keppra when these symptoms began. He had an MRI brain with and without contrast which I personally reviewed, there were no acute changes, hippocampi symmetric with no abnormal signal or enhancement seen. He had a head CT the next day with no acute changes. The gait instability has since resolved.  He has been seeing a therapist and psychiatrist for bipolar disorder and been told the seizures may be psychogenic from his PTSD, which  he feels may be the case. He has had several head injuries with loss of consciousness while he was in [employer], otherwise he had a normal birth and early development.  There is no history of febrile convulsions, CNS infections such as meningitis/encephalitis, neurosurgical procedures, or family history of seizures.  He was also reporting memory loss and underwent Neuropsychological evaluation with a diagnosis of Bipolar disorder, current episode depressed; PTSD (by history). Per report, "Results of cognitive testing could not be interpreted due to evidence of sub-optimal effort on formal testing. A finding of sub-optimal effort in combination with severely impaired performances across all aspects of testing is commonly seen in individuals with somatoform disorders such as PNES. Additionally, he presents with significant emotional distress and relatively severe psychopathology including bipolar disorder (currently depressed with suicidal ideation) and PTSD; primary psychiatric disorders such as these are often present in individuals with PNES as well. Regardless of whether or not he has PNES, it is clear that his psychiatric disorders are significantly and negatively affecting his daily functioning and likely his cognitive functioning to at least some degree. Ongoing and intensive mental health treatment is certainly indicated, including ongoing risk assessment and crisis intervention as indicated." He continues to see a psychiatrist and a therapist every month.  PAST MEDICAL HISTORY: Past Medical History:  Diagnosis Date  . Allergic rhinitis   . Arthritis    shoulders, back  . Asthma   . Bipolar disorder ([place])   . Chickenpox   . COPD (chronic obstructive pulmonary disease) ([place])   . Depression   . Low back pain   . Seizures ([place])    last one approx 18 mos ago.  No meds approx 1 yr.  . Sleep apnea    supposed to use CPAP. Does not have.  . Stiff neck    limited side to side turn.  Left side pain  when looking up.  . Throat cancer [facility])     MEDICATIONS:  Outpatient Encounter Medications as of 12/20/2017  Medication Sig Note  . albuterol (PROVENTIL HFA;VENTOLIN HFA) 108 (90 Base) MCG/ACT inhaler Inhale 2 puffs every 6 (six) hours as needed into the lungs for wheezing or shortness of breath.   Marland Kitchen amoxicillin-clavulanate (AUGMENTIN) 875-125 MG tablet Take 1 tablet 2 (two) times daily by mouth.   Marland Kitchen aspirin 81 MG EC tablet Take by mouth.   Marland Kitchen atorvastatin (LIPITOR) 20 MG tablet Take by mouth.   . benztropine (COGENTIN) 1 MG tablet Take by mouth.   . cyclobenzaprine (FLEXERIL) 10 MG tablet Take 1 tablet (10 mg total) by mouth 3 (three) times daily.   Marland Kitchen lamoTRIgine (LAMICTAL) 100 MG tablet Take 1 tablet (100 mg total) by mouth 2 (two) times daily.   . Lurasidone HCl 60 MG TABS Take 80 mg by mouth at bedtime.    . pregabalin (LYRICA) 75 MG capsule Take 1 capsule (75 mg total) by mouth 2 (two) times daily. 03/06/2017: Still taking.   . [DISCONTINUED] lamoTRIgine (LAMICTAL) 100 MG tablet Take 1 tablet (  100 mg total) by mouth 2 (two) times daily.   . [DISCONTINUED] budesonide-formoterol (SYMBICORT) 80-4.5 MCG/ACT inhaler Inhale 2 puffs 2 (two) times daily into the lungs.   . [DISCONTINUED] cholecalciferol (VITAMIN D) 400 units TABS tablet Take 1,200 Units by mouth.   . [DISCONTINUED] clonazePAM (KLONOPIN) 1 MG tablet Take 1 mg 3 (three) times daily by mouth.    . [DISCONTINUED] fluticasone (FLONASE) 50 MCG/ACT nasal spray Place 2 sprays into both nostrils daily.   . [DISCONTINUED] ipratropium-albuterol (DUONEB) 0.5-2.5 (3) MG/3ML SOLN Take 3 mLs by nebulization 2 (two) times daily.   . [DISCONTINUED] pantoprazole (PROTONIX) 40 MG tablet Take 1 tablet (40 mg total) by mouth 2 (two) times daily before a meal.   . [DISCONTINUED] ranitidine (ZANTAC) 300 MG tablet Take 300 mg by mouth at bedtime.   . [DISCONTINUED] sildenafil (VIAGRA) 100 MG tablet Take 100 mg by mouth daily as needed for erectile  dysfunction.    No facility-administered encounter medications on file as of 12/20/2017.     ALLERGIES: No Known Allergies  FAMILY HISTORY: Family History  Problem Relation Age of Onset  . Alcoholism Unknown   . Arthritis Unknown   . Hyperlipidemia Unknown   . Heart disease Unknown   . Sudden death Unknown   . Kidney disease Unknown   . Mental illness Unknown   . Depression Mother   . Bipolar disorder Mother   . Depression Brother   . Bipolar disorder Brother   . Heart disease Brother   . Heart disease Brother     SOCIAL HISTORY: Social History   Socioeconomic History  . Marital status: Single    Spouse name: Not on file  . Number of children: Not on file  . Years of education: Not on file  . Highest education level: Not on file  Occupational History  . Occupation: retired    Comment: Wellsite geologist  . Financial resource strain: Not on file  . Food insecurity:    Worry: Not on file    Inability: Not on file  . Transportation needs:    Medical: Not on file    Non-medical: Not on file  Tobacco Use  . Smoking status: Former Smoker    Packs/day: 1.00    Years: 35.00    Pack years: 35.00    Types: Cigarettes    Last attempt to quit: 05/01/2014    Years since quitting: 3.6  . Smokeless tobacco: Never Used  Substance and Sexual Activity  . Alcohol use: No    Alcohol/week: 0.0 oz  . Drug use: No  . Sexual activity: Not on file  Lifestyle  . Physical activity:    Days per week: Not on file    Minutes per session: Not on file  . Stress: Not on file  Relationships  . Social connections:    Talks on phone: Not on file    Gets together: Not on file    Attends religious service: Not on file    Active member of club or organization: Not on file    Attends meetings of clubs or organizations: Not on file    Relationship status: Not on file  . Intimate partner violence:    Fear of current or ex partner: Not on file    Emotionally abused: Not on file     Physically abused: Not on file    Forced sexual activity: Not on file  Other Topics Concern  . Not on file  Social History Narrative  .  Not on file    REVIEW OF SYSTEMS: Constitutional: No fevers, chills, or sweats, no generalized fatigue, change in appetite Eyes: No visual changes, double vision, eye pain Ear, nose and throat: No hearing loss, ear pain, nasal congestion, sore throat Cardiovascular: No chest pain, palpitations Respiratory:  No shortness of breath at rest or with exertion, wheezes GastrointestinaI: No nausea, vomiting, diarrhea, abdominal pain, fecal incontinence Genitourinary:  No dysuria, urinary retention or frequency Musculoskeletal:  + neck pain, back pain Integumentary: No rash, pruritus, skin lesions Neurological: as above Psychiatric: + depression, insomnia, anxiety Endocrine: No palpitations, fatigue, diaphoresis, mood swings, change in appetite, change in weight, increased thirst Hematologic/Lymphatic:  No anemia, purpura, petechiae. Allergic/Immunologic: no itchy/runny eyes, nasal congestion, recent allergic reactions, rashes  PHYSICAL EXAM: Vitals:   12/20/17 1514  BP: 122/78  Pulse: 99  SpO2: 97%   General: No acute distress, in good spirits today Head:  Normocephalic/atraumatic Neck: supple, no paraspinal tenderness, full range of motion Heart:  Regular rate and rhythm Lungs:  Clear to auscultation bilaterally Back: No paraspinal tenderness Skin/Extremities: No rash, no edema Neurological Exam: alert and oriented to person, place, and month/year, day of week. No aphasia or dysarthria. Fund of knowledge is appropriate.  Remote memory intact. Attention and concentration are normal.    Able to name objects and repeat phrases. Cranial nerves: Pupils equal, round, reactive to light. Extraocular movements intact with no nystagmus. Visual fields full. Facial sensation intact. No facial asymmetry. Tongue, uvula, palate midline.  Motor: Bulk and tone normal,  muscle strength 5/5 throughout with no pronator drift.  Sensation to light touch intact.  No extinction to double simultaneous stimulation.  Deep tendon reflexes 2+ throughout, toes downgoing.  Finger to nose testing intact.  Gait narrow-based and steady, able to tandem walk adequately.  Romberg negative.  IMPRESSION: This is a pleasant 57 yo RH man with a history of COPD, chronic neck and back pain, PTSD, with a history of seizures since 2013/2014. His EEG showed occasional left temporal slowing, no epileptiform discharges. MRI brain unremarkable. He and his wife deny any seizures since August 2018 on Lamotrigine 100mg  BID. He was in the hospital 2 weeks ago for slurred speech and left-sided weakness, MRI brain normal. Etiology unclear, possibly due to polypharmacy. He is on multiple psychoactive medications, continue follow-up with Psychiatry and therapy. His wife continues to note cognitive changes, we again discussed prior Neuropsych testing indicated sub-optimal effort on formal testing,commonly seen in individuals with somatoform disorders such as PNES. He is aware of [place] driving laws to stop driving after a seizure, until 6 months seizure-free. Continue home PT. He will follow-up in 6 months and knows to call for any changes.   Thank you for allowing me to participate in his care.  Please do not hesitate to call for any questions or concerns.  The duration of this appointment visit was 25 minutes of face-to-face time with the patient.  Greater than 50% of this time was spent in counseling, explanation of diagnosis, planning of further management, and coordination of care.   Ellouise Newer, M.D.   CC: Dr. Caryl Bis

## 2017-12-20 NOTE — Patient Instructions (Signed)
1. Continue Lamictal 100mg  twice a day  2. Continue follow-up with Psychiatry and bring all your medications so they can review and adjust as needed 3. Continue with home PT 4. Follow-up in 6 months, call for any changes

## 2017-12-21 DIAGNOSIS — I69322 Dysarthria following cerebral infarction: Secondary | ICD-10-CM | POA: Diagnosis not present

## 2017-12-21 DIAGNOSIS — I69354 Hemiplegia and hemiparesis following cerebral infarction affecting left non-dominant side: Secondary | ICD-10-CM | POA: Diagnosis not present

## 2017-12-22 DIAGNOSIS — I69322 Dysarthria following cerebral infarction: Secondary | ICD-10-CM | POA: Diagnosis not present

## 2017-12-22 DIAGNOSIS — I69354 Hemiplegia and hemiparesis following cerebral infarction affecting left non-dominant side: Secondary | ICD-10-CM | POA: Diagnosis not present

## 2017-12-26 DIAGNOSIS — I69354 Hemiplegia and hemiparesis following cerebral infarction affecting left non-dominant side: Secondary | ICD-10-CM | POA: Diagnosis not present

## 2017-12-26 DIAGNOSIS — I69322 Dysarthria following cerebral infarction: Secondary | ICD-10-CM | POA: Diagnosis not present

## 2017-12-28 DIAGNOSIS — I69322 Dysarthria following cerebral infarction: Secondary | ICD-10-CM | POA: Diagnosis not present

## 2017-12-28 DIAGNOSIS — I69354 Hemiplegia and hemiparesis following cerebral infarction affecting left non-dominant side: Secondary | ICD-10-CM | POA: Diagnosis not present

## 2018-01-01 DIAGNOSIS — I69322 Dysarthria following cerebral infarction: Secondary | ICD-10-CM | POA: Diagnosis not present

## 2018-01-01 DIAGNOSIS — I69354 Hemiplegia and hemiparesis following cerebral infarction affecting left non-dominant side: Secondary | ICD-10-CM | POA: Diagnosis not present

## 2018-01-02 DIAGNOSIS — I69354 Hemiplegia and hemiparesis following cerebral infarction affecting left non-dominant side: Secondary | ICD-10-CM | POA: Diagnosis not present

## 2018-01-02 DIAGNOSIS — I69322 Dysarthria following cerebral infarction: Secondary | ICD-10-CM | POA: Diagnosis not present

## 2018-01-04 DIAGNOSIS — I69322 Dysarthria following cerebral infarction: Secondary | ICD-10-CM | POA: Diagnosis not present

## 2018-01-04 DIAGNOSIS — I69354 Hemiplegia and hemiparesis following cerebral infarction affecting left non-dominant side: Secondary | ICD-10-CM | POA: Diagnosis not present

## 2018-01-10 DIAGNOSIS — I69322 Dysarthria following cerebral infarction: Secondary | ICD-10-CM | POA: Diagnosis not present

## 2018-01-10 DIAGNOSIS — I69354 Hemiplegia and hemiparesis following cerebral infarction affecting left non-dominant side: Secondary | ICD-10-CM | POA: Diagnosis not present

## 2018-01-16 DIAGNOSIS — I69322 Dysarthria following cerebral infarction: Secondary | ICD-10-CM | POA: Diagnosis not present

## 2018-01-16 DIAGNOSIS — I69354 Hemiplegia and hemiparesis following cerebral infarction affecting left non-dominant side: Secondary | ICD-10-CM | POA: Diagnosis not present

## 2018-01-26 DIAGNOSIS — I69354 Hemiplegia and hemiparesis following cerebral infarction affecting left non-dominant side: Secondary | ICD-10-CM | POA: Diagnosis not present

## 2018-01-26 DIAGNOSIS — I69322 Dysarthria following cerebral infarction: Secondary | ICD-10-CM | POA: Diagnosis not present

## 2018-01-29 DIAGNOSIS — F3132 Bipolar disorder, current episode depressed, moderate: Secondary | ICD-10-CM | POA: Diagnosis not present

## 2018-01-29 DIAGNOSIS — F5101 Primary insomnia: Secondary | ICD-10-CM | POA: Diagnosis not present

## 2018-01-29 DIAGNOSIS — F4310 Post-traumatic stress disorder, unspecified: Secondary | ICD-10-CM | POA: Diagnosis not present

## 2018-01-29 DIAGNOSIS — F4312 Post-traumatic stress disorder, chronic: Secondary | ICD-10-CM | POA: Diagnosis not present

## 2018-01-29 DIAGNOSIS — F411 Generalized anxiety disorder: Secondary | ICD-10-CM | POA: Diagnosis not present

## 2018-01-30 DIAGNOSIS — I69354 Hemiplegia and hemiparesis following cerebral infarction affecting left non-dominant side: Secondary | ICD-10-CM | POA: Diagnosis not present

## 2018-01-30 DIAGNOSIS — I69322 Dysarthria following cerebral infarction: Secondary | ICD-10-CM | POA: Diagnosis not present

## 2018-02-18 DIAGNOSIS — J069 Acute upper respiratory infection, unspecified: Secondary | ICD-10-CM | POA: Diagnosis not present

## 2018-02-18 DIAGNOSIS — J029 Acute pharyngitis, unspecified: Secondary | ICD-10-CM | POA: Diagnosis not present

## 2018-02-21 IMAGING — MR MR LUMBAR SPINE W/O CM
4 of 5 series · 19 of 48 positions shown · non-contrast
Comparison: None.

CLINICAL DATA: Low back pain. RIGHT leg pain, numbness and
tingling.

EXAM:
MRI LUMBAR SPINE WITHOUT CONTRAST
TECHNIQUE: Multiplanar, multisequence MR imaging of the lumbar spine was
performed. No intravenous contrast was administered.

[Series 3: T1 · sagittal · 4.0mm · 0.51mm/px · 3 of 15 slices shown (1 of 2)]
[im 3/15]
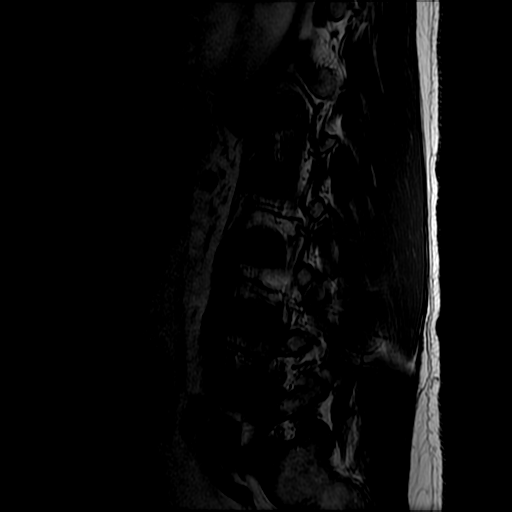
[im 9/15]
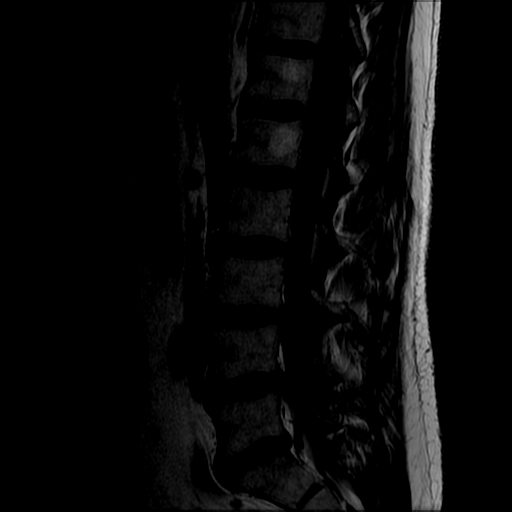
[im 15/15]
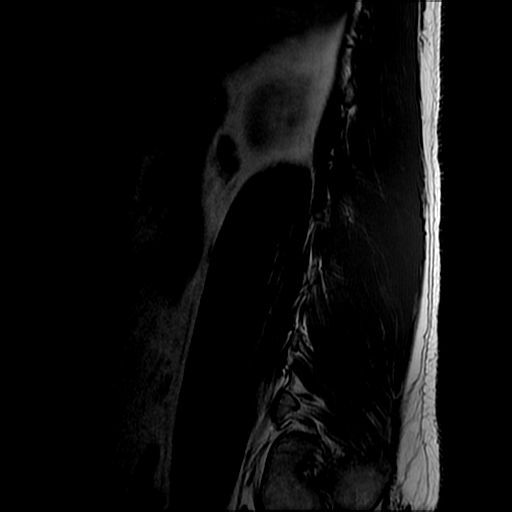

[Series 4: T2 · sagittal · 4.0mm · 0.51mm/px · 6 of 15 slices shown (1 of 2)]
[im 1/15]
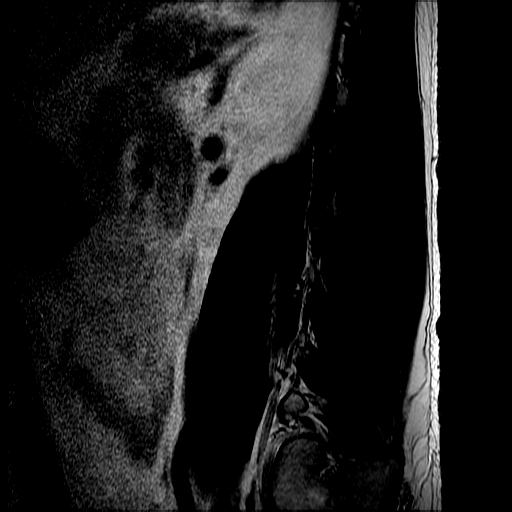
[im 3/15]
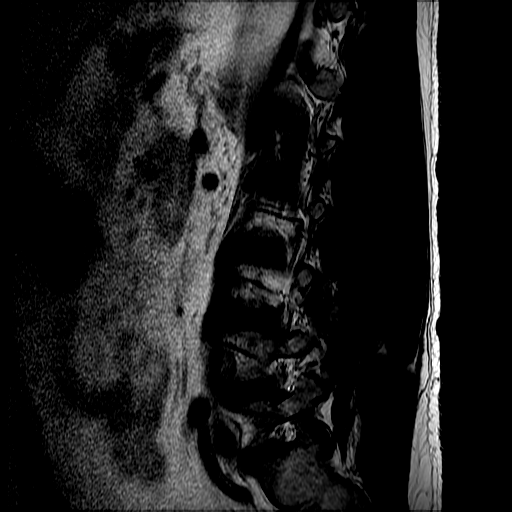
[im 6/15]
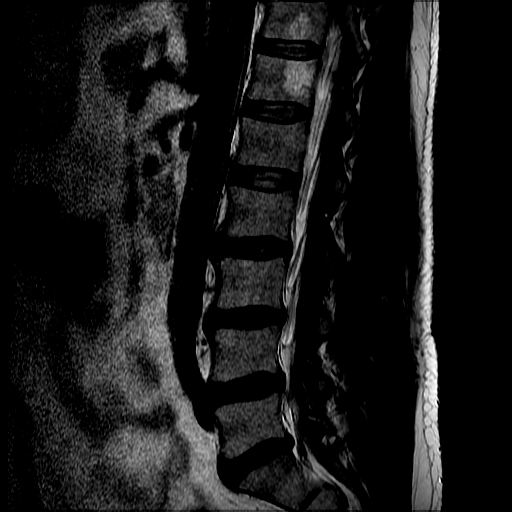
[im 9/15]
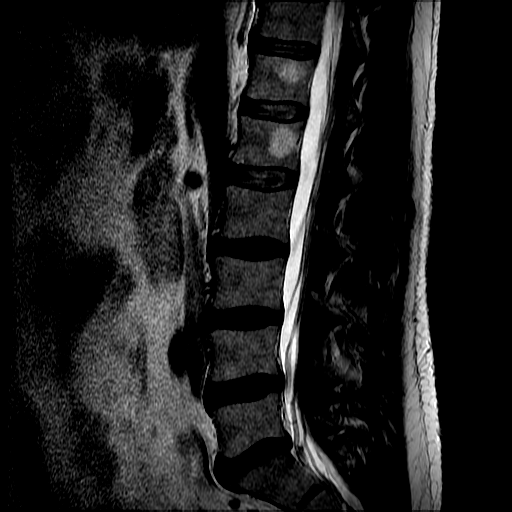
[im 12/15]
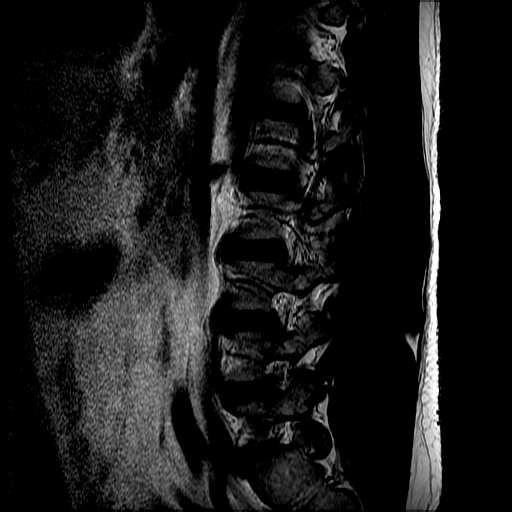
[im 15/15]
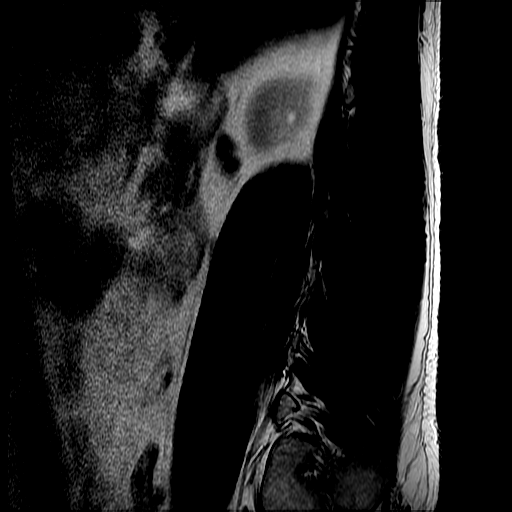

[Series 6: T2 · axial · 4.0mm · 0.39mm/px · z∈[-151,+12]mm · 7 of 36 slices shown (2 of 2)]
[im 1/36]
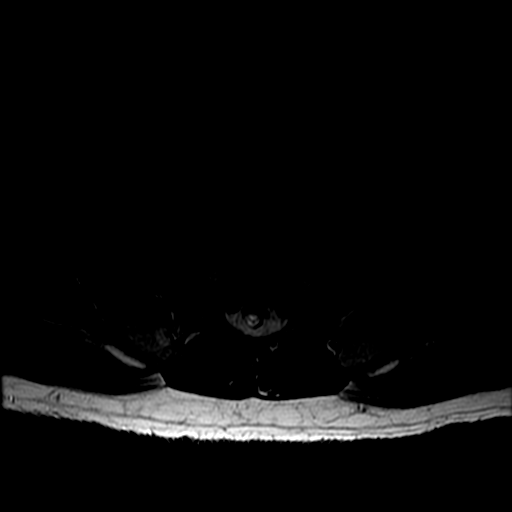
[im 6/36]
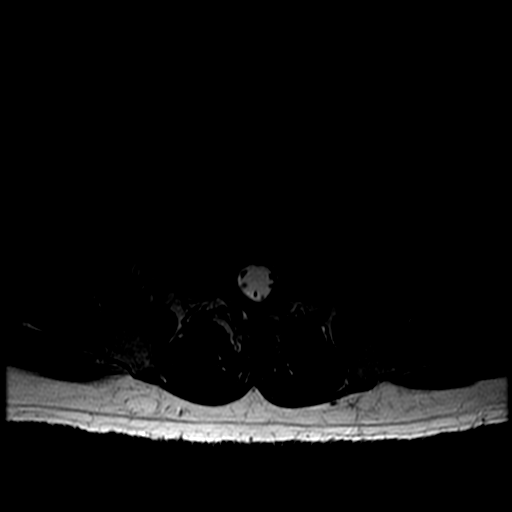
[im 11/36]
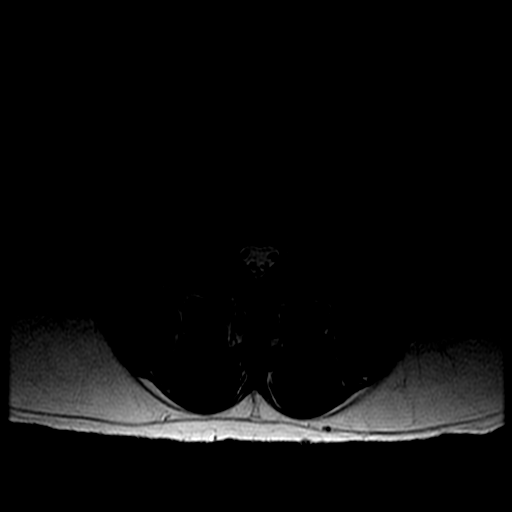
[im 16/36]
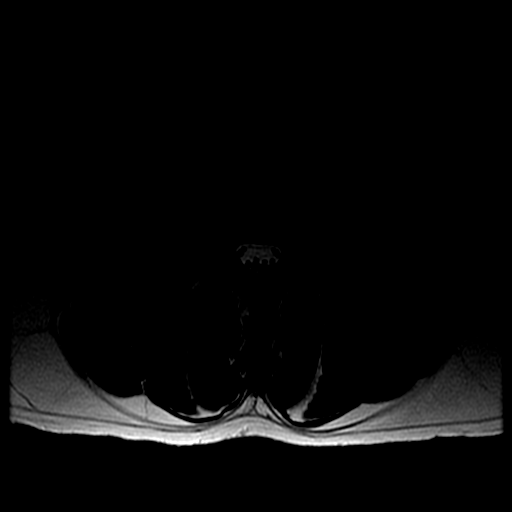
[im 18/36]
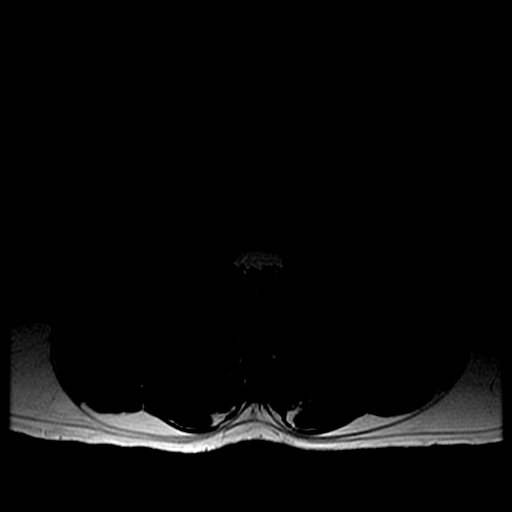
[im 21/36]
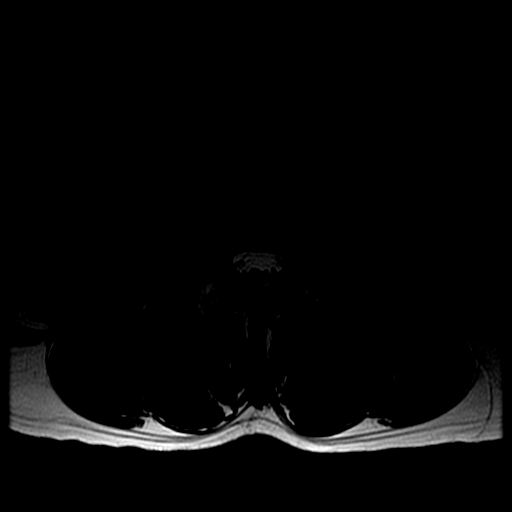
[im 31/36]
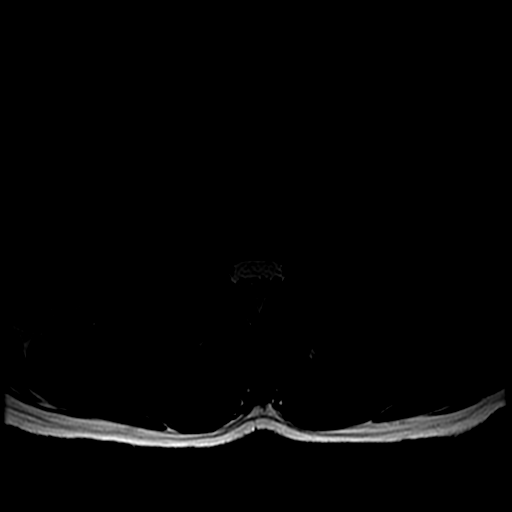

[Series 7: T1 · axial · 4.0mm · 0.39mm/px · z∈[-126,+12]mm · 3 of 36 slices shown (2 of 2)]
[im 6/36]
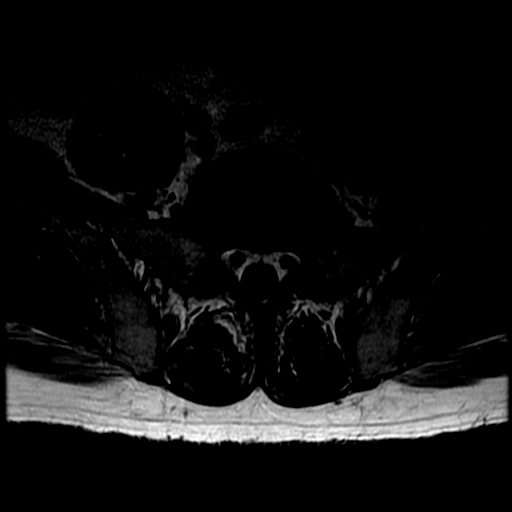
[im 18/36]
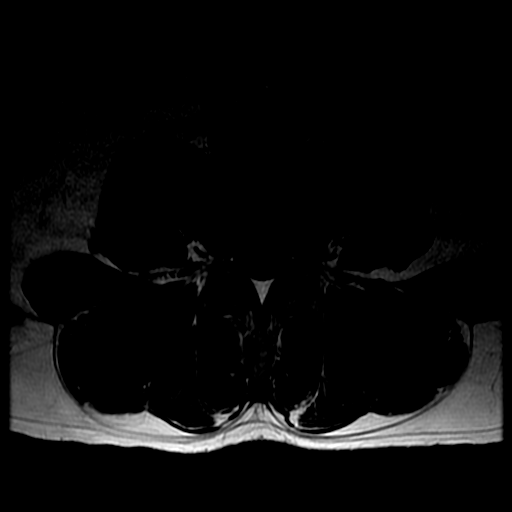
[im 31/36]
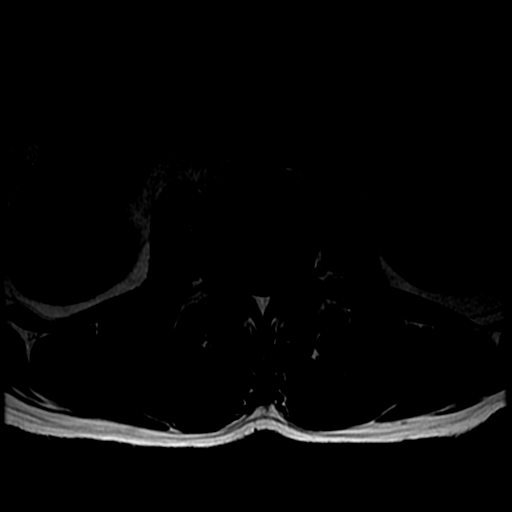

[19 of 48 positions shown; findings below may reference images not displayed]

FINDINGS: Segmentation:  Standard.

Alignment:  Physiologic.

Vertebrae:  No fracture, evidence of discitis, or bone lesion.

Conus medullaris: Extends to the L1 level and appears normal.

Paraspinal and other soft tissues: Incompletely evaluated renal
cystic disease. No mass or hydronephrosis.

Disc levels:

L1-L2:  Normal

L2-L3:  Normal

L3-L4:  Normal

L4-L5: Central annular bulge. Mild facet arthropathy. Annular tears
are seen in both foramina, with subligamentous protrusions on the
RIGHT and LEFT. In conjunction with facet hypertrophy, there is
foraminal narrowing bilaterally which could affect either the RIGHT
or LEFT L4 nerve root.

L5-S1: Annular bulge. Mild facet arthropathy. No significant
subarticular zone or foraminal zone narrowing.
IMPRESSION: Potentially symptomatic annular tears with subligamentous
protrusions at L4-5 bilaterally. Either L4 nerve root could be
irritated. Correlate clinically as a cause of RIGHT leg pain.

## 2018-02-21 IMAGING — MR MR HEAD WO/W CM
10 of 13 series · 34 of 48 positions shown · IV contrast (multihance)
Comparison: MRI lumbar spine reported separately.

CLINICAL DATA: Recent history of seizures. Previous head injury.
Intermittent RIGHT facial numbness.

EXAM:
MRI HEAD WITHOUT AND WITH CONTRAST
TECHNIQUE: Multiplanar, multiecho pulse sequences of the brain and surrounding
structures were obtained without and with intravenous contrast.
CONTRAST:  MultiHance 18 mL.

[Series 4: T1 · sagittal · 5.0mm · 0.47mm/px · 1 of 25 slices shown]
[im 1/25]
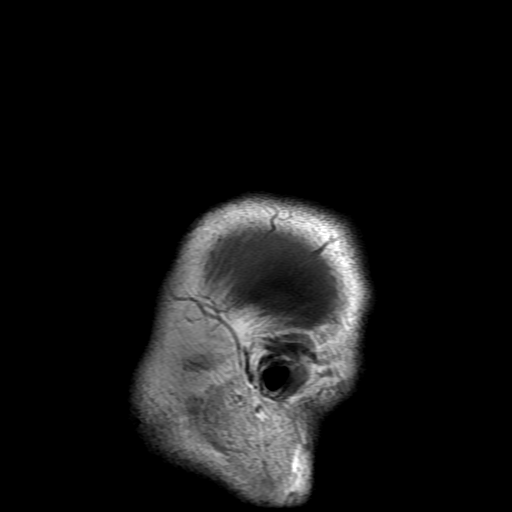

[Series 5: DWI · axial · 3.0mm · 1.09mm/px · z∈[-79,+89]mm · 8 of 114 slices shown (1 of 4)]
[im 1/114]
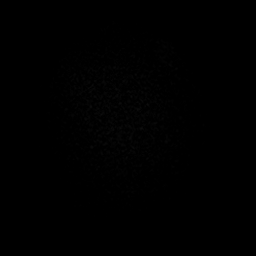
[im 13/114]
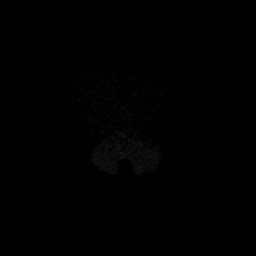
[im 38/114]
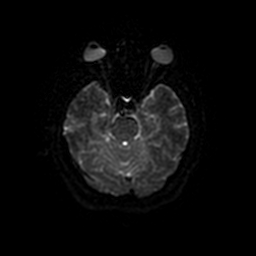
[im 51/114]
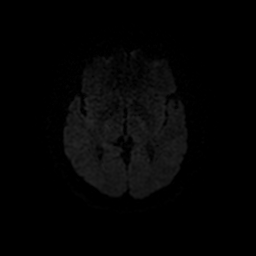
[im 63/114]
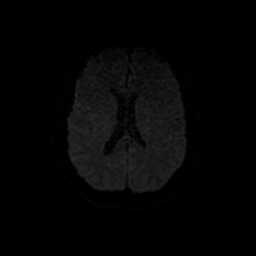
[im 76/114]
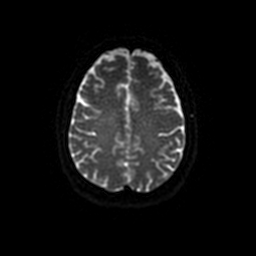
[im 101/114]
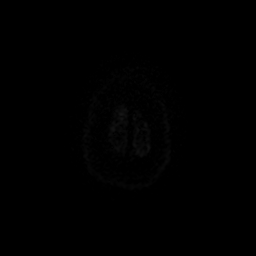
[im 114/114]
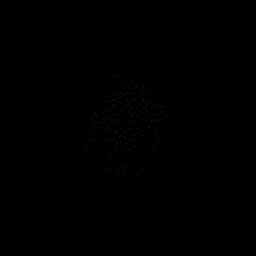

[Series 6: DWI · coronal · 5.0mm · 1.09mm/px · 6 of 71 slices shown (2 of 4)]
[im 1/71]
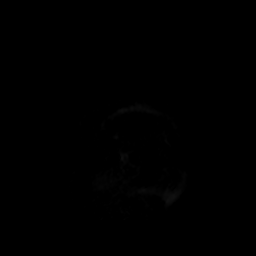
[im 15/71]
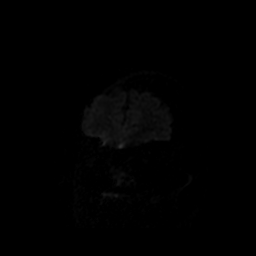
[im 29/71]
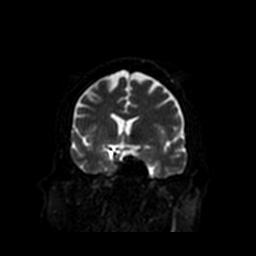
[im 43/71]
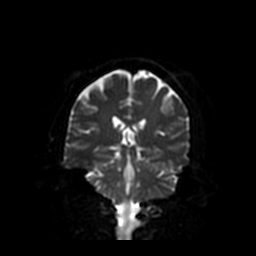
[im 57/71]
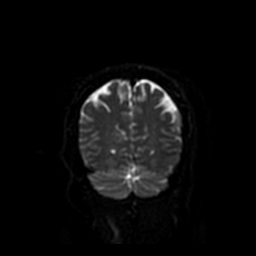
[im 71/71]
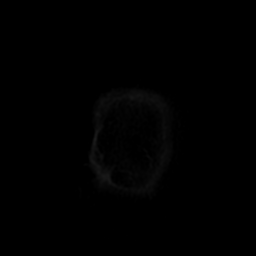

[Series 7: T2 · coronal · 3.0mm · 0.39mm/px · 3 of 39 slices shown (1 of 2)]
[im 1/39]
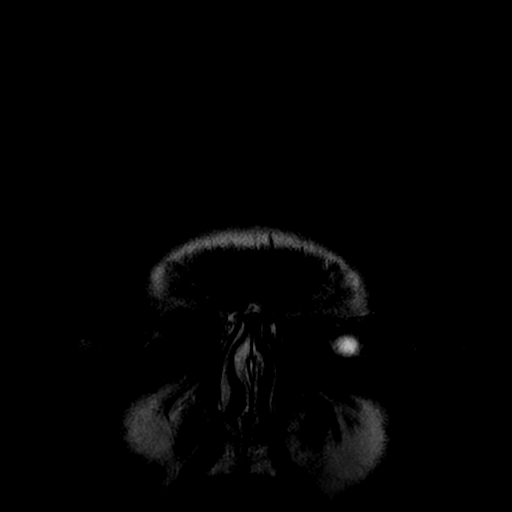
[im 20/39]
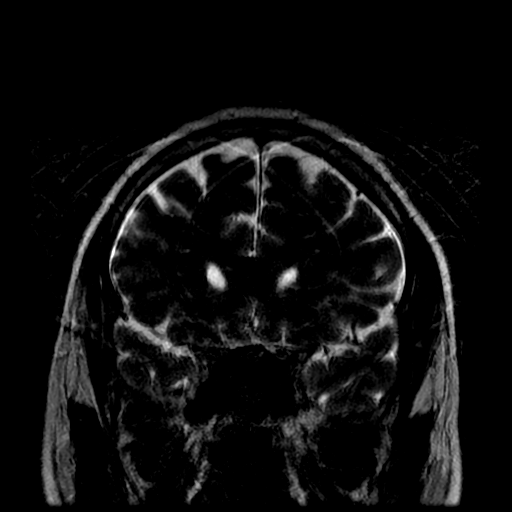
[im 39/39]
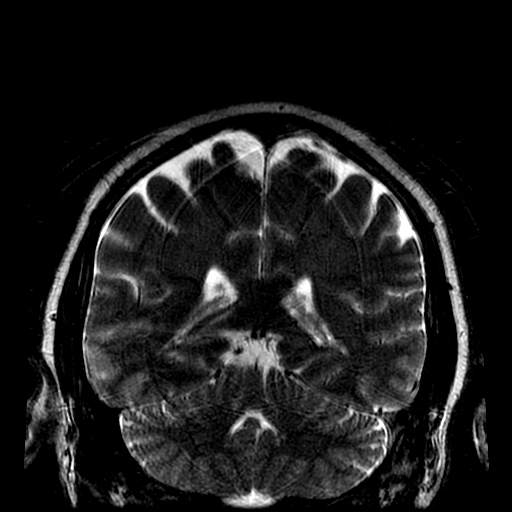

[Series 8: T2 · axial · 5.0mm · 0.43mm/px · z∈[-78,+91]mm · 2 of 27 slices shown (2 of 2)]
[im 1/27]
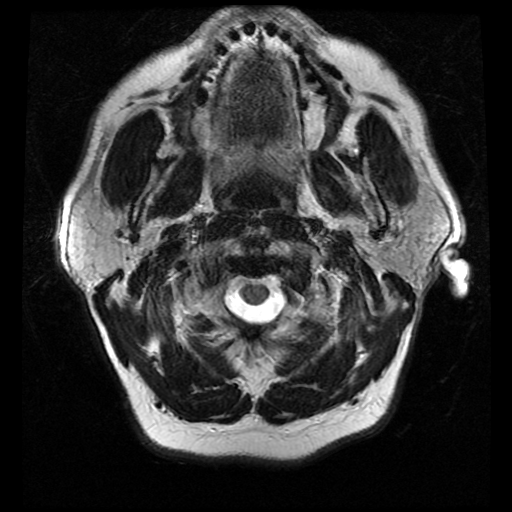
[im 27/27]
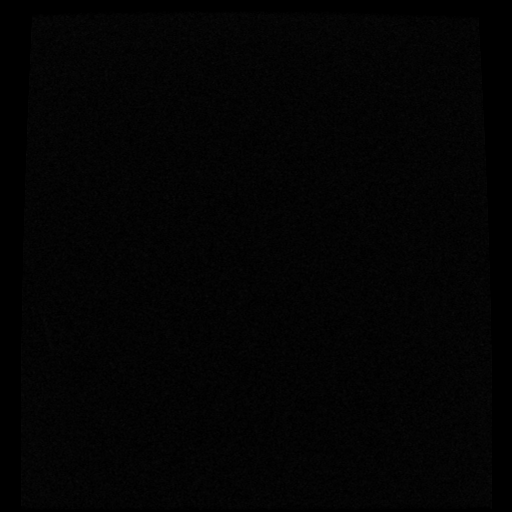

[Series 9: FLAIR · axial · 5.0mm · 0.43mm/px · z∈[-77,+91]mm · 2 of 25 slices shown]
[im 1/25]
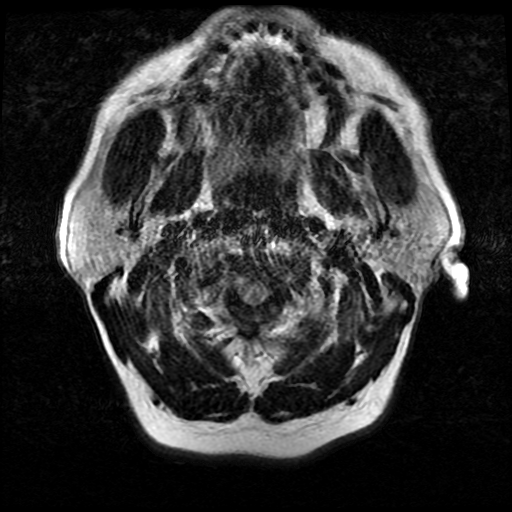
[im 25/25]
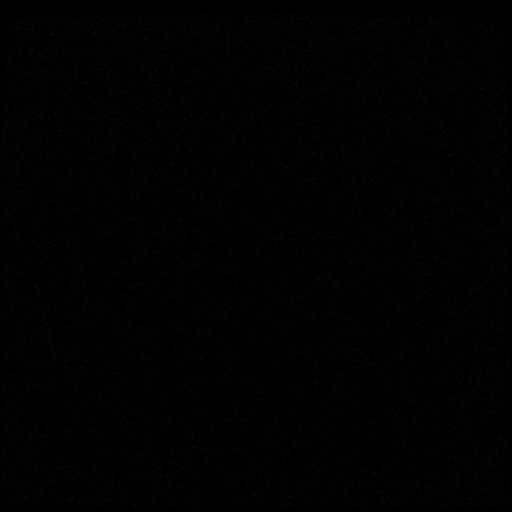

[Series 12: T2 post-contrast · coronal · 5.0mm · 0.45mm/px · 2 of 29 slices shown]
[im 1/29]
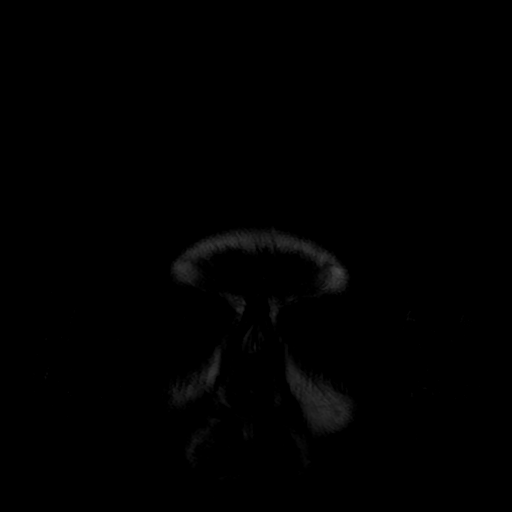
[im 29/29]
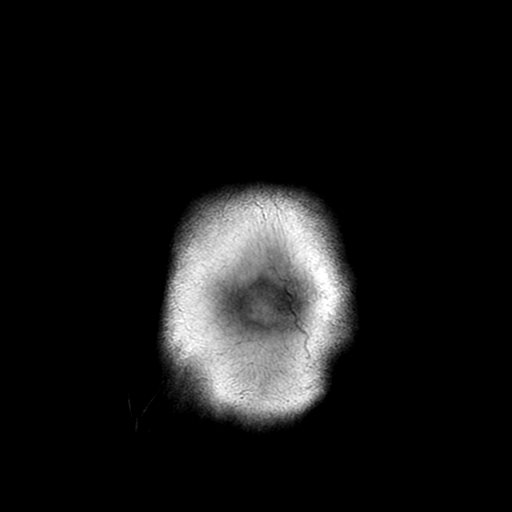

[Series 14: T1 post-contrast · coronal · 5.0mm · 0.45mm/px · 2 of 29 slices shown]
[im 1/29]
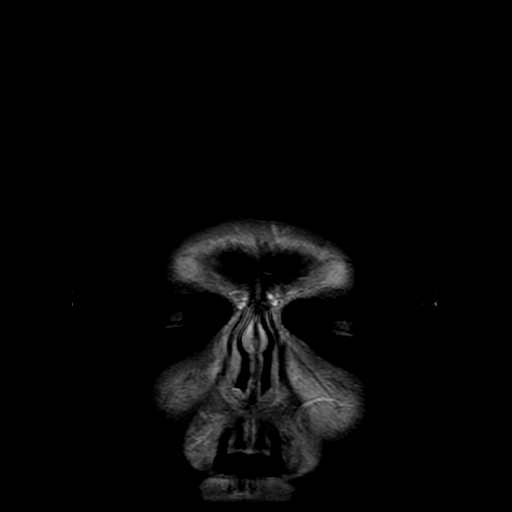
[im 29/29]
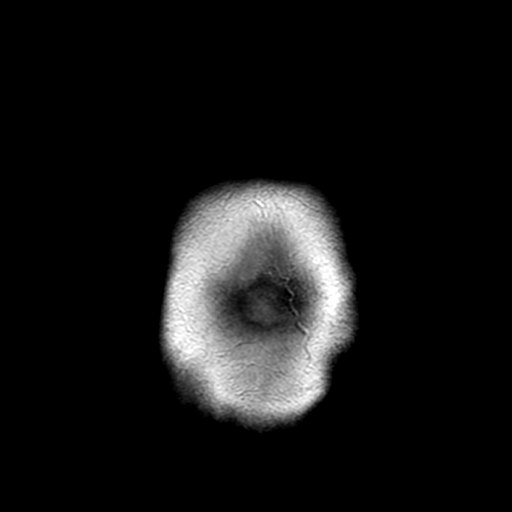

[Series 500: DWI · axial · 3.0mm · 1.09mm/px · z∈[-79,+89]mm · 5 of 57 slices shown (3 of 4)]
[im 1/57]
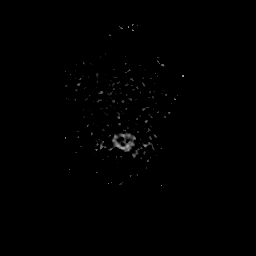
[im 15/57]
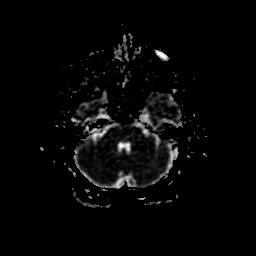
[im 29/57]
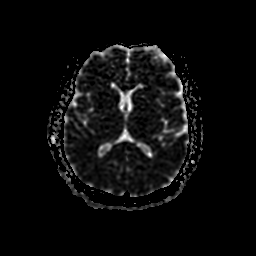
[im 43/57]
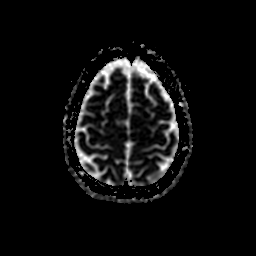
[im 57/57]
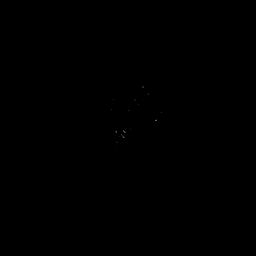

[Series 600: DWI · coronal · 5.0mm · 1.09mm/px · 3 of 36 slices shown (4 of 4)]
[im 1/36]
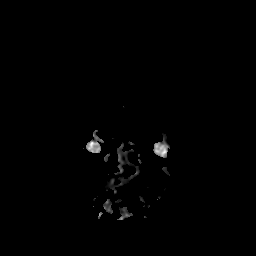
[im 18/36]
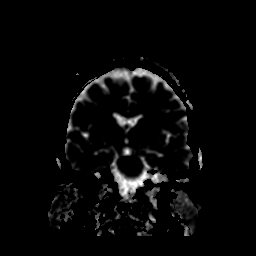
[im 36/36]
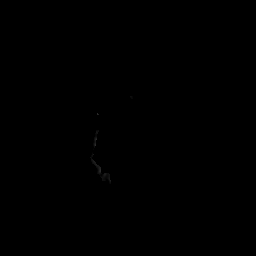

[34 of 48 positions shown; findings below may reference images not displayed]

FINDINGS: The patient was unable to remain motionless for the exam. Small or
subtle lesions could be overlooked.

No evidence for acute infarction, hemorrhage, mass lesion,
hydrocephalus, or extra-axial fluid. Mild subcortical and
periventricular T2 and FLAIR hyperintensities, likely chronic
microvascular ischemic change.

Flow voids are maintained throughout the carotid, basilar, and
vertebral arteries. There are no areas of chronic hemorrhage.

Partial empty sella.  Mild tonsillar ectopia.  No pineal lesion.

Post infusion, no abnormal enhancement of the brain or meninges.
Major dural venous sinuses are patent.

Thin-section imaging through the temporal lobes demonstrates no
hippocampal atrophy or inflammatory process.

Visualized calvarium, skull base, and upper cervical osseous
structures unremarkable. Scalp and extracranial soft tissues,
orbits, sinuses, and mastoids show no acute process.
IMPRESSION: Negative exam.  No acute intracranial findings.

## 2018-03-12 DIAGNOSIS — F3132 Bipolar disorder, current episode depressed, moderate: Secondary | ICD-10-CM | POA: Diagnosis not present

## 2018-03-12 DIAGNOSIS — F4310 Post-traumatic stress disorder, unspecified: Secondary | ICD-10-CM | POA: Diagnosis not present

## 2018-04-01 DIAGNOSIS — R471 Dysarthria and anarthria: Secondary | ICD-10-CM | POA: Diagnosis not present

## 2018-04-01 DIAGNOSIS — Z79899 Other long term (current) drug therapy: Secondary | ICD-10-CM | POA: Diagnosis not present

## 2018-04-01 DIAGNOSIS — R9431 Abnormal electrocardiogram [ECG] [EKG]: Secondary | ICD-10-CM | POA: Diagnosis not present

## 2018-04-01 DIAGNOSIS — Z7982 Long term (current) use of aspirin: Secondary | ICD-10-CM | POA: Diagnosis not present

## 2018-04-01 DIAGNOSIS — Z8673 Personal history of transient ischemic attack (TIA), and cerebral infarction without residual deficits: Secondary | ICD-10-CM | POA: Diagnosis not present

## 2018-04-01 DIAGNOSIS — F4310 Post-traumatic stress disorder, unspecified: Secondary | ICD-10-CM | POA: Diagnosis not present

## 2018-04-01 DIAGNOSIS — Z7901 Long term (current) use of anticoagulants: Secondary | ICD-10-CM | POA: Diagnosis not present

## 2018-04-01 DIAGNOSIS — J449 Chronic obstructive pulmonary disease, unspecified: Secondary | ICD-10-CM | POA: Diagnosis not present

## 2018-04-01 DIAGNOSIS — R531 Weakness: Secondary | ICD-10-CM | POA: Diagnosis not present

## 2018-04-01 DIAGNOSIS — R42 Dizziness and giddiness: Secondary | ICD-10-CM | POA: Diagnosis not present

## 2018-04-01 DIAGNOSIS — Z888 Allergy status to other drugs, medicaments and biological substances status: Secondary | ICD-10-CM | POA: Diagnosis not present

## 2018-04-01 DIAGNOSIS — R4781 Slurred speech: Secondary | ICD-10-CM | POA: Diagnosis not present

## 2018-04-01 DIAGNOSIS — R4182 Altered mental status, unspecified: Secondary | ICD-10-CM | POA: Diagnosis not present

## 2018-04-01 DIAGNOSIS — Z87891 Personal history of nicotine dependence: Secondary | ICD-10-CM | POA: Diagnosis not present

## 2018-04-01 DIAGNOSIS — I639 Cerebral infarction, unspecified: Secondary | ICD-10-CM | POA: Diagnosis not present

## 2018-04-01 DIAGNOSIS — F319 Bipolar disorder, unspecified: Secondary | ICD-10-CM | POA: Diagnosis not present

## 2018-04-10 DIAGNOSIS — E782 Mixed hyperlipidemia: Secondary | ICD-10-CM | POA: Insufficient documentation

## 2018-04-10 DIAGNOSIS — F4310 Post-traumatic stress disorder, unspecified: Secondary | ICD-10-CM | POA: Diagnosis not present

## 2018-04-10 DIAGNOSIS — G459 Transient cerebral ischemic attack, unspecified: Secondary | ICD-10-CM | POA: Diagnosis not present

## 2018-04-10 DIAGNOSIS — Z79899 Other long term (current) drug therapy: Secondary | ICD-10-CM | POA: Insufficient documentation

## 2018-04-10 DIAGNOSIS — F459 Somatoform disorder, unspecified: Secondary | ICD-10-CM

## 2018-04-10 DIAGNOSIS — F17210 Nicotine dependence, cigarettes, uncomplicated: Secondary | ICD-10-CM | POA: Diagnosis not present

## 2018-04-10 DIAGNOSIS — F3132 Bipolar disorder, current episode depressed, moderate: Secondary | ICD-10-CM | POA: Diagnosis not present

## 2018-04-10 HISTORY — DX: Somatoform disorder, unspecified: F45.9

## 2018-04-10 HISTORY — DX: Mixed hyperlipidemia: E78.2

## 2018-04-12 ENCOUNTER — Telehealth: Payer: Self-pay | Admitting: Neurology

## 2018-04-12 NOTE — Telephone Encounter (Signed)
Patient was seen at the ED at [place] at [phone] for slurred speech They state that he needs to follow up with [facility] before is DEC appt and also have Neuropshy testing redone. I spoke with Meagan and she is going to request the records and get back with the patient to let her know when we can see patient.

## 2018-04-12 NOTE — Telephone Encounter (Signed)
I have looked through notes on Care Everywhere and was not able to see where pt was told to be seen sooner.  pls advise.

## 2018-04-13 NOTE — Telephone Encounter (Signed)
Pls let wife know I reviewed the notes, ER recommendations were to follow-up with his psychiatrist to maybe adjust medications. I can see him on 9/17 at 3:30pm, pls also let her know we will put him on Dr. Marcia Brash schedule for repeat Neuropsych testing (her wait time is longer), pls put in order. Thanks

## 2018-04-13 NOTE — Telephone Encounter (Signed)
Patient's wife notified and will have patient here on 04-17-18 at 3:30.

## 2018-04-16 ENCOUNTER — Other Ambulatory Visit: Payer: Self-pay

## 2018-04-16 DIAGNOSIS — R413 Other amnesia: Secondary | ICD-10-CM

## 2018-04-16 DIAGNOSIS — R404 Transient alteration of awareness: Secondary | ICD-10-CM

## 2018-04-16 DIAGNOSIS — F3130 Bipolar disorder, current episode depressed, mild or moderate severity, unspecified: Secondary | ICD-10-CM

## 2018-04-16 DIAGNOSIS — F4310 Post-traumatic stress disorder, unspecified: Secondary | ICD-10-CM

## 2018-04-16 DIAGNOSIS — G40009 Localization-related (focal) (partial) idiopathic epilepsy and epileptic syndromes with seizures of localized onset, not intractable, without status epilepticus: Secondary | ICD-10-CM

## 2018-04-16 NOTE — Telephone Encounter (Signed)
Orders placed for Neuropsych testing with Dr. Si Raider

## 2018-04-17 ENCOUNTER — Ambulatory Visit (INDEPENDENT_AMBULATORY_CARE_PROVIDER_SITE_OTHER): Payer: Medicare Other | Admitting: Psychology

## 2018-04-17 ENCOUNTER — Encounter: Payer: Self-pay | Admitting: Psychology

## 2018-04-17 ENCOUNTER — Telehealth: Payer: Self-pay | Admitting: Neurology

## 2018-04-17 DIAGNOSIS — F3130 Bipolar disorder, current episode depressed, mild or moderate severity, unspecified: Secondary | ICD-10-CM

## 2018-04-17 DIAGNOSIS — R413 Other amnesia: Secondary | ICD-10-CM | POA: Diagnosis not present

## 2018-04-17 DIAGNOSIS — F4310 Post-traumatic stress disorder, unspecified: Secondary | ICD-10-CM

## 2018-04-17 NOTE — Telephone Encounter (Signed)
pls advise
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
## 2018-04-17 NOTE — Telephone Encounter (Signed)
Patient wanted to know if he needed to move his December appointment up sooner or keep it? He was seen by Dr. Si Raider today and she recommended they cancel the testing appointment/ Please Advise. Thanks

## 2018-04-17 NOTE — Telephone Encounter (Signed)
Patient is unable to come tomorrow due to having [facility] at [facility] time. If anything else comes available please let him know. Thanks

## 2018-04-17 NOTE — Telephone Encounter (Signed)
If they want to come back tomorrow, I have a cancellation at 3:30pm. THanks

## 2018-04-18 ENCOUNTER — Ambulatory Visit: Payer: Medicare Other | Admitting: Neurology

## 2018-04-30 DIAGNOSIS — R7301 Impaired fasting glucose: Secondary | ICD-10-CM | POA: Diagnosis not present

## 2018-04-30 DIAGNOSIS — E782 Mixed hyperlipidemia: Secondary | ICD-10-CM | POA: Diagnosis not present

## 2018-04-30 DIAGNOSIS — E559 Vitamin D deficiency, unspecified: Secondary | ICD-10-CM | POA: Diagnosis not present

## 2018-05-03 DIAGNOSIS — R296 Repeated falls: Secondary | ICD-10-CM | POA: Diagnosis not present

## 2018-05-03 DIAGNOSIS — F17210 Nicotine dependence, cigarettes, uncomplicated: Secondary | ICD-10-CM | POA: Diagnosis not present

## 2018-05-03 DIAGNOSIS — M25511 Pain in right shoulder: Secondary | ICD-10-CM | POA: Diagnosis not present

## 2018-05-03 DIAGNOSIS — G8929 Other chronic pain: Secondary | ICD-10-CM | POA: Diagnosis not present

## 2018-05-03 DIAGNOSIS — M542 Cervicalgia: Secondary | ICD-10-CM | POA: Diagnosis not present

## 2018-05-03 DIAGNOSIS — R7301 Impaired fasting glucose: Secondary | ICD-10-CM | POA: Diagnosis not present

## 2018-05-03 DIAGNOSIS — Z79899 Other long term (current) drug therapy: Secondary | ICD-10-CM | POA: Diagnosis not present

## 2018-05-03 DIAGNOSIS — Z8673 Personal history of transient ischemic attack (TIA), and cerebral infarction without residual deficits: Secondary | ICD-10-CM

## 2018-05-03 DIAGNOSIS — M25512 Pain in left shoulder: Secondary | ICD-10-CM | POA: Diagnosis not present

## 2018-05-03 DIAGNOSIS — M545 Low back pain: Secondary | ICD-10-CM | POA: Diagnosis not present

## 2018-05-03 DIAGNOSIS — G894 Chronic pain syndrome: Secondary | ICD-10-CM | POA: Diagnosis not present

## 2018-05-03 DIAGNOSIS — E782 Mixed hyperlipidemia: Secondary | ICD-10-CM | POA: Diagnosis not present

## 2018-05-03 HISTORY — DX: Personal history of transient ischemic attack (TIA), and cerebral infarction without residual deficits: Z86.73

## 2018-07-27 ENCOUNTER — Other Ambulatory Visit: Payer: Self-pay

## 2018-07-30 ENCOUNTER — Ambulatory Visit (INDEPENDENT_AMBULATORY_CARE_PROVIDER_SITE_OTHER): Payer: Medicare Other | Admitting: Neurology

## 2018-07-30 ENCOUNTER — Other Ambulatory Visit: Payer: Self-pay

## 2018-07-30 ENCOUNTER — Encounter: Payer: Self-pay | Admitting: Neurology

## 2018-07-30 VITALS — BP 130/72 | HR 86 | Ht 69.0 in | Wt 245.0 lb

## 2018-07-30 DIAGNOSIS — G40009 Localization-related (focal) (partial) idiopathic epilepsy and epileptic syndromes with seizures of localized onset, not intractable, without status epilepticus: Secondary | ICD-10-CM

## 2018-07-30 DIAGNOSIS — F4310 Post-traumatic stress disorder, unspecified: Secondary | ICD-10-CM | POA: Diagnosis not present

## 2018-07-30 MED ORDER — LAMOTRIGINE 100 MG PO TABS: 100.0000 mg | ORAL_TABLET | Freq: Two times a day (BID) | ORAL | 3 refills | Status: DC

## 2018-07-30 NOTE — Progress Notes (Signed)
NEUROLOGY FOLLOW UP OFFICE NOTE  Patrick Gregory [id number]  DOB: [birth year]  HISTORY OF PRESENT ILLNESS: I had the pleasure of seeing Patrick Gregory in follow-up in the neurology clinic on 07/30/2018.  He is again accompanied by his wife who helps supplement the history today. The patient was last seen 7 months ago for recurrent episodes concerning for seizures with episodes of unresponsiveness with falls. He had a 48-hour EEG done which showed occasional focal slowing over the left temporal region, no epileptiform discharges seen, typical events not captured. He was reporting a seizure every 1-2 weeks, his wife would come home and find a goose egg on his head. He and his wife report that his last seizure was around 6- 7 months ago when he ran out of Lamotrigine. He had overall been doing well with no episodes of unresponsiveness with initiation of Lamotrigine 100mg  BID, no side effects. He has had repeated hospital visits for left-sided weakness, he was at [facility] in May 2019 for left-sided weakness, dropping things from his left hand, slurred speech, and gibberish speech. He had a left-sideed facial droop per wife. MRI brain, echo, carotid dopplers were normal. Concern for polypharmacy was raised. He was in the ER again in September 2019 for left-sided weakness, slurred speech, and difficulty walking. He was also reporting visual hallucinations (believing a man was in the house). Symptoms were resolving upon arrival to the ER. He was evaluated by Tele-neurology with no weakness appreciated. MRI brain no acute changes, CTA unrevealing. He was evaluated by [facility] and outpatient psychiatry follow-up was recommended. His wife reports that they were told he had a mini-stroke and that he was overmedicated. He has decreased clonazepam dose to 2.5 tablets daily through his [place] psychiatrist. He has good and bad days, sometimes he is more tired and sleepy and would tend to bump into things with  worse balance. When he is more energized, he does good with his balance. He had a fall last week without injuries.   HPI 03/03/2016: This is a pleasant 57 yo RH man with a history of COPD, chronic neck and back pain, PTSD, who presented for seizures. He reports that the first seizure occurred 3-4 years ago, he woke up on the floor then 2-3 hours later passed out again. He denied any prior warning symptoms. He was started on an unrecalled seizure medication which he took for a year then self-discontinued because he had been seizure-free.  He was event-free until January 2017 when he had 3 episodes in a week. The first episode he was sitting on the couch when his head went back and he became unresponsive for a few seconds, eyes were closed. The next day, he was working on something on the wall when his uncle noticed he looked lost. He called to him and he snapped back. The third episode occurred 2 days later, again while sitting on the couch, his head went back with eyes closed, then he had mild jerking of both arms for a few seconds. No tongue bite or incontinence. When he came to, he told his fiancee he had a headache and felt nauseated. He had another mild episode 3 weeks ago. His is amnestic of these episodes, his whole body feels weak after, no focal weakness. He and his fiancee deny any staring/unresponsive episodes, no gaps in time. He denies any  olfactory/gustatory hallucinations, deja vu, rising epigastric sensation, myoclonic jerks. When he turns his head to the right, he has numbness over the left  arm. He reports having degenerative disc disease and sees neurosurgery. He was started on Keppra 500mg  and denies any side effects since taking it the past 3 days. He was in the ER last 02/25/16 for gait difficulty, he was staggering, walking like was drunk. He was evaluated by Neurology at [facility], no focal weakness seen, finger to nose testing appeared functional. Symptoms lasted 2-3 days. He had not started Keppra  when these symptoms began. He had an MRI brain with and without contrast which I personally reviewed, there were no acute changes, hippocampi symmetric with no abnormal signal or enhancement seen. He had a head CT the next day with no acute changes. The gait instability has since resolved.  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 He has had several head injuries with loss of consciousness while he was in [employer], otherwise he had a normal birth and early development.  There is no history of febrile convulsions, CNS infections such as meningitis/encephalitis, neurosurgical procedures, or family history of seizures.  He was also reporting memory loss and underwent Neuropsychological evaluation with a diagnosis of Bipolar disorder, current episode depressed; PTSD (by history). Per report, "Results of cognitive testing could not be interpreted due to evidence of sub-optimal effort on formal testing. A finding of sub-optimal effort in combination with severely impaired performances across all aspects of testing is commonly seen in individuals with somatoform disorders such as PNES. Additionally, he presents with significant emotional distress and relatively severe psychopathology including bipolar disorder (currently depressed with suicidal ideation) and PTSD; primary psychiatric disorders such as these are often present in individuals with PNES as well. Regardless of whether or not he has PNES, it is clear that his psychiatric disorders are significantly and negatively affecting his daily functioning and likely his cognitive functioning to at least some degree. Ongoing and intensive mental health treatment is certainly indicated, including ongoing risk assessment and crisis intervention as indicated." He continues to see a psychiatrist and a therapist every month.  PAST MEDICAL  HISTORY: Past Medical History:  Diagnosis Date  . Allergic rhinitis   . Arthritis    shoulders, back  . Asthma   . Bipolar disorder ([place])   . Chickenpox   . COPD (chronic obstructive pulmonary disease) ([place])   . Depression   . Low back pain   . Seizures ([place])    last one approx 18 mos ago.  No meds approx 1 yr.  . Sleep apnea    supposed to use CPAP. Does not have.  . Stiff neck    limited side to side turn.  Left side pain when looking up.  . Throat cancer [facility])     MEDICATIONS:  Outpatient Encounter Medications as of 07/30/2018  Medication Sig Note  . albuterol (PROVENTIL HFA;VENTOLIN HFA) 108 (90 Base) MCG/ACT inhaler Inhale 2 puffs every 6 (six) hours as needed into the lungs for wheezing or shortness of breath.   Marland Kitchen amoxicillin-clavulanate (AUGMENTIN) 875-125 MG tablet Take 1 tablet 2 (two) times daily by mouth.   Marland Kitchen aspirin 81 MG EC tablet Take by mouth.   Marland Kitchen atorvastatin (LIPITOR) 20 MG tablet Take by mouth.   . benztropine (COGENTIN) 1 MG tablet Take by mouth.   . cyclobenzaprine (FLEXERIL) 10 MG tablet Take 1 tablet (10 mg total) by mouth 3 (three) times daily.   Marland Kitchen lamoTRIgine (LAMICTAL) 100 MG tablet  Take 1 tablet (100 mg total) by mouth 2 (two) times daily.   . Lurasidone HCl 60 MG TABS Take 80 mg by mouth at bedtime.    Marland Kitchen PARoxetine (PAXIL) 20 MG tablet Take by mouth.   . pregabalin (LYRICA) 75 MG capsule Take 1 capsule (75 mg total) by mouth 2 (two) times daily. 03/06/2017: Still taking.    No facility-administered encounter medications on file as of 07/30/2018.     ALLERGIES: No Known Allergies  FAMILY HISTORY: Family History  Problem Relation Age of Onset  . Alcoholism Unknown   . Arthritis Unknown   . Hyperlipidemia Unknown   . Heart disease Unknown   . Sudden death Unknown   . Kidney disease Unknown   . Mental illness Unknown   . Depression Mother   . Bipolar disorder Mother   . Depression Brother   . Bipolar disorder Brother   . Heart disease  Brother   . Heart disease Brother     SOCIAL HISTORY: Social History   Socioeconomic History  . Marital status: Single    Spouse name: Not on file  . Number of children: Not on file  . Years of education: Not on file  . Highest education level: Not on file  Occupational History  . Occupation: retired    Comment: Wellsite geologist  . Financial resource strain: Not on file  . Food insecurity:    Worry: Not on file    Inability: Not on file  . Transportation needs:    Medical: Not on file    Non-medical: Not on file  Tobacco Use  . Smoking status: Former Smoker    Packs/day: 1.00    Years: 35.00    Pack years: 35.00    Types: Cigarettes    Last attempt to quit: 05/01/2014    Years since quitting: 4.2  . Smokeless tobacco: Never Used  Substance and Sexual Activity  . Alcohol use: No    Alcohol/week: 0.0 standard drinks  . Drug use: No  . Sexual activity: Not on file  Lifestyle  . Physical activity:    Days per week: Not on file    Minutes per session: Not on file  . Stress: Not on file  Relationships  . Social connections:    Talks on phone: Not on file    Gets together: Not on file    Attends religious service: Not on file    Active member of club or organization: Not on file    Attends meetings of clubs or organizations: Not on file    Relationship status: Not on file  . Intimate partner violence:    Fear of current or ex partner: Not on file    Emotionally abused: Not on file    Physically abused: Not on file    Forced sexual activity: Not on file  Other Topics Concern  . Not on file  Social History Narrative  . Not on file    REVIEW OF SYSTEMS: Constitutional: No fevers, chills, or sweats, no generalized fatigue, change in appetite Eyes: No visual changes, double vision, eye pain Ear, nose and throat: No hearing loss, ear pain, nasal congestion, sore throat Cardiovascular: No chest pain, palpitations Respiratory:  No shortness of breath at rest  or with exertion, wheezes GastrointestinaI: No nausea, vomiting, diarrhea, abdominal pain, fecal incontinence Genitourinary:  No dysuria, urinary retention or frequency Musculoskeletal:  + neck pain, back pain Integumentary: No rash, pruritus, skin lesions Neurological: as above Psychiatric: +  depression, insomnia, anxiety Endocrine: No palpitations, fatigue, diaphoresis, mood swings, change in appetite, change in weight, increased thirst Hematologic/Lymphatic:  No anemia, purpura, petechiae. Allergic/Immunologic: no itchy/runny eyes, nasal congestion, recent allergic reactions, rashes  PHYSICAL EXAM: Vitals:   07/30/18 1539  BP: 130/72  Pulse: 86  SpO2: 92%   General: No acute distress Head:  Normocephalic/atraumatic Neck: supple, no paraspinal tenderness, full range of motion Heart:  Regular rate and rhythm Lungs:  Clear to auscultation bilaterally Back: No paraspinal tenderness Skin/Extremities: No rash, no edema Neurological Exam: alert and oriented to person, place, and month/year, day of week. No aphasia or dysarthria. Fund of knowledge is appropriate.  Remote memory intact. Attention and concentration are normal.    Able to name objects and repeat phrases. Cranial nerves: Pupils equal, round, reactive to light. Extraocular movements intact with no nystagmus. Visual fields full. Facial sensation intact. No facial asymmetry. Tongue, uvula, palate midline.  Motor: Bulk and tone normal, muscle strength 5/5 throughout with no pronator drift.  Sensation to light touch intact.  No extinction to double simultaneous stimulation.  Deep tendon reflexes 2+ throughout, toes downgoing.  Finger to nose testing intact.  Gait narrow-based and steady, mild difficulty with tandem walk but able.  IMPRESSION: This is a pleasant 57 yo RH man with a history of COPD, chronic neck and back pain, PTSD, with a history of seizures since 2013/2014. His EEG showed occasional left temporal slowing, no  epileptiform discharges. MRI brain unremarkable. 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 He has had 2 hospital visits for transient left-sided weakness of unclear etiology, possibly psychiatric. Repeat MRI brain and stroke workup had been unremarkable. 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 Continue follow-up with Behavioral Health. Continue daily aspirin and control of vascular risk factors. He is aware of [place] driving laws to stop driving after a seizure, until 6 months seizure-free. He will follow-up in 8 months and knows to call for any changes.   Thank you for allowing me to participate in his care.  Please do not hesitate to call for any questions or concerns.  The duration of this appointment visit was 20 minutes of face-to-face time with the patient.  Greater than 50% of this time was spent in counseling, explanation of diagnosis, planning of further management, and coordination of care.   Ellouise Newer, M.D.   CC: Dr. Martina Sinner

## 2018-07-30 NOTE — Patient Instructions (Signed)
1. Continue Lamotrigine 100mg  twice a day  2. Continue follow-up with Psychiatry and therapy  3. Follow-up in 8 months, call for any changes  Seizure Precautions: 1. If medication has been prescribed for you to prevent seizures, take it exactly as directed.  Do not stop taking the medicine without talking to your doctor first, even if you have not had a seizure in a long time.   2. Avoid activities in which a seizure would cause danger to yourself or to others.  Don't operate dangerous machinery, swim alone, or climb in high or dangerous places, such as on ladders, roofs, or girders.  Do not drive unless your doctor says you may.  3. If you have any warning that you may have a seizure, lay down in a safe place where you can't hurt yourself.    4.  No driving for 6 months from last seizure, as per [facility].   Please refer to the following link on the [facility] website for more information: http://[email or web address]/[email or web address]/[email or web address]   5.  Maintain good sleep hygiene. Avoid alcohol.  6.  Contact your doctor if you have any problems that may be related to the medicine you are taking.  7.  Call 911 and bring the patient back to the ED if:        A.  The seizure lasts longer than 5 minutes.       B.  The patient doesn't awaken shortly after the seizure  C.  The patient has new problems such as difficulty seeing, speaking or moving  D.  The patient was injured during the seizure  E.  The patient has a temperature over 102 F (39C)  F.  The patient vomited and now is having trouble breathing

## 2018-10-15 DIAGNOSIS — M159 Polyosteoarthritis, unspecified: Secondary | ICD-10-CM

## 2018-10-15 DIAGNOSIS — M79671 Pain in right foot: Secondary | ICD-10-CM

## 2018-10-15 DIAGNOSIS — M1990 Unspecified osteoarthritis, unspecified site: Secondary | ICD-10-CM | POA: Insufficient documentation

## 2018-10-15 DIAGNOSIS — M8949 Other hypertrophic osteoarthropathy, multiple sites: Secondary | ICD-10-CM

## 2018-10-15 DIAGNOSIS — M150 Primary generalized (osteo)arthritis: Secondary | ICD-10-CM

## 2018-10-15 HISTORY — DX: Other hypertrophic osteoarthropathy, multiple sites: M89.49

## 2018-10-15 HISTORY — DX: Primary generalized (osteo)arthritis: M15.0

## 2018-10-15 HISTORY — DX: Pain in right foot: M79.671

## 2018-10-15 HISTORY — DX: Polyosteoarthritis, unspecified: M15.9

## 2019-01-11 ENCOUNTER — Telehealth: Payer: Self-pay | Admitting: Neurology

## 2019-01-11 NOTE — Telephone Encounter (Signed)
Wife was calling in about a seizure- throwing before seizure this time. Needing a F/U and possible Brain scan. Please let us know how to schedule the F/U. Balance is getting worse and [facility] recommended [facility] Dr. Delice Lesch. Thanks!

## 2019-01-11 NOTE — Telephone Encounter (Signed)
Pls schedule e-visit and pls request for notes from his [place] provider, thanks!

## 2019-01-25 ENCOUNTER — Other Ambulatory Visit: Payer: Self-pay

## 2019-01-25 ENCOUNTER — Telehealth (INDEPENDENT_AMBULATORY_CARE_PROVIDER_SITE_OTHER): Payer: Medicare Other | Admitting: Neurology

## 2019-01-25 DIAGNOSIS — G40009 Localization-related (focal) (partial) idiopathic epilepsy and epileptic syndromes with seizures of localized onset, not intractable, without status epilepticus: Secondary | ICD-10-CM | POA: Diagnosis not present

## 2019-01-25 MED ORDER — LAMOTRIGINE 100 MG PO TABS: ORAL_TABLET | ORAL | 3 refills | Status: DC

## 2019-01-25 NOTE — Progress Notes (Signed)
Virtual Visit via Video Note The purpose of this virtual visit is to provide medical care while limiting exposure to the novel coronavirus.    Consent was obtained for video visit:  Yes.   Answered questions that patient had about telehealth interaction:  Yes.   I discussed the limitations, risks, security and privacy concerns of performing an evaluation and management service by telemedicine. I also discussed with the patient that there may be a patient responsible charge related to this service. The patient expressed understanding and agreed to proceed.  Pt location: Home Physician Location: office Name of referring provider:  Azell Der, MD I connected with Patrick Gregory at patients initiation/request on 01/25/2019 at  3:00 PM EDT by video enabled telemedicine application and verified that I am speaking with the correct person using two identifiers. Pt MRN:  [id number] Pt DOB:  08/10/1960 Video Participants:  Patrick Gregory; Catalina Pizza (significant other)   History of Present Illness:  The patient was last seen in December 2019 for recurrent episodes concerning for seizures with episodes of unresponsiveness with falls. His significant other is present during the e-visit to provide additional information. He had a 48-hour EEG done which showed occasional focal slowing over the left temporal region, no epileptiform discharges seen, typical events not captured. On his last visit, they reported he was doing well seizure-free for 6-7 months on Lamotrigine 100mg  BID. He had a seizure at the beginning of June which he feels was precipitated by coughing. He recalls getting a little sick to his stomach then he vomited. His daughter saw him fall to the floor with loss of consciousness. No convulsive activity but one of his hands twitched. He hit his left elbow and knee. He denies missing medication. They also report sleepwalking, she is not sure how often he does it, but one time she woke up and found the  front door open with the alarm turned off. He does not remember doing this. His wife has not witnessed any staring/unresponsive episodes. He has been dealing with arthritis/gout and started on a new anti-inflammatory. His right foot is starting to feel better but he had fractured the little toe on his left foot and this bothers him still. He has cut down on beer. They also report bumping into things, he thinks he has enough space but he still runs into furniture. They have a referral for OT for a shower chair.   HPI 03/03/2016: This is a pleasant 58 yo RH man with a history of COPD, chronic neck and back pain, PTSD, who presented for seizures. He reports that the first seizure occurred 3-4 years ago, he woke up on the floor then 2-3 hours later passed out again. He denied any prior warning symptoms. He was started on an unrecalled seizure medication which he took for a year then self-discontinued because he had been seizure-free.  He was event-free until January 2017 when he had 3 episodes in a week. The first episode he was sitting on the couch when his head went back and he became unresponsive for a few seconds, eyes were closed. The next day, he was working on something on the wall when his uncle noticed he looked lost. He called to him and he snapped back. The third episode occurred 2 days later, again while sitting on the couch, his head went back with eyes closed, then he had mild jerking of both arms for a few seconds. No tongue bite or incontinence. When he came to, he  told his fiancee he had a headache and felt nauseated. He had another mild episode 3 weeks ago. His is amnestic of these episodes, his whole body feels weak after, no focal weakness. He and his fiancee deny any staring/unresponsive episodes, no gaps in time. He denies any  olfactory/gustatory hallucinations, deja vu, rising epigastric sensation, myoclonic jerks. When he turns his head to the right, he has numbness over the left arm. He reports  having degenerative disc disease and sees neurosurgery. He was started on Keppra 500mg  and denies any side effects since taking it the past 3 days. He was in the ER last 02/25/16 for gait difficulty, he was staggering, walking like was drunk. He was evaluated by Neurology at [facility], no focal weakness seen, finger to nose testing appeared functional. Symptoms lasted 2-3 days. He had not started Keppra when these symptoms began. He had an MRI brain with and without contrast which I personally reviewed, there were no acute changes, hippocampi symmetric with no abnormal signal or enhancement seen. He had a head CT the next day with no acute changes. The gait instability has since resolved.  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 He has had several head injuries with loss of consciousness while he was in [employer], otherwise he had a normal birth and early development.  There is no history of febrile convulsions, CNS infections such as meningitis/encephalitis, neurosurgical procedures, or family history of seizures.  He was also reporting memory loss and underwent Neuropsychological evaluation with a diagnosis of Bipolar disorder, current episode depressed; PTSD (by history). Per report, "Results of cognitive testing could not be interpreted due to evidence of sub-optimal effort on formal testing. A finding of sub-optimal effort in combination with severely impaired performances across all aspects of testing is commonly seen in individuals with somatoform disorders such as PNES. Additionally, he presents with significant emotional distress and relatively severe psychopathology including bipolar disorder (currently depressed with suicidal ideation) and PTSD; primary psychiatric disorders such as these are often present in individuals with PNES as well. Regardless of whether or not he has PNES, it is  clear that his psychiatric disorders are significantly and negatively affecting his daily functioning and likely his cognitive functioning to at least some degree. Ongoing and intensive mental health treatment is certainly indicated, including ongoing risk assessment and crisis intervention as indicated." He continues to see a psychiatrist and a therapist every month.  Update 07/30/2018: He has had repeated hospital visits for left-sided weakness, he was at [facility] in May 2019 for left-sided weakness, dropping things from his left hand, slurred speech, and gibberish speech. He had a left-sideed facial droop per wife. MRI brain, echo, carotid dopplers were normal. Concern for polypharmacy was raised. He was in the ER again in September 2019 for left-sided weakness, slurred speech, and difficulty walking. He was also reporting visual hallucinations (believing a man was in the house). Symptoms were resolving upon arrival to the ER. He was evaluated by Tele-neurology with no weakness appreciated. MRI brain no acute changes, CTA unrevealing. He was evaluated by [facility] and outpatient psychiatry follow-up was recommended. His wife reports that they were told he had a mini-stroke and that he was overmedicated. He has decreased clonazepam dose to 2.5 tablets daily through his [place] psychiatrist. He has good and bad days, sometimes he is more tired and sleepy and would  tend to bump into things with worse balance. When he is more energized, he does good with his balance. He had a fall last week without injuries.   Diagnostic Data: MRI brain done at [facility] in May and September 2019 reported as normal 48-hour EEG done 06/2016 showed occasional focal slowing over the left temporal region, no epileptiform discharges. Typical events were not captured.   Current Outpatient Medications on File Prior to Visit  Medication Sig Dispense Refill   albuterol (PROVENTIL HFA;VENTOLIN HFA) 108 (90 Base) MCG/ACT  inhaler Inhale 2 puffs every 6 (six) hours as needed into the lungs for wheezing or shortness of breath. 3 Inhaler 2   atorvastatin (LIPITOR) 20 MG tablet Take by mouth.     benztropine (COGENTIN) 1 MG tablet Take by mouth.     cyclobenzaprine (FLEXERIL) 10 MG tablet Take 1 tablet (10 mg total) by mouth 3 (three) times daily. 90 tablet 0   lamoTRIgine (LAMICTAL) 100 MG tablet Take 1 tablet (100 mg total) by mouth 2 (two) times daily. 180 tablet 3   Lurasidone HCl 60 MG TABS Take 80 mg by mouth at bedtime.      omeprazole (PRILOSEC) 20 MG capsule      PARoxetine (PAXIL) 30 MG tablet Take by mouth.      pregabalin (LYRICA) 75 MG capsule Take 1 capsule (75 mg total) by mouth 2 (two) times daily. 60 capsule 0   traZODone (DESYREL) 150 MG tablet Take 150 mg by mouth at bedtime.     venlafaxine XR (EFFEXOR-XR) 75 MG 24 hr capsule Take 3 capsules by mouth every morning.     No current facility-administered medications on file prior to visit.      Observations/Objective:   GEN:  The patient appears stated age and is in NAD.  Neurological examination: Patient is awake, alert, oriented x 3. No aphasia or dysarthria. Intact fluency and comprehension. Remote and recent memory intact. Able to name and repeat. Cranial nerves: Extraocular movements intact with no nystagmus. No facial asymmetry. Motor: moves all extremities symmetrically, at least anti-gravity x 4.   Assessment and Plan:   This is a pleasant 58 yo RH man with a history of COPD, chronic neck and back pain, PTSD, with a history of seizures since 2013/2014. His EEG showed occasional left temporal slowing, no epileptiform discharges. MRI brain unremarkable. 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 He had a breakthrough seizure in June after being seizure-free for more than 6 months. We discussed increasing Lamotrigine to 150mg  BID. He was  encouraged to continue with alcohol cessation. Continue follow-up with Behavioral Health. Continue daily aspirin and control of vascular risk factors. He is aware of [place] driving laws to stop driving after a seizure, until 6 months seizure-free. He will follow-up in 4-5 months and knows to call for any changes.    Follow Up Instructions:    -I discussed the assessment and treatment plan with the patient. The patient was provided an opportunity to ask questions and all were answered. The patient agreed with the plan and demonstrated an understanding of the instructions.   The patient was advised to call back or seek an in-person evaluation if the symptoms worsen or if the condition fails to improve as anticipated.     Cameron Sprang, MD

## 2019-02-05 ENCOUNTER — Telehealth: Payer: Self-pay | Admitting: Neurology

## 2019-02-05 NOTE — Telephone Encounter (Signed)
Patient wife needs to talk to someone about medication dosage change  Please call

## 2019-02-05 NOTE — Telephone Encounter (Signed)
Left message for pt or wife to return call.

## 2019-02-06 NOTE — Telephone Encounter (Signed)
No answer again.  [facility] Dr[facility] Del[facility] Records mailed to pt.

## 2019-03-04 ENCOUNTER — Ambulatory Visit: Payer: Medicare Other | Admitting: Neurology

## 2019-03-11 ENCOUNTER — Telehealth (INDEPENDENT_AMBULATORY_CARE_PROVIDER_SITE_OTHER): Payer: Medicare Other | Admitting: Neurology

## 2019-03-11 ENCOUNTER — Other Ambulatory Visit: Payer: Self-pay

## 2019-03-11 ENCOUNTER — Encounter: Payer: Self-pay | Admitting: Neurology

## 2019-03-11 VITALS — Ht 69.0 in | Wt 247.0 lb

## 2019-03-11 DIAGNOSIS — G40009 Localization-related (focal) (partial) idiopathic epilepsy and epileptic syndromes with seizures of localized onset, not intractable, without status epilepticus: Secondary | ICD-10-CM | POA: Diagnosis not present

## 2019-03-11 DIAGNOSIS — R2689 Other abnormalities of gait and mobility: Secondary | ICD-10-CM

## 2019-03-11 NOTE — Progress Notes (Signed)
Virtual Visit via Video Note The purpose of this virtual visit is to provide medical care while limiting exposure to the novel coronavirus.    Consent was obtained for video visit:  Yes.   Answered questions that patient had about telehealth interaction:  Yes.   I discussed the limitations, risks, security and privacy concerns of performing an evaluation and management service by telemedicine. I also discussed with the patient that there may be a patient responsible charge related to this service. The patient expressed understanding and agreed to proceed.  Pt location: Home Physician Location: office Name of referring provider:  Azell Der, MD I connected with Dallas Breeding at patients initiation/request on 03/11/2019 at 11:30 AM EDT by video enabled telemedicine application and verified that I am speaking with the correct person using two identifiers. Pt MRN:  [id number] Pt DOB:  1960/08/16 Video Participants:  Dallas Breeding;  Tiffany (daughter)  History of Present Illness:  The patient was seen as a virtual video visit on 03/11/2019. He was last seen 2 months ago for seizures. He did well seizure-free for 6-7 months until he had a seizure in June where he felt sick to his stomach, vomited, then fell on the floor with loss of consciousness. No convulsive activity but hand twitched. Lamotrigine was increased to 150mg  BID. He and his family deny any further seizures since June, he feels better on the higher dose. His main concern is his mobility, even if he sees something and tries to go around it, he still hits it. He has fallen a few times. He is also having pain and swelling in the balls of his feet due to gout. He denies any headaches, dizziness, diplopia, focal numbness/tingling/weakness.   HPI 03/03/2016: This is a pleasant 58 yo RH man with a history of COPD, chronic neck and back pain, PTSD, who presented for seizures. He reports that the first seizure occurred 3-4 years ago, he woke up on the  floor then 2-3 hours later passed out again. He denied any prior warning symptoms. He was started on an unrecalled seizure medication which he took for a year then self-discontinued because he had been seizure-free.  He was event-free until January 2017 when he had 3 episodes in a week. The first episode he was sitting on the couch when his head went back and he became unresponsive for a few seconds, eyes were closed. The next day, he was working on something on the wall when his uncle noticed he looked lost. He called to him and he snapped back. The third episode occurred 2 days later, again while sitting on the couch, his head went back with eyes closed, then he had mild jerking of both arms for a few seconds. No tongue bite or incontinence. When he came to, he told his fiancee he had a headache and felt nauseated. He had another mild episode 3 weeks ago. His is amnestic of these episodes, his whole body feels weak after, no focal weakness. He and his fiancee deny any staring/unresponsive episodes, no gaps in time. He denies any  olfactory/gustatory hallucinations, deja vu, rising epigastric sensation, myoclonic jerks. When he turns his head to the right, he has numbness over the left arm. He reports having degenerative disc disease and sees neurosurgery. He was started on Keppra 500mg  and denies any side effects since taking it the past 3 days. He was in the ER last 02/25/16 for gait difficulty, he was staggering, walking like was drunk. He was evaluated  by Neurology at [facility], no focal weakness seen, finger to nose testing appeared functional. Symptoms lasted 2-3 days. He had not started Keppra when these symptoms began. He had an MRI brain with and without contrast which I personally reviewed, there were no acute changes, hippocampi symmetric with no abnormal signal or enhancement seen. He had a head CT the next day with no acute changes. The gait instability has since resolved.  He has been seeing a therapist  and psychiatrist for bipolar disorder and been told the seizures may be psychogenic from his PTSD, which he feels may be the case. He has had several head injuries with loss of consciousness while he was in [employer], otherwise he had a normal birth and early development.  There is no history of febrile convulsions, CNS infections such as meningitis/encephalitis, neurosurgical procedures, or family history of seizures.  He was also reporting memory loss and underwent Neuropsychological evaluation with a diagnosis of Bipolar disorder, current episode depressed; PTSD (by history). Per report, "Results of cognitive testing could not be interpreted due to evidence of sub-optimal effort on formal testing. A finding of sub-optimal effort in combination with severely impaired performances across all aspects of testing is commonly seen in individuals with somatoform disorders such as PNES. Additionally, he presents with significant emotional distress and relatively severe psychopathology including bipolar disorder (currently depressed with suicidal ideation) and PTSD; primary psychiatric disorders such as these are often present in individuals with PNES as well. Regardless of whether or not he has PNES, it is clear that his psychiatric disorders are significantly and negatively affecting his daily functioning and likely his cognitive functioning to at least some degree. Ongoing and intensive mental health treatment is certainly indicated, including ongoing risk assessment and crisis intervention as indicated." He continues to see a psychiatrist and a therapist every month.  Update 07/30/2018: He has had repeated hospital visits for left-sided weakness, he was at [facility] in May 2019 for left-sided weakness, dropping things from his left hand, slurred speech, and gibberish speech. He had a left-sideed facial droop per wife. MRI brain, echo, carotid dopplers were normal. Concern for polypharmacy was raised. He  was in the ER again in September 2019 for left-sided weakness, slurred speech, and difficulty walking. He was also reporting visual hallucinations (believing a man was in the house). Symptoms were resolving upon arrival to the ER. He was evaluated by Tele-neurology with no weakness appreciated. MRI brain no acute changes, CTA unrevealing. He was evaluated by [facility] and outpatient psychiatry follow-up was recommended. His wife reports that they were told he had a mini-stroke and that he was overmedicated. He has decreased clonazepam dose to 2.5 tablets daily through his [place] psychiatrist. He has good and bad days, sometimes he is more tired and sleepy and would tend to bump into things with worse balance. When he is more energized, he does good with his balance. He had a fall last week without injuries.   Diagnostic Data: MRI brain done at [facility] in May and September 2019 reported as normal 48-hour EEG done 06/2016 showed occasional focal slowing over the left temporal region, no epileptiform discharges. Typical events were not captured.   Current Outpatient Medications on File Prior to Visit  Medication Sig Dispense Refill  . albuterol (PROVENTIL HFA;VENTOLIN HFA) 108 (90 Base) MCG/ACT inhaler Inhale 2 puffs every 6 (six) hours as needed into the lungs for wheezing or shortness of breath. 3 Inhaler 2  . atorvastatin (LIPITOR) 20 MG tablet Take  by mouth.    . benztropine (COGENTIN) 1 MG tablet Take by mouth.    . lamoTRIgine (LAMICTAL) 100 MG tablet Take 1.5 tablets twice a day 270 tablet 3  . Lurasidone HCl 60 MG TABS Take 80 mg by mouth at bedtime.     Marland Kitchen omeprazole (PRILOSEC) 20 MG capsule     . PARoxetine (PAXIL) 30 MG tablet Take by mouth.     . traZODone (DESYREL) 150 MG tablet Take 150 mg by mouth at bedtime as needed.     . venlafaxine XR (EFFEXOR-XR) 75 MG 24 hr capsule Take 3 capsules by mouth every morning.    . cyclobenzaprine (FLEXERIL) 10 MG tablet Take 1 tablet (10 mg total) by  mouth 3 (three) times daily. 90 tablet 0  . pregabalin (LYRICA) 75 MG capsule Take 1 capsule (75 mg total) by mouth 2 (two) times daily. 60 capsule 0   No current facility-administered medications on file prior to visit.      Observations/Objective:   GEN:  The patient appears stated age and is in NAD.  Neurological examination: Patient is awake, alert, oriented x 3. No aphasia or dysarthria. Intact fluency and comprehension. Remote and recent memory intact. Able to name and repeat. Cranial nerves: Extraocular movements intact with no nystagmus. No facial asymmetry. Motor: moves all extremities symmetrically, at least anti-gravity x 4. Gait: slightly wide-based, cautious, no ataxia  Assessment and Plan:   This is a pleasant 58 yo RH man with a history of COPD, chronic neck and back pain, PTSD, with a history of seizures since 2013/2014. His EEG showed occasional left temporal slowing, no epileptiform discharges. MRI brain unremarkable. 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 He had a breakthrough seizure in June after being seizure-free for more than 6 months, now on higher dose Lamotrigine 150mg  BID. No further events in the past 2 months. Proceed with plans for PT/Balance therapy as planned. Continue follow-up with Behavioral Health. Continue daily aspirin and control of vascular risk factors. He is aware of [place] driving laws to stop driving after a seizure, until 6 months seizure-free. He will follow-up in 6 months and knows to call for any changes.    Follow Up Instructions:   -I discussed the assessment and treatment plan with the patient. The patient was provided an opportunity to ask questions and all were answered. The patient agreed with the plan and demonstrated an understanding of the instructions.   The patient was advised to call back or seek an in-person evaluation if the symptoms worsen or if  the condition fails to improve as anticipated.     Cameron Sprang, MD

## 2019-11-05 ENCOUNTER — Telehealth (INDEPENDENT_AMBULATORY_CARE_PROVIDER_SITE_OTHER): Payer: Medicare Other | Admitting: Neurology

## 2019-11-05 ENCOUNTER — Encounter: Payer: Self-pay | Admitting: Neurology

## 2019-11-05 ENCOUNTER — Other Ambulatory Visit: Payer: Self-pay

## 2019-11-05 VITALS — BP 140/83

## 2019-11-05 DIAGNOSIS — G40009 Localization-related (focal) (partial) idiopathic epilepsy and epileptic syndromes with seizures of localized onset, not intractable, without status epilepticus: Secondary | ICD-10-CM | POA: Diagnosis not present

## 2019-11-05 MED ORDER — LAMOTRIGINE 100 MG PO TABS: ORAL_TABLET | ORAL | 3 refills | Status: DC

## 2019-11-05 NOTE — Progress Notes (Signed)
Virtual Visit via Video Note The purpose of this virtual visit is to provide medical care while limiting exposure to the novel coronavirus.    Consent was obtained for video visit:  Yes.   Answered questions that patient had about telehealth interaction:  Yes.   I discussed the limitations, risks, security and privacy concerns of performing an evaluation and management service by telemedicine. I also discussed with the patient that there may be a patient responsible charge related to this service. The patient expressed understanding and agreed to proceed.  Pt location: Home Physician Location: office Name of referring provider:  Azell Der, MD I connected with Patrick Gregory at patients initiation/request on 11/05/2019 at  4:00 PM EDT by video enabled telemedicine application and verified that I am speaking with the correct person using two identifiers. Pt MRN:  [id number] Pt DOB:  [birth year] Video Participants:  Patrick Gregory;  Jonelle Sidle (daughter); significant other Stanton Kidney was on speakerphone   History of Present Illness:  The patient had a virtual video visit on 11/05/2019. He was last seen 8 months ago for seizures. He and his family deny any further seizures since June 2020 when he felt sick to his stomach, vomited, then fell to the floor with loss of consciousness. No convulsive activity but hand twitched. He has been on Lamotrigine 150mg  BID since then with no further seizures. Tiffany reports that when he ran out of one of his psychiatric medications for 3 weeks, his thinking and balance were "100% good," but since back on the medication, he is back to not thinking right with balance off. He fell down the steps a couple of days ago, he felt woozy and everything was spinning, no loss of consciousness. He had a real bad headache on the right side the other day, there were bumps that he scratched off and "kept coming back." Sleep is better with his CPAP. His BP has been better, 140/83 today. Tiffany  reports that when he gets into a coughing or sneezing fit, she tries to make sure he is fine because this led to a seizure one time. She reports he forgets a lot. He has significant pain in his feet due to gout, he feels like he is standing on a skillet.   History on Initial Assessment 03/03/2016: This is a pleasant 59 yo RH man with a history of COPD, chronic neck and back pain, PTSD, who presented for seizures. He reports that the first seizure occurred 3-4 years ago, he woke up on the floor then 2-3 hours later passed out again. He denied any prior warning symptoms. He was started on an unrecalled seizure medication which he took for a year then self-discontinued because he had been seizure-free.  He was event-free until January 2017 when he had 3 episodes in a week. The first episode he was sitting on the couch when his head went back and he became unresponsive for a few seconds, eyes were closed. The next day, he was working on something on the wall when his uncle noticed he looked lost. He called to him and he snapped back. The third episode occurred 2 days later, again while sitting on the couch, his head went back with eyes closed, then he had mild jerking of both arms for a few seconds. No tongue bite or incontinence. When he came to, he told his fiancee he had a headache and felt nauseated. He had another mild episode 3 weeks ago. His is amnestic of these episodes, his  whole body feels weak after, no focal weakness. He and his fiancee deny any staring/unresponsive episodes, no gaps in time. He denies any  olfactory/gustatory hallucinations, deja vu, rising epigastric sensation, myoclonic jerks. When he turns his head to the right, he has numbness over the left arm. He reports having degenerative disc disease and sees neurosurgery. He was started on Keppra 500mg  and denies any side effects since taking it the past 3 days. He was in the ER last 02/25/16 for gait difficulty, he was staggering, walking like was  drunk. He was evaluated by Neurology at [facility], no focal weakness seen, finger to nose testing appeared functional. Symptoms lasted 2-3 days. He had not started Keppra when these symptoms began. He had an MRI brain with and without contrast which I personally reviewed, there were no acute changes, hippocampi symmetric with no abnormal signal or enhancement seen. He had a head CT the next day with no acute changes. The gait instability has since resolved.  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 He has had several head injuries with loss of consciousness while he was in [employer], otherwise he had a normal birth and early development.  There is no history of febrile convulsions, CNS infections such as meningitis/encephalitis, neurosurgical procedures, or family history of seizures.  He was also reporting memory loss and underwent Neuropsychological evaluation with a diagnosis of Bipolar disorder, current episode depressed; PTSD (by history). Per report, "Results of cognitive testing could not be interpreted due to evidence of sub-optimal effort on formal testing. A finding of sub-optimal effort in combination with severely impaired performances across all aspects of testing is commonly seen in individuals with somatoform disorders such as PNES. Additionally, he presents with significant emotional distress and relatively severe psychopathology including bipolar disorder (currently depressed with suicidal ideation) and PTSD; primary psychiatric disorders such as these are often present in individuals with PNES as well. Regardless of whether or not he has PNES, it is clear that his psychiatric disorders are significantly and negatively affecting his daily functioning and likely his cognitive functioning to at least some degree. Ongoing and intensive mental health treatment is certainly indicated,  including ongoing risk assessment and crisis intervention as indicated." He continues to see a psychiatrist and a therapist every month.  Update 07/30/2018: He has had repeated hospital visits for left-sided weakness, he was at [facility] in May 2019 for left-sided weakness, dropping things from his left hand, slurred speech, and gibberish speech. He had a left-sideed facial droop per wife. MRI brain, echo, carotid dopplers were normal. Concern for polypharmacy was raised. He was in the ER again in September 2019 for left-sided weakness, slurred speech, and difficulty walking. He was also reporting visual hallucinations (believing a man was in the house). Symptoms were resolving upon arrival to the ER. He was evaluated by Tele-neurology with no weakness appreciated. MRI brain no acute changes, CTA unrevealing. He was evaluated by [facility] and outpatient psychiatry follow-up was recommended. His wife reports that they were told he had a mini-stroke and that he was overmedicated. He has decreased clonazepam dose to 2.5 tablets daily through his [place] psychiatrist. He has good and bad days, sometimes he is more tired and sleepy and would tend to bump into things with worse balance. When he is more energized, he does good with his balance. He had a fall last week  without injuries.   Diagnostic Data: MRI brain done at [facility] in May and September 2019 reported as normal 48-hour EEG done 06/2016 showed occasional focal slowing over the left temporal region, no epileptiform discharges. Typical events were not captured.   Current Outpatient Medications on File Prior to Visit  Medication Sig Dispense Refill  . albuterol (PROVENTIL HFA;VENTOLIN HFA) 108 (90 Base) MCG/ACT inhaler Inhale 2 puffs every 6 (six) hours as needed into the lungs for wheezing or shortness of breath. 3 Inhaler 2  . atorvastatin (LIPITOR) 20 MG tablet Take by mouth.    . benztropine (COGENTIN) 1 MG tablet Take by mouth.    .  cyclobenzaprine (FLEXERIL) 10 MG tablet Take 1 tablet (10 mg total) by mouth 3 (three) times daily. 90 tablet 0  . lamoTRIgine (LAMICTAL) 100 MG tablet Take 1.5 tablets twice a day 270 tablet 3  . Lurasidone HCl 60 MG TABS Take 80 mg by mouth at bedtime.     Marland Kitchen omeprazole (PRILOSEC) 20 MG capsule     . PARoxetine (PAXIL) 30 MG tablet Take by mouth.     . pregabalin (LYRICA) 75 MG capsule Take 1 capsule (75 mg total) by mouth 2 (two) times daily. 60 capsule 0  . traZODone (DESYREL) 150 MG tablet Take 150 mg by mouth at bedtime as needed.     . venlafaxine XR (EFFEXOR-XR) 75 MG 24 hr capsule Take 3 capsules by mouth every morning.     No current facility-administered medications on file prior to visit.     Observations/Objective:   Vitals:   11/05/19 1629  BP: 140/83   GEN:  The patient appears stated age and is in NAD.  Neurological examination: Patient is awake, alert, oriented x 3. No aphasia or dysarthria. Intact fluency and comprehension. Remote and recent memory impaired. Cranial nerves: Extraocular movements intact with no nystagmus. No facial asymmetry. Motor: moves all extremities symmetrically, at least anti-gravity x 4. No incoordination on finger to nose testing. Gait: not tested due to pain from gout   Assessment and Plan:    This is a pleasant 59 yo RH man with a history of COPD, chronic neck and back pain, PTSD, with a history of seizures since 2013/2014. His EEG showed occasional left temporal slowing, no epileptiform discharges. MRI brain unremarkable. 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 Last seizure June 2020, he is on Lamotrigine 150mg  BID without side effects. Discuss side effects of one of psychiatric medications (cognitive, balance) with his psychiatrist. Continue daily aspirin and control of vascular risk factors. He is aware of [place] driving laws to stop driving after a seizure,  until 6 months seizure-free. Follow-up in 6 months, he knows to call for any changes.    Follow Up Instructions:   -I discussed the assessment and treatment plan with the patient. The patient was provided an opportunity to ask questions and all were answered. The patient agreed with the plan and demonstrated an understanding of the instructions.   The patient was advised to call back or seek an in-person evaluation if the symptoms worsen or if the condition fails to improve as anticipated.    Cameron Sprang, MD

## 2020-05-28 ENCOUNTER — Telehealth: Payer: Self-pay | Admitting: Neurology

## 2020-05-28 NOTE — Telephone Encounter (Signed)
Patient's wife Stanton Kidney called and scheduled follow up from a recent ED visit on 05/18/20 for "altered state of consciousness" with Dr. Delice Lesch on 08/13/2020. She'd like to be sure this appointment is soon enough based on Dr. Amparo Bristol review of the ED notes.

## 2020-06-19 NOTE — Telephone Encounter (Signed)
[facility] for 1/13 appt. If any changes before that, put on waitlist. Thanks

## 2020-08-13 ENCOUNTER — Other Ambulatory Visit: Payer: Self-pay

## 2020-08-13 ENCOUNTER — Ambulatory Visit (INDEPENDENT_AMBULATORY_CARE_PROVIDER_SITE_OTHER): Payer: Medicare Other | Admitting: Neurology

## 2020-08-13 ENCOUNTER — Encounter: Payer: Self-pay | Admitting: Neurology

## 2020-08-13 VITALS — BP 137/78 | HR 113 | Resp 20 | Ht 69.0 in | Wt 256.0 lb

## 2020-08-13 DIAGNOSIS — R296 Repeated falls: Secondary | ICD-10-CM | POA: Diagnosis not present

## 2020-08-13 DIAGNOSIS — R413 Other amnesia: Secondary | ICD-10-CM

## 2020-08-13 DIAGNOSIS — G40009 Localization-related (focal) (partial) idiopathic epilepsy and epileptic syndromes with seizures of localized onset, not intractable, without status epilepticus: Secondary | ICD-10-CM

## 2020-08-13 DIAGNOSIS — W19XXXA Unspecified fall, initial encounter: Secondary | ICD-10-CM

## 2020-08-13 MED ORDER — LAMOTRIGINE 100 MG PO TABS: ORAL_TABLET | ORAL | 3 refills | Status: AC

## 2020-08-13 NOTE — Patient Instructions (Signed)
1. Continue Lamotrigine 150mg  twice a day  2. Schedule repeat Neuropsychological evaluation  3. Refer for home physical therapy for balance therapy  4. Recommend using a walker at all times  5. Follow-up in 6 months, call for any changes   You have been referred for a neuropsychological evaluation (i.e., evaluation of memory and thinking abilities). Please bring someone with you to this appointment if possible, as it is helpful for the doctor to hear from both you and another adult who knows you well. Please bring eyeglasses and hearing aids if you wear them.    The evaluation will take approximately 3 hours and has two parts:   . The first part is a clinical interview with the neuropsychologist (Dr. Melvyn Novas or Dr. Nicole Kindred). During the interview, the neuropsychologist will speak with you and the individual you brought to the appointment.    . The second part of the evaluation is testing with the doctor's technician Hinton Dyer or Maudie Mercury). During the testing, the technician will ask you to remember different types of material, solve problems, and answer some questionnaires. Your family member will not be present for this portion of the evaluation.   Please note: We must reserve several hours of the neuropsychologist's time and the psychometrician's time for your evaluation appointment. As such, there is a No-Show fee of $100. If you are unable to attend any of your appointments, please contact our office as soon as possible to reschedule.

## 2020-08-13 NOTE — Progress Notes (Signed)
NEUROLOGY FOLLOW UP OFFICE NOTE  Patrick Gregory [id number] 14-Feb-1961  HISTORY OF PRESENT ILLNESS: I had the pleasure of seeing Patrick Gregory in follow-up in the neurology clinic on 08/13/2020.  The patient was last seen 9 months ago for seizures. He is again accompanied by his wife who helps supplement the history today.  Records and images were personally reviewed where available.  He was in the ER on 05/18/2020 after a fall with left arm tingling. When EMS arrived, he was inconsistently following commands. He was drowsy, complaining of chest pain and left face/arm tingling. He has had recurrent falls and was in the ER in July 2021. He has leg pain and gout. He was evaluated by Neurology at [facility] and tingling was felt due to known arthritis. CT head no acute changes. CT cervical spine showed multilevel cervical spondylosis most prominent at C6-7 with moderate right neural foraminal stenosis, posterior disc osteophyte complex at C6-7 at least mild spinal canal stenosis. Polypharmacy and poorly controlled sleep apnea were again mentioned as potential cause of drowsiness. He was in the ER yesterday for COPD exacerbation, he has been coughing for several days and passed out last night during a coughing fit. His O2 levels were low at home, normal in the ER. Viral testing in the ER was negative for COVID, flu, RSV.  He is on Lamotrigine 150mg  BID without side effects. He and his wife deny any typical seizures since June 2020. Last night he got out of bed and started coughing and shaking. While shaking he was responding, she told him to breathe and he said he was breathing. His wife reports he was half asleep. He has had a significant amount of falls. One month he had 10 falls, since then, family is always on stand by assist with showers and ambulation. He refuses to use assistive devices. He has had falls rolling out of bed so they now have a bed rail. Some falls occur as he stands and loses balance,  even with help around him. He misjudges distances. He has leg and back pain, as well as gout pain. There is more pain when he bends down. His memory has gotten tremendously worse. He is having more word-finding difficulties and his wife has to decipher what he is saying with a lot of questions. Sometimes he wakes up fine, then 30-60 minutes later he is confused. His wife reports he had an MRA through the [place] which initially raised concern about a "small blood clot in the back of the head," but follow-up CT head did not show any abnormalities. He has talked to his psychiatrist and one of his medications has been decreased. He needs a new CPAP mask, his current one has poor seal. They are awaiting word from the [place].    History on Initial Assessment 03/03/2016: This is a pleasant 60 yo RH man with a history of COPD, chronic neck and back pain, PTSD, who presented for seizures. He reports that the first seizure occurred 3-4 years ago, he woke up on the floor then 2-3 hours later passed out again. He denied any prior warning symptoms. He was started on an unrecalled seizure medication which he took for a year then self-discontinued because he had been seizure-free.  He was event-free until January 2017 when he had 3 episodes in a week. The first episode he was sitting on the couch when his head went back and he became unresponsive for a few seconds, eyes were closed. The next  day, he was working on something on the wall when his uncle noticed he looked lost. He called to him and he snapped back. The third episode occurred 2 days later, again while sitting on the couch, his head went back with eyes closed, then he had mild jerking of both arms for a few seconds. No tongue bite or incontinence. When he came to, he told his fiancee he had a headache and felt nauseated. He had another mild episode 3 weeks ago. His is amnestic of these episodes, his whole body feels weak after, no focal weakness. He and his fiancee deny any  staring/unresponsive episodes, no gaps in time. He denies any  olfactory/gustatory hallucinations, deja vu, rising epigastric sensation, myoclonic jerks. When he turns his head to the right, he has numbness over the left arm. He reports having degenerative disc disease and sees neurosurgery. He was started on Keppra 500mg  and denies any side effects since taking it the past 3 days. He was in the ER last 02/25/16 for gait difficulty, he was staggering, walking like was drunk. He was evaluated by Neurology at [facility], no focal weakness seen, finger to nose testing appeared functional. Symptoms lasted 2-3 days. He had not started Keppra when these symptoms began. He had an MRI brain with and without contrast which I personally reviewed, there were no acute changes, hippocampi symmetric with no abnormal signal or enhancement seen. He had a head CT the next day with no acute changes. The gait instability has since resolved.  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 He has had several head injuries with loss of consciousness while he was in [employer], otherwise he had a normal birth and early development.  There is no history of febrile convulsions, CNS infections such as meningitis/encephalitis, neurosurgical procedures, or family history of seizures.  He was also reporting memory loss and underwent Neuropsychological evaluation with a diagnosis of Bipolar disorder, current episode depressed; PTSD (by history). Per report, "Results of cognitive testing could not be interpreted due to evidence of sub-optimal effort on formal testing. A finding of sub-optimal effort in combination with severely impaired performances across all aspects of testing is commonly seen in individuals with somatoform disorders such as PNES. Additionally, he presents with significant emotional distress and relatively severe  psychopathology including bipolar disorder (currently depressed with suicidal ideation) and PTSD; primary psychiatric disorders such as these are often present in individuals with PNES as well. Regardless of whether or not he has PNES, it is clear that his psychiatric disorders are significantly and negatively affecting his daily functioning and likely his cognitive functioning to at least some degree. Ongoing and intensive mental health treatment is certainly indicated, including ongoing risk assessment and crisis intervention as indicated." He continues to see a psychiatrist and a therapist every month.  Update 07/30/2018: He has had repeated hospital visits for left-sided weakness, he was at [facility] in May 2019 for left-sided weakness, dropping things from his left hand, slurred speech, and gibberish speech. He had a left-sideed facial droop per wife. MRI brain, echo, carotid dopplers were normal. Concern for polypharmacy was raised. He was in the ER again in September 2019 for left-sided weakness, slurred speech, and difficulty walking. He was also reporting visual hallucinations (believing a man was in the house). Symptoms were resolving upon arrival to the ER. He was evaluated by Tele-neurology with  no weakness appreciated. MRI brain no acute changes, CTA unrevealing. He was evaluated by [facility] and outpatient psychiatry follow-up was recommended. His wife reports that they were told he had a mini-stroke and that he was overmedicated. He has decreased clonazepam dose to 2.5 tablets daily through his [place] psychiatrist. He has good and bad days, sometimes he is more tired and sleepy and would tend to bump into things with worse balance. When he is more energized, he does good with his balance. He had a fall last week without injuries.   Diagnostic Data: MRI brain done at [facility] in May and September 2019 reported as normal 48-hour EEG done 06/2016 showed occasional focal slowing over the  left temporal region, no epileptiform discharges. Typical events were not captured. Neuropsychological evaluation by Dr. Si Raider in 2018: Diagnosis of Bipolar disorder, current episode depressed; PTSD (by history). There was note of suboptimal effort so results could not be interpreted. It is clear that his psychiatric disorders are significantly and negatively affecting his daily functioning and likely his cognitive functioning to at least some degree.   PAST MEDICAL HISTORY: Past Medical History:  Diagnosis Date  . Allergic rhinitis   . Arthritis    shoulders, back  . Asthma   . Bipolar disorder ([place])   . Chickenpox   . COPD (chronic obstructive pulmonary disease) ([place])   . Depression   . Gout   . Low back pain   . Seizures ([place])    last one approx 18 mos ago.  No meds approx 1 yr.  . Sleep apnea    supposed to use CPAP. Does not have.  . Stiff neck    limited side to side turn.  Left side pain when looking up.  . Throat cancer [facility])     MEDICATIONS: Current Outpatient Medications on File Prior to Visit  Medication Sig Dispense Refill  . albuterol (PROVENTIL HFA;VENTOLIN HFA) 108 (90 Base) MCG/ACT inhaler Inhale 2 puffs every 6 (six) hours as needed into the lungs for wheezing or shortness of breath. 3 Inhaler 2  . atorvastatin (LIPITOR) 20 MG tablet Take by mouth.    . benztropine (COGENTIN) 1 MG tablet Take by mouth.    . cyclobenzaprine (FLEXERIL) 10 MG tablet Take 1 tablet (10 mg total) by mouth 3 (three) times daily. 90 tablet 0  . lamoTRIgine (LAMICTAL) 100 MG tablet Take 1.5 tablets twice a day 270 tablet 3  . Lurasidone HCl 60 MG TABS Take 80 mg by mouth at bedtime.     Marland Kitchen omeprazole (PRILOSEC) 20 MG capsule     . PARoxetine (PAXIL) 30 MG tablet Take by mouth.     . pregabalin (LYRICA) 75 MG capsule Take 1 capsule (75 mg total) by mouth 2 (two) times daily. 60 capsule 0  . traZODone (DESYREL) 150 MG tablet Take 150 mg by mouth at bedtime as needed.     . venlafaxine XR  (EFFEXOR-XR) 75 MG 24 hr capsule Take 3 capsules by mouth every morning.     No current facility-administered medications on file prior to visit.    ALLERGIES: Allergies  Allergen Reactions  . Duloxetine Hcl Other (See Comments)    Worsened RLS  . Quetiapine Fumarate Other (See Comments)    "Half asleep, Half awake"    FAMILY HISTORY: Family History  Problem Relation Age of Onset  . Alcoholism Other   . Arthritis Other   . Hyperlipidemia Other   . Heart disease Other   . Sudden death  Other   . Kidney disease Other   . Mental illness Other   . Depression Mother   . Bipolar disorder Mother   . Depression Brother   . Bipolar disorder Brother   . Heart disease Brother   . Heart disease Brother     SOCIAL HISTORY: Social History   Socioeconomic History  . Marital status: Married    Spouse name: Not on file  . Number of children: Not on file  . Years of education: Not on file  . Highest education level: Not on file  Occupational History  . Occupation: retired    Comment: Nature conservation officer  Tobacco Use  . Smoking status: Current Every Day Smoker    Packs/day: 1.00    Years: 35.00    Pack years: 35.00    Types: Cigarettes    Last attempt to quit: 05/01/2014    Years since quitting: 6.2  . Smokeless tobacco: Never Used  Vaping Use  . Vaping Use: Never used  Substance and Sexual Activity  . Alcohol use: Yes  . Drug use: No  . Sexual activity: Yes    Partners: Female  Other Topics Concern  . Not on file  Social History Narrative    Right handed       Lives with wife and daughter in two story home      GED            Social Determinants of Health   Financial Resource Strain: Not on file  Food Insecurity: Not on file  Transportation Needs: Not on file  Physical Activity: Not on file  Stress: Not on file  Social Connections: Not on file  Intimate Partner Violence: Not on file     PHYSICAL EXAM: Vitals:   08/13/20 1008  BP: 137/78  Pulse: (!) 113  Resp:  20  SpO2: 98%   General: No acute distress Head:  Normocephalic/atraumatic Skin/Extremities: No rash, no edema Neurological Exam: awake and alert, answers questions and follows commands. No aphasia or dysarthria. Fund of knowledge is appropriate.  Recent and remote memory are impaired. Attention and concentration are reduced. Cranial nerves: Pupils equal, round. Extraocular movements intact with no nystagmus. Visual fields full.  No facial asymmetry.  Motor: Bulk and tone normal, no cogwheeling, muscle strength 5/5 throughout with no pronator drift.  Reflexes +2 throughout, negative Hoffman sign. Finger to nose testing intact.  Gait slow and cautious, no ataxia. No resting tremor, he has bilateral postural and endpoint hand tremors.    IMPRESSION: This is a pleasant 60 yo RH man with a history of COPD, chronic neck and back pain, PTSD, with a history of seizures since 2013/2014. His EEG showed occasional left temporal slowing, no epileptiform discharges. MRI brain unremarkable. 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 His last typical seizure was in June 2020, continue Lamotrigine 150mg  BID. He has had a significant increase in falls, as well as ER visits for altered mental status, felt related to polypharmacy and poorly controlled sleep apnea. Continue working with psychiatry and the [place] for new CPAP mask. Discussed that falls are multifactorial, he has spinal stenosis, back and leg pain, and gout. He is agreeable to home balance therapy, advised to use walker at this point. His wife is also reporting worsening cognitive difficulties. His prior Neuropsychological evaluation in 2018 was nondiagnostic due to suboptimal effort, but it was noted that his psychiatric disorders are significantly and negatively  affecting his daily functioning and likely his cognitive functioning. Repeat brain imaging has been unremarkable,  repeat Neuropsychological evaluation will be ordered. Continue close supervision, he does not drive. Follow-up in 6 months, they know to call for any changes.    Thank you for allowing me to participate in his care.  Please do not hesitate to call for any questions or concerns.   Ellouise Newer, M.D.   CC: Dr. Martina Sinner

## 2020-09-17 ENCOUNTER — Encounter: Payer: Medicare Other | Admitting: Psychology

## 2020-09-24 ENCOUNTER — Encounter: Payer: Medicare Other | Admitting: Psychology

## 2020-10-22 ENCOUNTER — Ambulatory Visit (INDEPENDENT_AMBULATORY_CARE_PROVIDER_SITE_OTHER): Payer: Medicare Other | Admitting: Psychology

## 2020-10-22 ENCOUNTER — Encounter: Payer: Self-pay | Admitting: Psychology

## 2020-10-22 ENCOUNTER — Ambulatory Visit: Payer: Medicare Other | Admitting: Psychology

## 2020-10-22 ENCOUNTER — Telehealth: Payer: Self-pay | Admitting: Neurology

## 2020-10-22 ENCOUNTER — Other Ambulatory Visit: Payer: Self-pay

## 2020-10-22 DIAGNOSIS — F411 Generalized anxiety disorder: Secondary | ICD-10-CM

## 2020-10-22 DIAGNOSIS — R4189 Other symptoms and signs involving cognitive functions and awareness: Secondary | ICD-10-CM

## 2020-10-22 DIAGNOSIS — F319 Bipolar disorder, unspecified: Secondary | ICD-10-CM

## 2020-10-22 DIAGNOSIS — J45909 Unspecified asthma, uncomplicated: Secondary | ICD-10-CM | POA: Insufficient documentation

## 2020-10-22 DIAGNOSIS — G4733 Obstructive sleep apnea (adult) (pediatric): Secondary | ICD-10-CM

## 2020-10-22 DIAGNOSIS — F329 Major depressive disorder, single episode, unspecified: Secondary | ICD-10-CM | POA: Insufficient documentation

## 2020-10-22 DIAGNOSIS — F331 Major depressive disorder, recurrent, moderate: Secondary | ICD-10-CM

## 2020-10-22 DIAGNOSIS — M109 Gout, unspecified: Secondary | ICD-10-CM | POA: Insufficient documentation

## 2020-10-22 DIAGNOSIS — F4310 Post-traumatic stress disorder, unspecified: Secondary | ICD-10-CM

## 2020-10-22 NOTE — Telephone Encounter (Signed)
Patient came into the office for neuropsychological testing today.   During the check-in process, his wife mentioned that on the way into the visit the patient had an incident of feeling dizzy then started shaking in his hands and legs. She said this has been going on for the last 2-3 months and she wants Dr. Amparo Bristol advice.

## 2020-10-22 NOTE — Progress Notes (Signed)
   Psychometrician Note   Cognitive testing was administered to Patrick Gregory. by Milana Kidney, B.S. (psychometrist) under the supervision of Dr. Christia Reading, Ph.D., licensed psychologist on 10/22/20. Patrick Gregory did not appear overtly distressed by the testing session per behavioral observation or responses across self-report questionnaires. Rest breaks were offered.    The battery of tests administered was selected by Dr. Christia Reading, Ph.D. with consideration to Patrick Gregory's current level of functioning, the nature of his symptoms, emotional and behavioral responses during interview, level of literacy, observed level of motivation/effort, and the nature of the referral question. This battery was communicated to the psychometrist. Communication between Dr. Christia Reading, Ph.D. and the psychometrist was ongoing throughout the evaluation and Dr. Christia Reading, Ph.D. was immediately accessible at all times. Dr. Christia Reading, Ph.D. provided supervision to the psychometrist on the date of this service to the extent necessary to assure the quality of all services provided.    Patrick Cloud. will return within approximately 1-2 weeks for an interactive feedback session with Dr. Melvyn Novas at which time his test performances, clinical impressions, and treatment recommendations will be reviewed in detail. Patrick Gregory understands he can contact our office should he require our assistance before this time.  A total of 160 minutes of billable time were spent face-to-face with Patrick Gregory by the psychometrist. This includes both test administration and scoring time. Billing for these services is reflected in the clinical report generated by Dr. Christia Reading, Ph.D.  This note reflects time spent with the psychometrician and does not include test scores or any clinical interpretations made by Dr. Melvyn Novas. The full report will follow in a separate note.

## 2020-10-22 NOTE — Progress Notes (Addendum)
NEUROPSYCHOLOGICAL EVALUATION [place] Department of Neurology  Date of Evaluation: October 22, 2020  Reason for Referral:   Taron Mondor. is a 60 y.o. right-handed Hispanic male referred by Ellouise Newer, M.D., to characterize his current cognitive functioning and assist with diagnostic clarity and treatment planning in the context of subjective cognitive decline, past seizure history, and numerous medical and psychiatric comorbidities.   Assessment and Plan:   Clinical Impression(s): Unfortunately, scores across stand-alone and embedded performance validity measures were very frequently below expectation. As such, obtained test scores are viewed as invalid and should not be interpreted at face value. With that being said, scores across validity indicators were not below chance as they were during his previous 2018 evaluation. As such, there is not evidence for malingering. It is far more likely that the significance of his psychiatric distress, chronic pain, and fatigue prevented his ability to fully engage in testing. Behavioral observations were inconsistent at times. For example, condition two of the D-KEFS Color-Word test was discontinued due to Mr. Mealey stating that he was unable to read the words printed on the page. However, he had no trouble reading words on conditions three or four despite them being presented in the same size and orientation. He also reported no trouble viewing pegboard placements when using his dominant (right) hand; however, when asked prior to him starting with his left hand, he reported significant visual difficulties which prevented him from participating. Performances across certain tests, such as clock drawing (see Test Results section), were also implausibly poor given his age and unremarkable neuroimaging. He was also noted to give up prematurely on several tasks. For example, during [employer] he would consistently be quite  close to solving the problem, only to then discontinue stating "my memory is really bad" despite that test not assessing memory. These testing and behavioral observations are odd and unexplainable via neurological theories and further add to concerns surrounding testing validity.   While direct test interpretation is inappropriate, it is important to highlight that Mr. Overholser does have several factors which have the potential to impact cognitive functioning in his day-to-day life. Across mood-related questionnaires, he reported severe symptoms of PTSD, as well as moderate symptoms of acute anxiety and depression. Psychiatric distress is well known to compromise cognitive functioning and would be expected to affect domains of processing speed, attention/concentration, executive functioning, and learning and memory. Additionally, research has suggested that individuals with bipolar disorder can have residual executive dysfunction and visuospatial deficits even when psychiatric symptoms are managed relatively well. In fact, he and his wife's report of him losing large chunks of more long-term memory, as well as being unable to remember and write his name are very odd memory complaints that are typically not seen in individuals even with established mild dementia presentations. These complaints are very consistent with significant psychiatric distress rather than something neurological in nature. In addition to psychiatric concerns, chronic and diffuse pain, poorly managed obstructive sleep apnea, various cardiovascular and other medical ailments, and ongoing fatigue are also known to impact cognitive functioning, affecting similar domains as described above. Overall, it is likely that a combination of these factors, with the largest contributor being ongoing psychiatric distress, is the primary cause for subjective day-to-day cognitive dysfunction. While I cannot rule out an underlying neurological disease, I certainly  cannot rule one in either due to the interference of psychiatric and other medical symptoms described above.   Report of seizure activity could further  impact cognitive functioning. However, as prior EEG's have not shown any epileptiform activity and prior head CTs and brain MRIs have been unremarkable, it seems far less likely that there is a true underlying neurological condition. Psychogenic nonepileptic seizures (PNES) are common in individuals with significant psychiatric distress, especially those with severe symptoms of PTSD. It could be that a PNES presentation best represents reported seizure activity. However, this remains unclear at the present time. Continued medical monitoring will be important moving forward.  Recommendations: A combination of medication and psychotherapy has been shown to be most effective at treating symptoms of anxiety and depression. As such, Mr. Blissett is encouraged to speak with his prescribing physician regarding medication adjustments to optimally manage these symptoms.   Likewise, Mr. Mansfield is strongly encouraged to engage in individual psychotherapy to address symptoms of psychiatric distress. When addressing PTSD, he would benefit from a specialized provider who can provide either Cognitive Processing Therapy (CPT) or Trauma-Focused Cognitive Behavioral Therapy (CBT). I believe that serious attention and management of mood-related factors will be most important in improving his current clinical presentation.   He reported that he will commonly take off his CPAP mask in the middle of the night, likely leading to his obstructive sleep apnea being poorly managed at the present time. Untreated or poorly managed sleep apnea will certainly create and worsen cognitive decline. It will also increase his risk for stroke, heart attack, and future cognitive decline. As such, utilizing his CPAP machine and consistently wearing it throughout the night will be vital moving forward.    Mr. Votaw is encouraged to attend to lifestyle factors for brain health (e.g., regular physical exercise, good nutrition habits, regular participation in cognitively-stimulating activities, and general stress management techniques), which are likely to have benefits for both emotional adjustment and cognition. In fact, in addition to promoting good general health, regular exercise incorporating aerobic activities (e.g., brisk walking, jogging, cycling, etc.) has been demonstrated to be a very effective treatment for depression and stress, with similar efficacy rates to both antidepressant medication and psychotherapy.  When learning new information, he would benefit from information being broken up into small, manageable pieces. He may also find it helpful to articulate the material in his own words and in a context to promote encoding at the onset of a new task. This material may need to be repeated multiple times to promote encoding.  Memory can be improved using internal strategies such as rehearsal, repetition, chunking, mnemonics, association, and imagery. External strategies such as written notes in a consistently used memory journal, visual and nonverbal auditory cues such as a calendar on the refrigerator or appointments with alarm, such as on a cell phone, can also help maximize recall.    To address problems with processing speed, he may wish to consider:   -Ensuring that he is alerted when essential material or instructions are being presented   -Adjusting the speed at which new information is presented   -Allowing for more time in comprehending, processing, and responding in conversation  To address problems with fluctuating attention, he may wish to consider:   -Avoiding external distractions when needing to concentrate   -Limiting exposure to fast paced environments with multiple sensory demands   -Writing down complicated information and using checklists   -Attempting and completing  one task at a time (i.e., no multi-tasking)   -Verbalizing aloud each step of a task to maintain focus   -Reducing the amount of information considered at one time  Review  of Records:   Mr. Urbas completed a comprehensive neuropsychological evaluation Kandis Nab, Psy.D.) on 06/13/2017. Unfortunately, embedded performance validity indicators revealed suboptimal levels of effort and the patient demonstrated poor (below-chance) performances on a test of memory malingering. As such, Mr. Uno' performance on neurocognitive testing could not be validly interpreted. On a self-report measure of depressive symptoms, he endorsed a severe level of depression, including passive suicidal ideation. On a more extensive measure of psychopathology and personality function, there were abnormalities on symptom validity indicators, also limiting the interpretation/validity of this test. Response patterns were unusual in that they indicated a defensiveness about particular personal shortcomings as well as an exaggeration of certain problems. Although his pattern of responding did not indicate a level of distortion that would render test results invalid, the interpretive hypotheses presented in this report must be considered with caution and with these considerations in mind. When taken at face value, his profile pattern is usually associated with marked distress and, unless there is extensive distortion or exaggeration of symptomatology, severe impairment in functioning is typically present. The configuration of the clinical scales suggested a person with significant tension, unhappiness, and pessimism. Although Mr. Bochicchio was quite distressed and acutely aware of his need for help, his low energy level, tension, and withdrawal may make him difficult to engage in treatment. Various stressors (both past and present) have adversely affected his self-esteem and he viewed himself as ineffectual and powerless to change the  direction of his life. These disruptions in his life have left him uncertain about his goals and priorities and tense and pessimistic about what the future may hold. Ultimately, clinical impressions focused on bipolar disorder (current episode depressed), as well as PTSD.    Mr. Pickrell was referred for a repeat evaluation on 04/17/2018. However, per Dr. Si Raider, symptoms and overall presentation were quite stable. Given no report of significant chance in cognitive or psychological functioning, as well as no clear interim neurological event or change in neuroimaging, repeat testing was not performed.   Most recently, Mr. Braxton was seen be [facility] Ellouise Newer, M.D.) on 08/13/2020 for follow-up of seizure activity and cognitive complaints. He was recently seen in the ER on 05/18/2020 after a fall with left arm tingling. When EMS arrived, he was inconsistently following commands. He was drowsy, complaining of chest pain, and was experiencing left face/arm tingling. He has had recurrent falls and was in the ER back in July 2021. He has leg pain and gout. He was evaluated by [facility]rology at [facility] and tingling was felt to be due to known arthritis. Head CT had no acute changes. Cervical spine CT showed multilevel cervical spondylosis most prominent at C6-7 with moderate right neural foraminal stenosis and posterior disc osteophyte complex at C6-7 with at least mild spinal canal stenosis. Polypharmacy and poorly controlled sleep apnea were again mentioned as potential causes of drowsiness. He was in the ER on 08/12/2020 for a COPD exacerbation. He had been coughing for several days and passed out during a coughing fit. His O2 levels were low at home but normal in the ER. Viral testing in the ER was negative for COVID-19, flu, and RSV.  Mr. Freas is currently on Lamotrigine 150mg  BID without reported side effects. He and his wife denied any typical seizures since June 2020. Additionally, he has had a  significant amount of falls. One prior month he had a total of 10 falls; since then, his family is always on stand by assist with showers  and ambulation. He has also had falls rolling out of bed and they now have a bed rail. Some falls occur as he stands and loses balance, even with help around him. He also acknowledged misjudging distances. He has leg and back pain, as well as gout pain. There is more pain when he bends down. Memory was said to have gotten worse over time. Word finding difficulties were also reported and his wife often has to decipher what he is saying with a lot of questions. His wife reported that he had an MRA through the [place] which initially raised concern about a "small blood clot in the back of the head." Follow-up head CT did not show any abnormalities. He reported needing a new CPAP mask as his current one has poor seal. They are awaiting word from the [place]. Ultimately, Mr. Givler was referred for a comprehensive neuropsychological evaluation to characterize his cognitive abilities and to assist with diagnostic clarity and treatment planning.   Brain MRI on 02/24/2016 was unremarkable. Head CT on 02/25/2016 was negative. 48-hour EEG in 06/2016 showed occasional focal slowing over the left temporal region. No epileptiform discharges were seen; however, typical events were not said to be captured. Head CT on 12/08/2017 was negative. Brain MRI on 12/08/2017 revealed mild small vessel ischemic changes. Head CT on 04/01/2018 was negative. Brain MRI on 04/01/2018 again revealed mild small vessel ischemic changes in the cerebral white matter and pons. Head CT on 02/08/2020 was negative. Head CT on 05/18/2020 was negative.   Past Medical History:  Diagnosis Date  . Allergic rhinitis   . Arthritis    shoulders, back  . Asthma   . Bipolar disorder   . 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 Chronic cervical radicular pain (Right) (intermittent) 08/29/2016  . Chronic knee pain (Right) 08/29/2016   . Chronic low back pain (Location of Tertiary source of pain) (Bilateral) (R>L) 02/24/2016  . Chronic neck pain (Location of Primary Source of Pain) (Bilateral) (R>L) 10/08/2015  . Chronic pain syndrome 08/29/2016  . Chronic shoulder pain (Bilateral) (R>L) 08/29/2016  . Chronic upper back pain (Location of Secondary source of pain) (Bilateral) (L>R) 08/29/2016  . Chronic upper extremity pain (Right) 08/29/2016  . COPD (chronic obstructive pulmonary disease) ([place])   . DDD (degenerative disc disease), cervical 05/24/2017  . DOE (dyspnea on exertion) 02/11/2016  . Dysarthria 12/08/2017  . Elevated blood pressure reading in office without diagnosis of hypertension 11/21/2017  . Elevated C-reactive protein (CRP) 10/12/2016  . Generalized anxiety disorder 10/08/2015  . GERD (gastroesophageal reflux disease) 10/08/2015  . Gout   . History of chicken pox   . History of Helicobacter pylori infection 10/25/2017  . History of recurrent TIAs 05/03/2018  . History of throat cancer   . Left arm weakness 12/08/2017  . Localization-related idiopathic epilepsy and epileptic syndromes with seizures of localized onset, not intractable, without status epilepticus 12/02/2016  . Major depressive disorder   . Metabolic encephalopathy 2/69/4854  . Mixed hyperlipidemia 04/10/2018  . Motor aphasia determined by examination 12/08/2017  . Musculoskeletal pain 12/13/2016  . Neuropathic pain 12/13/2016  . Obstructive sleep apnea 08/29/2016   Uses CPAP machine but commonly takes off during the night  . Osteoarthritis of knee (Right) 08/29/2016  . Primary osteoarthritis involving multiple joints 10/15/2018  . PTSD (post-traumatic stress disorder) 03/04/2016  . Right foot pain 10/15/2018  . Rotator cuff syndrome (Right) 08/29/2016  . Sinusitis 10/05/2016  . 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 Stiff neck  limited side to side turn. Left side pain when looking up.  . Tobacco use 10/23/2017  . Viral URI 08/09/2016  . Vitamin D  deficiency 10/12/2016    Past Surgical History:  Procedure Laterality Date  . DIRECT LARYNGOSCOPY Right 10/23/2015   Procedure: MICO DIRECT LARYNGOSCOPY WITH EXCISION RIGHT VOCAL CORD LESION;  Surgeon: Beverly Gust, MD;  Location: [place];  Service: ENT;  Laterality: Right;  . ROTATOR CUFF REPAIR Bilateral 2005, 2013  . THROAT SURGERY      Current Outpatient Medications:  .  albuterol (PROVENTIL HFA;VENTOLIN HFA) 108 (90 Base) MCG/ACT inhaler, Inhale 2 puffs every 6 (six) hours as needed into the lungs for wheezing or shortness of breath., Disp: 3 Inhaler, Rfl: 2 .  allopurinol (ZYLOPRIM) 300 MG tablet, Take 300 mg by mouth daily., Disp: , Rfl:  .  amLODipine (NORVASC) 5 MG tablet, Take 5 mg by mouth daily., Disp: , Rfl:  .  aspirin EC 81 MG tablet, Take 81 mg by mouth daily. Swallow whole., Disp: , Rfl:  .  atorvastatin (LIPITOR) 20 MG tablet, Take by mouth., Disp: , Rfl:  .  benztropine (COGENTIN) 1 MG tablet, Take by mouth., Disp: , Rfl:  .  benztropine (COGENTIN) 1 MG tablet, Take 1 mg by mouth 2 (two) times daily., Disp: , Rfl:  .  cyclobenzaprine (FLEXERIL) 10 MG tablet, Take 1 tablet (10 mg total) by mouth 3 (three) times daily., Disp: 90 tablet, Rfl: 0 .  indomethacin (INDOCIN) 50 MG capsule, Take 50 mg by mouth 2 (two) times daily with a meal., Disp: , Rfl:  .  lamoTRIgine (LAMICTAL) 100 MG tablet, Take 1.5 tablets twice a day, Disp: 270 tablet, Rfl: 3 .  Lurasidone HCl 60 MG TABS, Take 80 mg by mouth at bedtime. , Disp: , Rfl:  .  omeprazole (PRILOSEC) 20 MG capsule, , Disp: , Rfl:  .  PARoxetine (PAXIL) 30 MG tablet, Take by mouth. , Disp: , Rfl:  .  pregabalin (LYRICA) 75 MG capsule, Take 1 capsule (75 mg total) by mouth 2 (two) times daily. (Patient taking differently: Take 100 mg by mouth 2 (two) times daily.), Disp: 60 capsule, Rfl: 0 .  tamsulosin (FLOMAX) 0.4 MG CAPS capsule, Take 0.4 mg by mouth., Disp: , Rfl:  .  traZODone (DESYREL) 150 MG tablet, Take 150 mg  by mouth at bedtime as needed. , Disp: , Rfl:  .  venlafaxine XR (EFFEXOR-XR) 75 MG 24 hr capsule, Take 3 capsules by mouth every morning., Disp: , Rfl:   Clinical Interview:   The following information was obtained during a clinical interview with Mr. Dieujuste and his wife prior to cognitive testing.  Cognitive Symptoms: Decreased memory: Endorsed. During the current interview, Mr. Ginsberg described a variety of memory complaints encompassing both short- and long-term memory. Regarding the former, he described trouble remembering the details of recent conversations. His wife added that she will have to repeat herself often and that Mr. Sedore might forgot what was said to him after about five minutes. Regarding the latter, he reported having trouble remembering the past year of his life, noting significant gaps. His wife also noted that he may have trouble remembering how to write his name. Overall, memory deficits were said to be present for at least the past five years and have gradually worsened over time.  Decreased attention/concentration: Endorsed. He reported trouble with sustained attention and increased distractibility. He also described being rather tangential, noting that he will complete conversations in  his head before another person is done speaking and then move the conversation along to another topic unrelated to what the speaker was addressing.  Reduced processing speed: Endorsed. Difficulties with executive functions: Endorsed. He noted that he leaves all organizational and decisional tasks to his wife as he would have trouble with this currently. He acknowledged a longstanding history of mild impulsivity. His wife denied any overt personality changes lately.  Difficulties with emotion regulation: Denied. Difficulties with receptive language: Denied. Difficulties with word finding: Endorsed. He described his speech being pressured or becoming more rapid as he is speaking due to a desire to  get words out. He also noted a tendency to interrupt others so he can speak what he wishes to before forgetting his words.  Decreased visuoperceptual ability: Endorsed. He alluded to some trouble with depth perception and bumping into things in his environment.   Difficulties completing ADLs: Endorsed. His wife manages his medications and their finances/bill paying. They both agreed that Mr. Faciane would have trouble performing these tasks if left entirely on his own. He does not currently drive. This was reportedly due to a physician advising him to abstain given balance and speech concerns.   Additional Medical History: History of traumatic brain injury/concussion: Endorsed. 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 He noted several times where he felt dazed and disoriented after hitting his head.  History of stroke: Denied. However, medical records do suggest a history of several transient ischemic attacks (TIAs). History of seizure activity: Endorsed. Mr. Chong has a history of seizures first starting in 2013-2014. He was event free for several years until January 2017 when he had three episodes in one week. He continued to report episodes even when on Keppra 500 mg BID. He had a 48 hour EEGin November 2017 which showed occasional focal slowing over the left temporal region but no epileptiform discharges. Lamotrigine was added several years ago and he was weaned off Keppra and opiate medication. His wife reported less seizure events since being on lamotrigine and off Keppra. Notably, Mr. Bargo has a history of PTSD and bipolar disorder and PNES has been considered. During the past three months, Mr. Goeden and his wife reported "episodes" where he will remain coherent and alert but experience brief symptoms of dizziness and tremors in his arm. They were unsure if these  represent seizure activity but did note that this does not represent his typical seizure presentation (which was said to involve generalized convulsions without significant aphasia in the past).  History of known exposure to toxins: Denied. Symptoms of chronic pain: Endorsed (see medical history section). Pain symptoms were said to be diffuse in nature and often debilitating. He noted that he is no longer takes narcotic pain medications to treat these symptoms.  Experience of frequent headaches/migraines: Largely denied. Headache symptoms were said to occur 2-3 times per month Frequent instances of dizziness/vertigo: Denied. However, infrequent/occasional dizzy spells were reported to occur, especially during the past three months. The cause for this was unknown.   Sensory changes: He wears glasses and hearing aids with positive effect. He also reported a somewhat longstanding diminished sense of taste. Olfactory dysfunction was denied.   Balance/coordination difficulties: Endorsed. He reported ongoing balance instability, occasionally made worse by dizziness. He reported a history of frequent falls, including records suggesting him falling 10 times within a single month. He also noted  frequently bumping into things in his environment and showed several scars on his arms to prove this.  Other motor difficulties: Endorsed. Tremors were said to occur primarily during dizzy spells where he and his wife are unsure if he is experiencing seizure activity. Symptoms were generally denied outside of this. However, Mr. Mcanany did report decreased grip strength, particularly in his left hand, which has caused him to drop and break numerous plates or other glassware. There were also sporadic reports of generally his arms/hands exhibiting involuntary movements for brief periods of time.   Sleep History: Estimated hours obtained each night: Variable. He reported commonly going to bed between 10:00-11:00pm and then will  awake sometime between 2:00-4:00am. He wife noted that he will sometimes have a cup of coffee and then fall back asleep. However, other times, he remains awake for a while before taking daytime naps. His wife added that naps can sometimes exceed two hours.  Difficulties falling asleep: Denied. Difficulties staying asleep: Endorsed. Feels rested and refreshed upon awakening: Denied.  History of snoring: Endorsed. History of waking up gasping for air: Endorsed. Witnessed breath cessation while asleep: Endorsed. He has a history of obstructive sleep apnea and does attempt to use his CPAP machine nightly. However, he acknowledged that he very frequently knocks or takes this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

## 2020-10-22 NOTE — Telephone Encounter (Signed)
Called and spoke to patients wife and informed her of Dr. Amparo Bristol advice and recommendations. Patients wife verbalized understanding and will contact patients pcp to let them know what has been going on. Patients wife had no other questions or concerns.

## 2020-10-22 NOTE — Telephone Encounter (Signed)
If he is not losing consciousness, could be a lot of different things, including low BP, low sugar levels, pain-related. Would follow-up with PCP to let them know. Thanks

## 2020-10-29 ENCOUNTER — Ambulatory Visit (INDEPENDENT_AMBULATORY_CARE_PROVIDER_SITE_OTHER): Payer: Medicare Other | Admitting: Psychology

## 2020-10-29 ENCOUNTER — Other Ambulatory Visit: Payer: Self-pay

## 2020-10-29 DIAGNOSIS — F319 Bipolar disorder, unspecified: Secondary | ICD-10-CM

## 2020-10-29 DIAGNOSIS — F4310 Post-traumatic stress disorder, unspecified: Secondary | ICD-10-CM

## 2020-10-29 DIAGNOSIS — F331 Major depressive disorder, recurrent, moderate: Secondary | ICD-10-CM

## 2020-10-29 DIAGNOSIS — F411 Generalized anxiety disorder: Secondary | ICD-10-CM

## 2020-10-29 NOTE — Progress Notes (Signed)
   Neuropsychology Feedback Session Tillie Rung. [place] Department of Neurology  Reason for Referral:   Patrick Gregoryis a 60 y.o. right-handed Hispanic male referred by Ellouise Newer, M.D.,to characterize hiscurrent cognitive functioning and assist with diagnostic clarity and treatment planning in the context of subjective cognitive decline, past seizure history, and numerous medical and psychiatric comorbidities.   Feedback:   Patrick Gregory completed a comprehensive neuropsychological evaluation on 10/22/2020. Please refer to that encounter for the full report and recommendations. Unfortunately, scores across stand-alone and embedded performance validity measures were very frequently below expectation. As such, obtained test scores are viewed as invalid and should not be interpreted at face value. With that being said, scores across validity indicators were not below chance as they were during his previous 2018 evaluation. As such, there is not evidence for malingering. It is far more likely that the significance of his psychiatric distress, chronic pain, and fatigue prevented his ability to fully engage in testing.  Patrick Gregory was accompanied by his wife during the current feedback session. Content of the current session focused on the results of his neuropsychological evaluation. Patrick Gregory and his wife were given the opportunity to ask questions and their questions were answered. They were encouraged to reach out should additional questions arise. A copy of his report was provided at the conclusion of the visit.      35 minutes were spent conducting the current feedback session with Patrick Gregory, billed as one unit [phone].

## 2021-04-20 ENCOUNTER — Ambulatory Visit: Payer: Medicare Other | Admitting: Neurology
